# Patient Record
Sex: Female | Born: 1955 | Race: White | Hispanic: No | Smoking: Never smoker
Health system: Southern US, Community
[De-identification: ages and names within clinical notes are randomized; demographics above are authoritative.]

## PROBLEM LIST (undated history)

## (undated) DIAGNOSIS — I2510 Atherosclerotic heart disease of native coronary artery without angina pectoris: Secondary | ICD-10-CM

## (undated) DIAGNOSIS — K219 Gastro-esophageal reflux disease without esophagitis: Secondary | ICD-10-CM

## (undated) DIAGNOSIS — G8929 Other chronic pain: Secondary | ICD-10-CM

## (undated) DIAGNOSIS — E785 Hyperlipidemia, unspecified: Secondary | ICD-10-CM

## (undated) DIAGNOSIS — Z9889 Other specified postprocedural states: Secondary | ICD-10-CM

## (undated) DIAGNOSIS — R112 Nausea with vomiting, unspecified: Secondary | ICD-10-CM

## (undated) DIAGNOSIS — Z5189 Encounter for other specified aftercare: Secondary | ICD-10-CM

## (undated) DIAGNOSIS — I10 Essential (primary) hypertension: Secondary | ICD-10-CM

## (undated) DIAGNOSIS — M1990 Unspecified osteoarthritis, unspecified site: Secondary | ICD-10-CM

## (undated) DIAGNOSIS — M549 Dorsalgia, unspecified: Secondary | ICD-10-CM

## (undated) DIAGNOSIS — R51 Headache: Secondary | ICD-10-CM

## (undated) DIAGNOSIS — R0683 Snoring: Secondary | ICD-10-CM

## (undated) DIAGNOSIS — M4800 Spinal stenosis, site unspecified: Secondary | ICD-10-CM

## (undated) DIAGNOSIS — D649 Anemia, unspecified: Secondary | ICD-10-CM

## (undated) DIAGNOSIS — M353 Polymyalgia rheumatica: Secondary | ICD-10-CM

## (undated) DIAGNOSIS — M8580 Other specified disorders of bone density and structure, unspecified site: Secondary | ICD-10-CM

## (undated) HISTORY — DX: Unspecified osteoarthritis, unspecified site: M19.90

## (undated) HISTORY — DX: Spinal stenosis, site unspecified: M48.00

## (undated) HISTORY — PX: TUBAL LIGATION: SHX77

## (undated) HISTORY — DX: Hyperlipidemia, unspecified: E78.5

## (undated) HISTORY — DX: Essential (primary) hypertension: I10

## (undated) HISTORY — DX: Gastro-esophageal reflux disease without esophagitis: K21.9

## (undated) HISTORY — DX: Anemia, unspecified: D64.9

## (undated) HISTORY — DX: Encounter for other specified aftercare: Z51.89

## (undated) HISTORY — PX: BACK SURGERY: SHX140

## (undated) HISTORY — DX: Polymyalgia rheumatica: M35.3

## (undated) HISTORY — PX: APPENDECTOMY: SHX54

---

## 1999-10-18 ENCOUNTER — Encounter: Admission: RE | Admit: 1999-10-18 | Discharge: 1999-10-18 | Payer: Self-pay | Admitting: Family Medicine

## 1999-10-18 ENCOUNTER — Encounter: Payer: Self-pay | Admitting: Family Medicine

## 2000-01-17 ENCOUNTER — Encounter: Payer: Self-pay | Admitting: Family Medicine

## 2000-01-17 ENCOUNTER — Encounter: Admission: RE | Admit: 2000-01-17 | Discharge: 2000-01-17 | Payer: Self-pay | Admitting: Family Medicine

## 2000-12-27 ENCOUNTER — Encounter: Payer: Self-pay | Admitting: Family Medicine

## 2000-12-27 ENCOUNTER — Encounter: Admission: RE | Admit: 2000-12-27 | Discharge: 2000-12-27 | Payer: Self-pay | Admitting: Family Medicine

## 2003-06-17 ENCOUNTER — Other Ambulatory Visit: Admission: RE | Admit: 2003-06-17 | Discharge: 2003-06-17 | Payer: Self-pay | Admitting: Family Medicine

## 2004-09-06 ENCOUNTER — Other Ambulatory Visit: Admission: RE | Admit: 2004-09-06 | Discharge: 2004-09-06 | Payer: Self-pay | Admitting: Family Medicine

## 2005-01-30 ENCOUNTER — Encounter: Admission: RE | Admit: 2005-01-30 | Discharge: 2005-01-30 | Payer: Self-pay | Admitting: Gastroenterology

## 2006-06-21 ENCOUNTER — Ambulatory Visit (HOSPITAL_COMMUNITY): Admission: RE | Admit: 2006-06-21 | Discharge: 2006-06-21 | Payer: Self-pay | Admitting: Anesthesiology

## 2006-08-31 ENCOUNTER — Encounter: Admission: RE | Admit: 2006-08-31 | Discharge: 2006-08-31 | Payer: Self-pay | Admitting: Family Medicine

## 2006-09-20 ENCOUNTER — Other Ambulatory Visit: Admission: RE | Admit: 2006-09-20 | Discharge: 2006-09-20 | Payer: Self-pay | Admitting: Family Medicine

## 2006-09-23 ENCOUNTER — Encounter: Admission: RE | Admit: 2006-09-23 | Discharge: 2006-09-23 | Payer: Self-pay | Admitting: Family Medicine

## 2006-10-01 ENCOUNTER — Encounter: Admission: RE | Admit: 2006-10-01 | Discharge: 2006-10-01 | Payer: Self-pay | Admitting: Family Medicine

## 2006-10-20 ENCOUNTER — Emergency Department (HOSPITAL_COMMUNITY): Admission: EM | Admit: 2006-10-20 | Discharge: 2006-10-20 | Payer: Self-pay | Admitting: Emergency Medicine

## 2006-10-23 ENCOUNTER — Encounter: Admission: RE | Admit: 2006-10-23 | Discharge: 2006-10-23 | Payer: Self-pay | Admitting: Family Medicine

## 2006-12-23 ENCOUNTER — Encounter: Admission: RE | Admit: 2006-12-23 | Discharge: 2006-12-23 | Payer: Self-pay | Admitting: Family Medicine

## 2008-03-23 IMAGING — US US PELVIS COMPLETE MODIFY
1 series · 14 of 25 positions shown · non-contrast
Comparison: none

CLINICAL DATA: Right adnexal cyst.
 TRANSABDOMINAL AND TRANSVAGINAL PELVIC ULTRASOUND ? 10/01/06 AT 0386 HOURS:

[Series 1: us pelvis complete modify · 0.26mm/px · 14 of 66 slices shown]
[im 1/66]
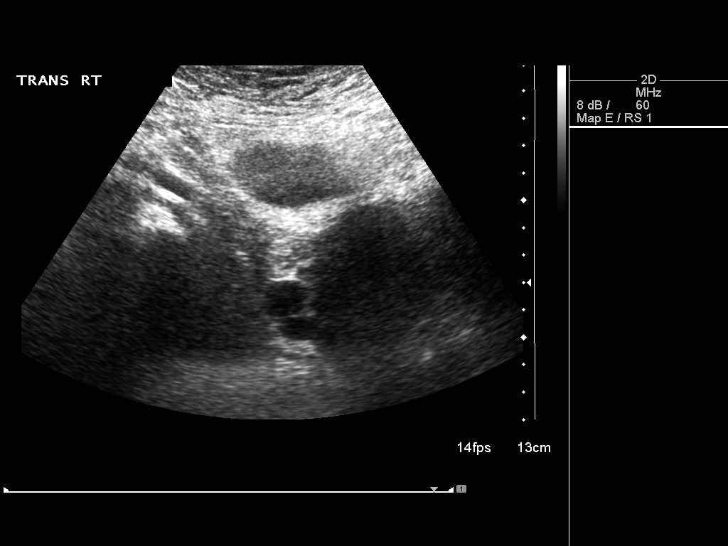
[im 6/66]
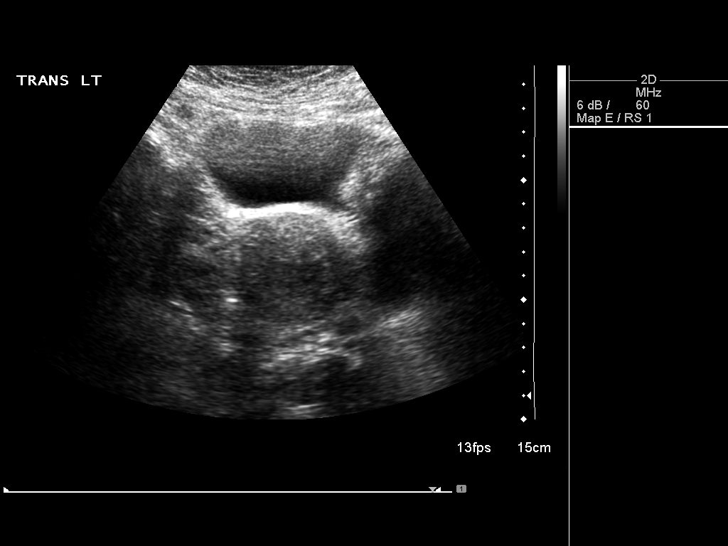
[im 11/66]
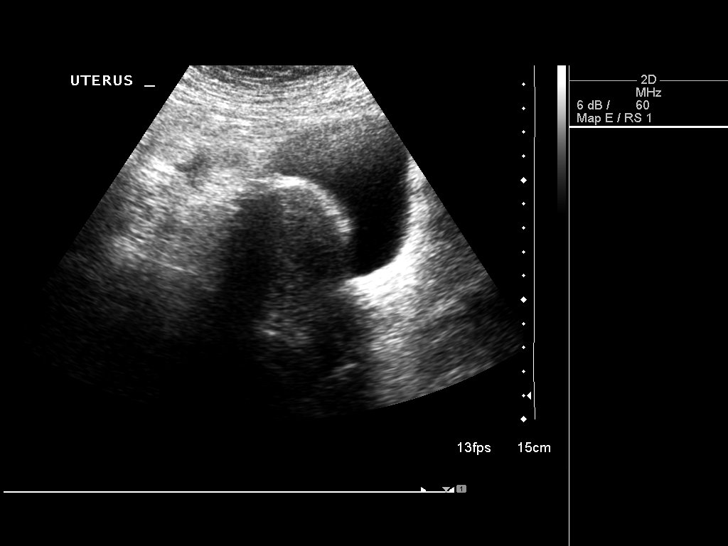
[im 17/66]
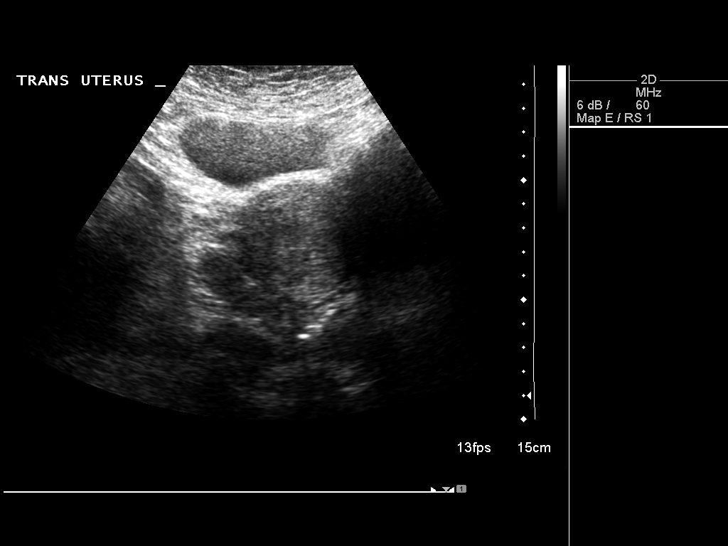
[im 22/66]
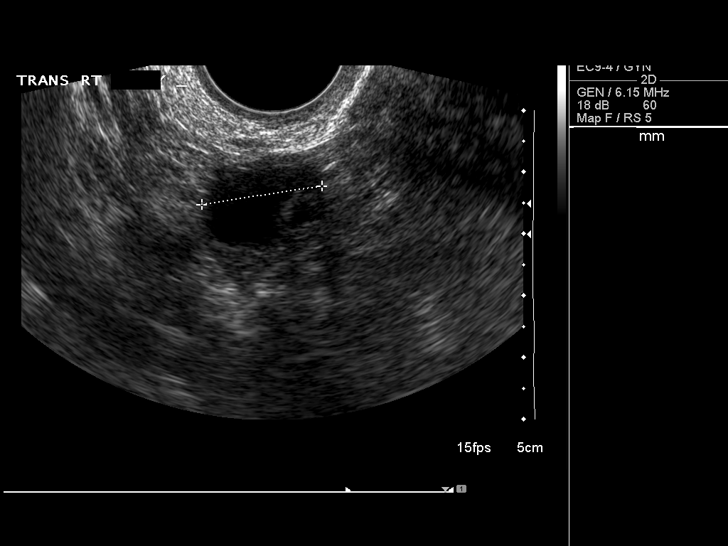
[im 25/66]
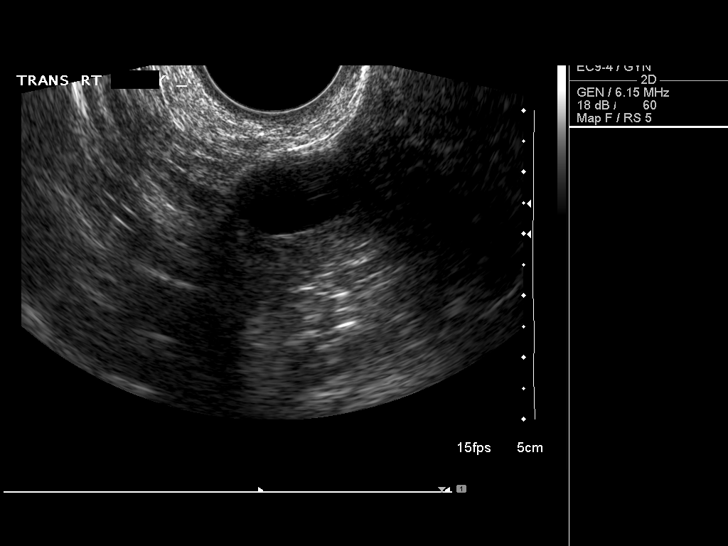
[im 30/66]
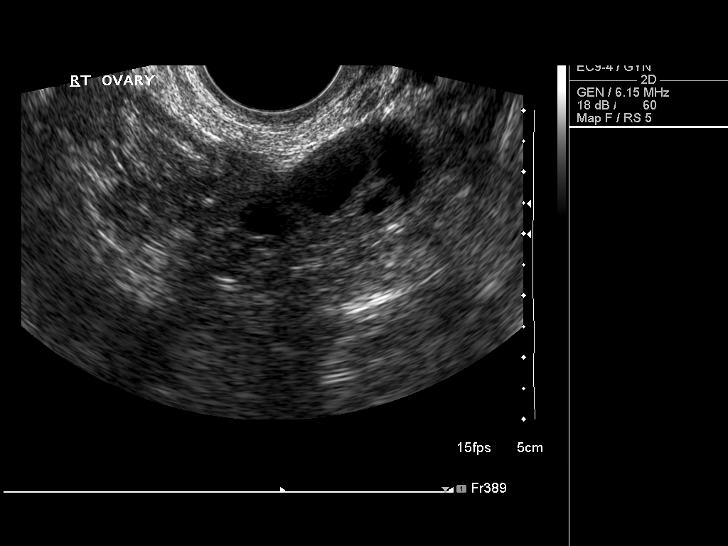
[im 36/66]
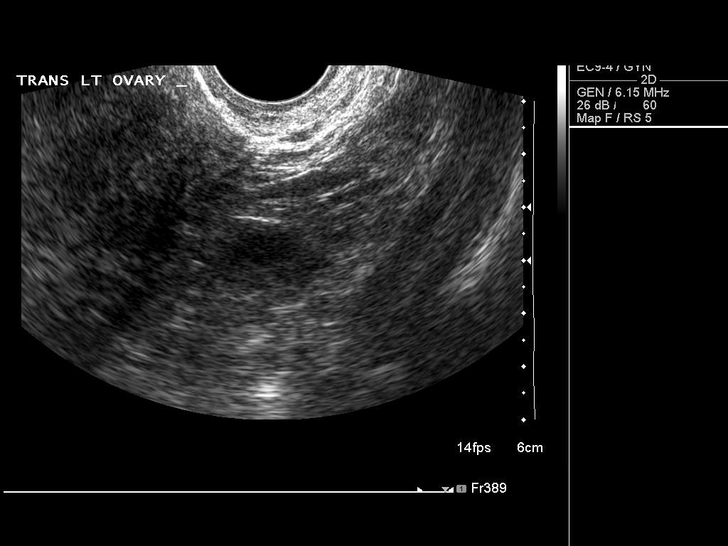
[im 41/66]
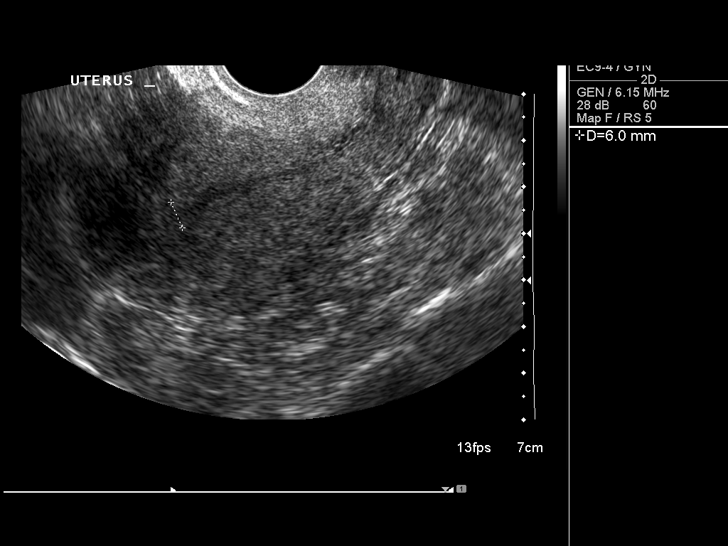
[im 44/66]
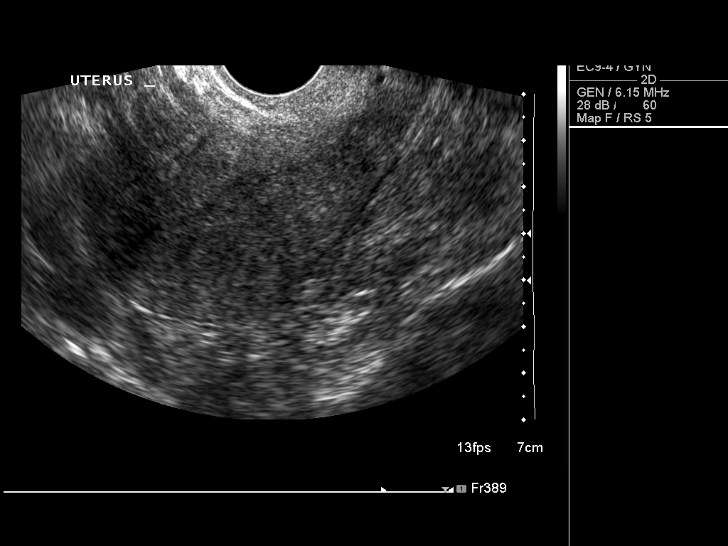
[im 49/66]
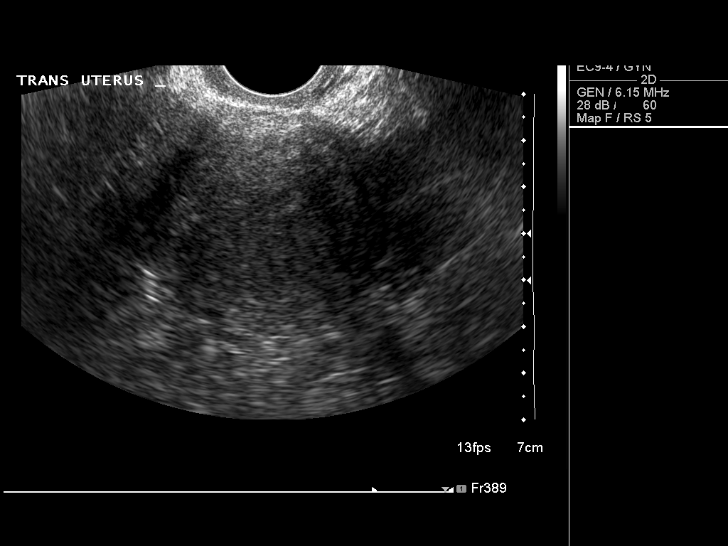
[im 55/66]
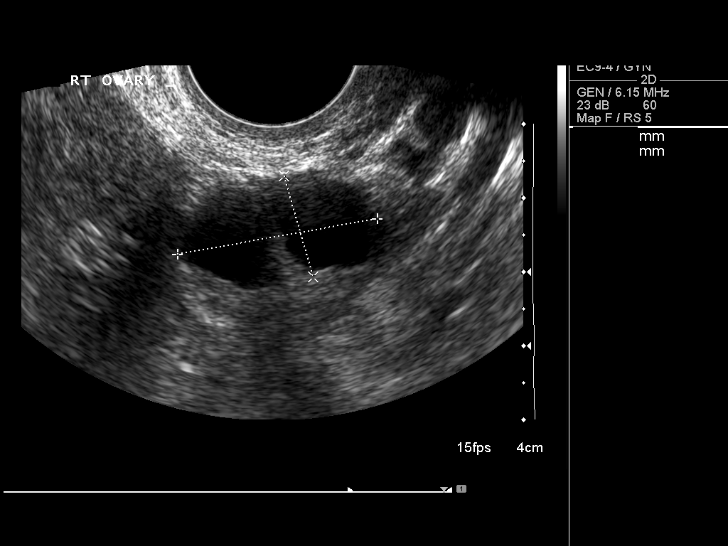
[im 60/66]
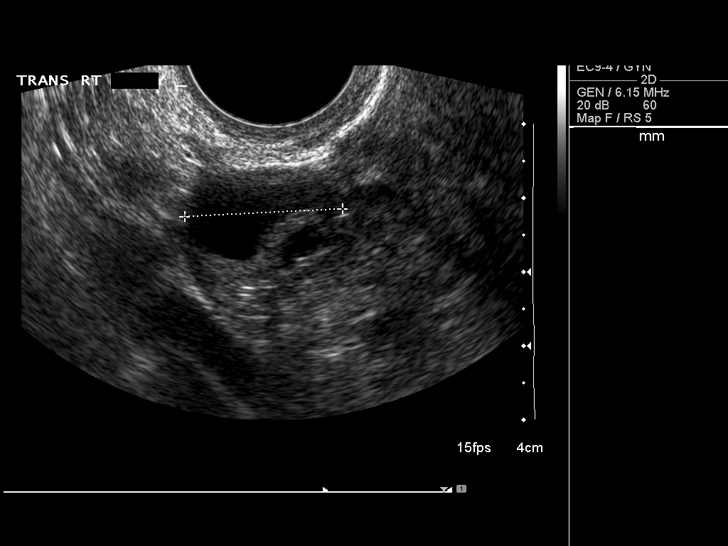
[im 66/66]
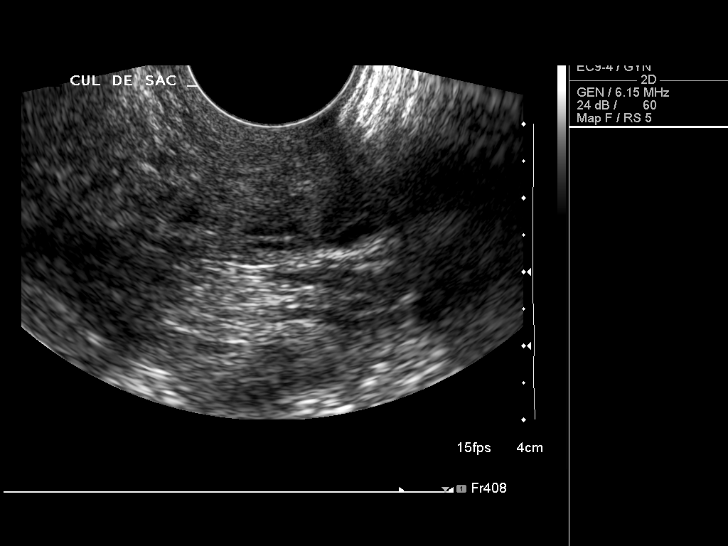

[14 of 25 positions shown; findings below may reference images not displayed]

FINDINGS: The uterus is 9.3 x 4.5 x 5.0 cm.  The endometrium is 6 mm in thickness and uniform.  The left ovary is unremarkable.  There is a septated 2.7 x 1.8 x 2.1 cm right ovarian cyst.  Trace free fluid is present.
IMPRESSION: Complex cyst in the right ovary.  Follow-up study is warranted until resolution.

## 2009-06-10 ENCOUNTER — Encounter: Admission: RE | Admit: 2009-06-10 | Discharge: 2009-06-10 | Payer: Self-pay | Admitting: Family Medicine

## 2010-01-03 ENCOUNTER — Encounter: Admission: RE | Admit: 2010-01-03 | Discharge: 2010-01-03 | Payer: Self-pay | Admitting: Rheumatology

## 2010-01-07 ENCOUNTER — Ambulatory Visit (HOSPITAL_BASED_OUTPATIENT_CLINIC_OR_DEPARTMENT_OTHER): Admission: RE | Admit: 2010-01-07 | Discharge: 2010-01-07 | Payer: Self-pay | Admitting: Family Medicine

## 2010-01-07 ENCOUNTER — Ambulatory Visit: Payer: Self-pay | Admitting: Diagnostic Radiology

## 2010-04-16 ENCOUNTER — Encounter: Payer: Self-pay | Admitting: Family Medicine

## 2010-10-25 DIAGNOSIS — IMO0001 Reserved for inherently not codable concepts without codable children: Secondary | ICD-10-CM

## 2010-10-25 DIAGNOSIS — Z5189 Encounter for other specified aftercare: Secondary | ICD-10-CM

## 2010-10-25 HISTORY — DX: Reserved for inherently not codable concepts without codable children: IMO0001

## 2010-10-25 HISTORY — DX: Encounter for other specified aftercare: Z51.89

## 2010-10-27 ENCOUNTER — Encounter (HOSPITAL_COMMUNITY): Payer: Federal, State, Local not specified - PPO | Attending: Family Medicine

## 2010-10-27 DIAGNOSIS — Z79899 Other long term (current) drug therapy: Secondary | ICD-10-CM | POA: Insufficient documentation

## 2010-10-27 DIAGNOSIS — M353 Polymyalgia rheumatica: Secondary | ICD-10-CM | POA: Insufficient documentation

## 2010-10-27 DIAGNOSIS — D509 Iron deficiency anemia, unspecified: Secondary | ICD-10-CM | POA: Insufficient documentation

## 2010-10-27 DIAGNOSIS — I10 Essential (primary) hypertension: Secondary | ICD-10-CM | POA: Insufficient documentation

## 2010-10-27 DIAGNOSIS — IMO0002 Reserved for concepts with insufficient information to code with codable children: Secondary | ICD-10-CM | POA: Insufficient documentation

## 2010-10-27 DIAGNOSIS — K219 Gastro-esophageal reflux disease without esophagitis: Secondary | ICD-10-CM | POA: Insufficient documentation

## 2010-11-08 ENCOUNTER — Other Ambulatory Visit: Payer: Self-pay | Admitting: Orthopedic Surgery

## 2010-11-08 ENCOUNTER — Other Ambulatory Visit (HOSPITAL_COMMUNITY): Payer: Self-pay | Admitting: Orthopedic Surgery

## 2010-11-08 ENCOUNTER — Ambulatory Visit (HOSPITAL_COMMUNITY)
Admission: RE | Admit: 2010-11-08 | Discharge: 2010-11-08 | Disposition: A | Discharge status: Home / Self Care | Payer: Federal, State, Local not specified - PPO | Source: Ambulatory Visit | Attending: Orthopedic Surgery | Admitting: Orthopedic Surgery

## 2010-11-08 ENCOUNTER — Encounter (HOSPITAL_COMMUNITY): Payer: Federal, State, Local not specified - PPO

## 2010-11-08 DIAGNOSIS — Z01811 Encounter for preprocedural respiratory examination: Secondary | ICD-10-CM

## 2010-11-08 DIAGNOSIS — Z79899 Other long term (current) drug therapy: Secondary | ICD-10-CM | POA: Insufficient documentation

## 2010-11-08 DIAGNOSIS — M169 Osteoarthritis of hip, unspecified: Secondary | ICD-10-CM | POA: Insufficient documentation

## 2010-11-08 DIAGNOSIS — Z01810 Encounter for preprocedural cardiovascular examination: Secondary | ICD-10-CM | POA: Insufficient documentation

## 2010-11-08 DIAGNOSIS — K219 Gastro-esophageal reflux disease without esophagitis: Secondary | ICD-10-CM | POA: Insufficient documentation

## 2010-11-08 DIAGNOSIS — I10 Essential (primary) hypertension: Secondary | ICD-10-CM | POA: Insufficient documentation

## 2010-11-08 DIAGNOSIS — M1610 Unilateral primary osteoarthritis, unspecified hip: Secondary | ICD-10-CM | POA: Insufficient documentation

## 2010-11-08 DIAGNOSIS — Z01812 Encounter for preprocedural laboratory examination: Secondary | ICD-10-CM | POA: Insufficient documentation

## 2010-11-08 DIAGNOSIS — Z01818 Encounter for other preprocedural examination: Secondary | ICD-10-CM | POA: Insufficient documentation

## 2010-11-08 DIAGNOSIS — M353 Polymyalgia rheumatica: Secondary | ICD-10-CM | POA: Insufficient documentation

## 2010-11-08 LAB — BASIC METABOLIC PANEL
BUN: 24 mg/dL — ABNORMAL HIGH (ref 6–23)
CO2: 27 mEq/L (ref 19–32)
Calcium: 11.4 mg/dL — ABNORMAL HIGH (ref 8.4–10.5)
Chloride: 102 mEq/L (ref 96–112)
Creatinine, Ser: 1.14 mg/dL — ABNORMAL HIGH (ref 0.50–1.10)
GFR calc Af Amer: 60 mL/min — ABNORMAL LOW (ref >60)
GFR calc non Af Amer: 49 mL/min — ABNORMAL LOW (ref >60)
Glucose, Bld: 109 mg/dL — ABNORMAL HIGH (ref 70–99)
Potassium: 5.4 mEq/L — ABNORMAL HIGH (ref 3.5–5.1)
Sodium: 139 mEq/L (ref 135–145)

## 2010-11-08 LAB — URINALYSIS, ROUTINE W REFLEX MICROSCOPIC
Bilirubin Urine: NEGATIVE
Glucose, UA: NEGATIVE mg/dL
Hgb urine dipstick: NEGATIVE
Ketones, ur: NEGATIVE mg/dL
Leukocytes, UA: NEGATIVE
Nitrite: NEGATIVE
Protein, ur: NEGATIVE mg/dL
Specific Gravity, Urine: 1.021 (ref 1.005–1.030)
Urobilinogen, UA: 0.2 mg/dL (ref 0.0–1.0)
pH: 6.5 (ref 5.0–8.0)

## 2010-11-08 LAB — DIFFERENTIAL
Basophils Absolute: 0 10*3/uL (ref 0.0–0.1)
Basophils Relative: 0 % (ref 0–1)
Eosinophils Absolute: 0 10*3/uL (ref 0.0–0.7)
Neutro Abs: 7 10*3/uL (ref 1.7–7.7)
Neutrophils Relative %: 71 % (ref 43–77)

## 2010-11-08 LAB — PROTIME-INR: INR: 0.98 (ref 0.00–1.49)

## 2010-11-08 LAB — CBC
Hemoglobin: 10.4 g/dL — ABNORMAL LOW (ref 12.0–15.0)
MCH: 31 pg (ref 26.0–34.0)
MCHC: 32.9 g/dL (ref 30.0–36.0)
Platelets: 375 10*3/uL (ref 150–400)
RDW: 13.1 % (ref 11.5–15.5)

## 2010-11-08 LAB — ABO/RH: ABO/RH(D): B POS

## 2010-11-08 LAB — APTT: aPTT: 29 seconds (ref 24–37)

## 2010-11-14 ENCOUNTER — Inpatient Hospital Stay (HOSPITAL_COMMUNITY): Payer: Federal, State, Local not specified - PPO

## 2010-11-14 ENCOUNTER — Inpatient Hospital Stay (HOSPITAL_COMMUNITY)
Admission: RE | Admit: 2010-11-14 | Discharge: 2010-11-16 | DRG: 818 | Disposition: A | Discharge status: Home Health | Payer: Federal, State, Local not specified - PPO | Source: Ambulatory Visit | Attending: Orthopedic Surgery | Admitting: Orthopedic Surgery

## 2010-11-14 DIAGNOSIS — IMO0002 Reserved for concepts with insufficient information to code with codable children: Secondary | ICD-10-CM

## 2010-11-14 DIAGNOSIS — Z9851 Tubal ligation status: Secondary | ICD-10-CM

## 2010-11-14 DIAGNOSIS — M169 Osteoarthritis of hip, unspecified: Principal | ICD-10-CM | POA: Diagnosis present

## 2010-11-14 DIAGNOSIS — I10 Essential (primary) hypertension: Secondary | ICD-10-CM | POA: Diagnosis present

## 2010-11-14 DIAGNOSIS — Z79899 Other long term (current) drug therapy: Secondary | ICD-10-CM

## 2010-11-14 DIAGNOSIS — M353 Polymyalgia rheumatica: Secondary | ICD-10-CM | POA: Diagnosis present

## 2010-11-14 DIAGNOSIS — K219 Gastro-esophageal reflux disease without esophagitis: Secondary | ICD-10-CM | POA: Diagnosis present

## 2010-11-14 DIAGNOSIS — M1610 Unilateral primary osteoarthritis, unspecified hip: Principal | ICD-10-CM | POA: Diagnosis present

## 2010-11-14 DIAGNOSIS — Z01812 Encounter for preprocedural laboratory examination: Secondary | ICD-10-CM

## 2010-11-14 DIAGNOSIS — Z01810 Encounter for preprocedural cardiovascular examination: Secondary | ICD-10-CM

## 2010-11-14 DIAGNOSIS — D649 Anemia, unspecified: Secondary | ICD-10-CM | POA: Diagnosis present

## 2010-11-14 HISTORY — PX: JOINT REPLACEMENT: SHX530

## 2010-11-15 LAB — CBC
HCT: 21.4 % — ABNORMAL LOW (ref 36.0–46.0)
Hemoglobin: 7.2 g/dL — ABNORMAL LOW (ref 12.0–15.0)
MCV: 93.4 fL (ref 78.0–100.0)
WBC: 11.3 10*3/uL — ABNORMAL HIGH (ref 4.0–10.5)

## 2010-11-15 LAB — BASIC METABOLIC PANEL
BUN: 13 mg/dL (ref 6–23)
Chloride: 106 mEq/L (ref 96–112)
Creatinine, Ser: 0.69 mg/dL (ref 0.50–1.10)
Glucose, Bld: 150 mg/dL — ABNORMAL HIGH (ref 70–99)
Potassium: 4.2 mEq/L (ref 3.5–5.1)

## 2010-11-16 LAB — TYPE AND SCREEN
ABO/RH(D): B POS
Unit division: 0

## 2010-11-16 LAB — BASIC METABOLIC PANEL
BUN: 12 mg/dL (ref 6–23)
Calcium: 9.6 mg/dL (ref 8.4–10.5)
Creatinine, Ser: 0.69 mg/dL (ref 0.50–1.10)
GFR calc Af Amer: >60 mL/min (ref >60)

## 2010-11-16 LAB — CBC
HCT: 27.8 % — ABNORMAL LOW (ref 36.0–46.0)
MCH: 29.9 pg (ref 26.0–34.0)
MCV: 89.4 fL (ref 78.0–100.0)
Platelets: 217 10*3/uL (ref 150–400)
RDW: 17.4 % — ABNORMAL HIGH (ref 11.5–15.5)

## 2010-11-17 NOTE — Op Note (Signed)
NAMEWRENLEY, [name]                   ACCOUNT NO.:  [id number]  MEDICAL RECORD NO.:  [id number]  LOCATION:  1601                         FACILITY:  [facility]  PHYSICIAN:  Madlyn Frankel. Charlann Boxer, M.D.  DATE OF BIRTH:  09/11/1955  DATE OF PROCEDURE:  11/14/2010 DATE OF DISCHARGE:                              OPERATIVE REPORT   PREOPERATIVE DIAGNOSIS:  Right hip osteoarthritis.  POSTOPERATIVE DIAGNOSIS:  Right hip osteoarthritis.  PROCEDURE:  Right total hip replacement through an anterior approach.  COMPONENTS USED:  DePuy hip system size 50 Pinnacle cup, single cancellous screw, 32 +4 neutral AltrX liner, and size 1 high Tri-Lock stem with a 32 +5 Delta ceramic ball.  SURGEON:  Madlyn Frankel. Charlann Boxer, MD  ASSISTANT:  Lanney Gins, PA-C  ANESTHESIA:  General.  BLOOD LOSS:  About 650 cc.  DRAINS:  One Hemovac.  COMPLICATIONS:  None apparent.  INDICATIONS FOR PROCEDURE:  Ms. Pollman is a 55 year old female who presented to the office for evaluation of right hip pain.  She had been having problems for a very long time.  Radiographs revealed end-stage bone on bone arthritis with significant collapse of femoral head, consistent with more advanced degenerative changes with both avascular process.  After reviewing with her surgical options of the hip arthroplasty, risks of infection, DVT, component failure, need for revision surgery were discussed as well as the current topics of posterior versus anterior approach.  Following this discussion, she wished at this point to proceed with a right total hip through an anterior approach.  Consent was obtained for benefit of pain relief.  PROCEDURE IN DETAIL:  The patient was brought to operative theater. Once adequate anesthesia, preoperative antibiotics, Ancef administered, she was positioned supine on the OSI Hana table with the right arm abducted across her chest and padded.  Fluoroscopy was then brought in to confirm anatomy and position.   The right hip was then pre-draped out with plastic drape.  The right hip was then prepped and draped in sterile fashion using shower-curtain technique, identifying the anterior-superior iliac spine location of incision.  Time-out was performed identifying the patient, planned procedure, and extremity.  Lateral based incision was made 2 cm lateral distal to the anterior-superior iliac spine.  Sharp dissection was carried to the fascia with tensor fascia lata.  Soft tissue retractor was placed.  The fascia was then incised and the muscle belly swept laterally and superior neck retractor placed.  The inferior neck was identified.  At this point, identified that she had a significantly affected degenerative joint as she has significant adherent joint capsule.  I basically excised the joint at this point of the capsule as much anteriorly as possible to evaluate the joint.  Flaps were not was saved.  With this, the retractors were placed around the neck intracapsularly. I then pulled traction on the hip and locked it down.  Fluoroscopy was used to confirm location of a neck cut.  The neck osteotomy was made and femoral head removed.  At this point, attention was directed to further debridement the anterior capsule and labrum and soft tissues to allow for exposure purposes.  The retractors were placed posterior and one  anterior.  I began reaming with a 43 reamer, reamed up to 49 reamer with good bony bed preparation.  The last 2 reamers done under fluoroscopic imaging to confirm depth of reaming and location and position.  A final 50 Pinnacle cup was chosen and with a curved impactor was impacted under fluoroscopic imaging to maintain the correct level of abduction.  I could palpate the anterior rim anteriorly, confirming the level of anteversion.  At this point, the single cancellous screw was placed into the ilium to support the initial scratch fit.  Hole eliminator was placed and a  final 32 +4 neutral AltrX liner impacted.  Attention was now directed to the femur.  The lateral hook was applied. The femur was rotated to 100 degrees as I removed some of the inferior capsular tissue further.  I then went ahead and placed a retractor posteriorly along the trochanter and the leg was then extended and abducted.  Further capsular tissues removed off the posterior trochanter.  I then used a box osteotome and opened up the proximal femur.  I began broaching with a starting broach and passed this by hand and then broached up to a 0 and then evaluated the position of the component in the femur in AP and lateral planes.  Once I was satisfied with orientation, I broached up to 1 and into a size 2 broach.  Then 2 broach initially sat at the level of my neck cut when I did a trial reduction of high offset neck.  There appeared to be good fit and filled the prosthesis, however, this operative leg at this point was longer than left leg, the nonoperative side radiographically.  At this point, we dislocated the hip, replaced the retractors.  I removed the 2 broach and impacted the 1 broach until it was well seated. We used a calcar planer to remove some of the medial remaining neck.  A trial reduction was now carried out with 1 standard at 32 +1 ball.  With this, the leg lengths now appeared to be very equal.  For this reason, the trial components were removed.  The final 1 high Tri-Lock stem was chosen based on the level of my trial reduction where the stem sat, I chose to use a +5 ball to restore length and help of the offset.  The final 32 +5 Delta ceramic ball was chosen and impacted onto clean and dry trunnion and the hip was reduced.  The hip had been irrigated throughout the case and again at this point. A medium Hemovac drain was placed deep.  The fascia of the tensor fascia lata muscle was reapproximated using #1 Vicryl.  The remainder of the wound was closed with  2-0 Vicryl and running 4-0 Monocryl.  The hip was cleaned, dried, and dressed sterilely using Dermabond and Aquacel dressing. Drain site dressed separately.  The patient was brought to recovery room in stable condition tolerating the procedure well.     Madlyn Frankel Charlann Boxer, M.D.     MDO/MEDQ  D:  11/14/2010  T:  11/15/2010  Job:  [id number]  Electronically Signed by Durene Romans M.D. on 11/17/2010 07:57:55 AM

## 2010-11-20 NOTE — H&P (Signed)
NAMELYLY, CANIZALES                   ACCOUNT NO.:  [id number]  MEDICAL RECORD NO.:  [id number]  LOCATION:                                 FACILITY:  PHYSICIAN:  Madlyn Frankel. Charlann Boxer, M.D.  DATE OF BIRTH:  05/21/1955  DATE OF ADMISSION: DATE OF DISCHARGE:                             HISTORY & PHYSICAL   DATE OF SURGERY:  November 21, 2010  ADMISSION DIAGNOSIS:  Right hip osteoarthritis.  HISTORY OF PRESENT ILLNESS:  This is a 55 year old lady with a history of osteoarthritis of her right hip with failure of conserve treatment to alleviate her symptoms.  After discussion of treatment, benefits, risks and options, the patient is now scheduled for anterior right total hip arthroplasty.  Note that the patient is a candidate for tranexamic acid and will receive that per protocol.  Also note that she takes 10 mg of prednisone daily for her polymyalgia rheumatica and will need a booster dose of cortisone preoperatively.  She is given her home medications of aspirin, Robaxin, iron, MiraLax and Colace for postop and she is to stop her Flexeril postoperatively and take the Robaxin instead so she will not get as much sedation when she is taking pain medications.  Also note that she does have a strong family history of sleep apnea.  She herself does not have sleep apnea, but we should put her on a continuous pulse ox postoperatively as a precaution.  PAST MEDICAL HISTORY:  Drug allergies to [facility] with chest pain.  CURRENT MEDICATIONS: 1. Lisinopril. 2. Hydrochlorothiazide 10/12.5 one q.a.m. 3. Prevacid 30 mg q.a.m. 4. Norco q.6 p.r.n. 5. Flexeril 10 mg q.8 h. p.r.n. 6. Vitamin D3 2000 units daily. 7. Magnesium 400 mg daily. 8. Flaxseed oil 1200 mg daily. 9. Prednisone 10 mg daily.  SERIOUS MEDICAL ILLNESS:  Include, 1. Hypertension, . 2. Reflux. 3. Polymyalgia rheumatica.  PREVIOUS SURGERIES:  Include, 1. Appendectomy. 2. Tubal ligation and back surgery x2.  SERIOUS FAMILY  HISTORY:  Positive for cancer, coronary artery disease.  SOCIAL HISTORY:  The patient is widowed.  She is retired.  She does not smoke and does not drink.  She will be going home after surgery and staying with her daughter.  REVIEW OF SYSTEMS:  CENTRAL NERVOUS SYSTEM: Positive for occasional dizziness and tinnitus.  PULMONARY:  Negative for shortness breath, PND, and orthopnea.  CARDIOVASCULAR:  Negative for chest pain or palpitation. GI: Negative for ulcers or hepatitis.  Positive for heartburn and reflux.  GU: Negative for urinary tract difficulties other than occasional incontinence.  MUSCULOSKELETAL:  Positives in HPI.  PHYSICAL EXAM:  VITAL SIGNS:  BP 100/70, respirations 16, pulse 80 and regular. GENERAL APPEARANCE:  This is a well-developed, well-nourished lady in no acute distress. HEENT:  Head normocephalic.  Nose patent.  Ears patent.  Pupils equal, round, and reactive light.  Throat without injection. NECK:  Supple without adenopathy.  Carotids 2+ without bruit. CHEST:  Clear to auscultation.  No rales or rhonchi.  Respirations 16. HEART:  Regular rate and rhythm at 80 beats without murmur. ABDOMEN:  Soft, active bowel sounds.  No mass, organomegaly. NEUROLOGIC:  The patient alert and oriented to  time, place and person. Cranial nerves II-XII grossly intact. EXTREMITY:  Shows right hip with decreased range of motion with pain to any internal external rotation.  Neurovascular status intact.  IMPRESSION:  Right hip osteoarthritis.  PLAN:  Right hip total hip arthroplasty by anterior approach.     Jaquelyn Bitter. Chabon, P.A.   ______________________________ Madlyn Frankel Charlann Boxer, M.D.    SJC/MEDQ  D:  11/08/2010  T:  11/09/2010  Job:  [id number]  Electronically Signed by Jodene Nam P.A. on 11/19/2010 08:46:48 AM Electronically Signed by Durene Romans M.D. on 11/20/2010 07:42:39 AM

## 2010-11-20 NOTE — Discharge Summary (Signed)
[name], [name]                   ACCOUNT NO.:  [id number]  MEDICAL RECORD NO.:  [id number]  LOCATION:  1601                         FACILITY:  [facility]  PHYSICIAN:  Amanda Frankel. Charlann Mckenzie, M.D.  DATE OF BIRTH:  [birth year]  DATE OF ADMISSION:  11/14/2010 DATE OF DISCHARGE:  11/16/2010                              DISCHARGE SUMMARY   PROCEDURE:  Right total hip replacement.  ADMITTING DIAGNOSIS:  Right hip osteoarthritis.  DISCHARGE DIAGNOSES: 1. Status post right total hip arthroplasty. 2. Hypertension. 3. Reflux. 4. Polymyalgia rheumatica.  HISTORY OF PRESENT ILLNESS:  The patient is a 55 year old lady with a history of osteoarthritis of her right hip with failure of conservative treatment to alleviate her symptoms.  X-rays in the clinic did show arthritic changes within the right hip.  Various options were discussed with the patient.  The patient wishes to proceed with surgery.  Risks, benefits, and expectations of procedure were discussed with the patient. The patient understands risks, benefits, and expectations, and wishes to proceed with a right total hip arthroplasty per Dr. Charlann Mckenzie.  HOSPITAL COURSE:  The patient underwent the above-stated procedure on November 14, 2010.  The patient tolerated the procedure well and was brought to the recovery room in good condition and subsequently to the floor.  On  postop day #1, November 15, 2010, the patient doing well, no incidences, pain controlled well, afebrile, vital signs stable. Hemoglobin and hematocrit was 7.2/21.4.  Dressings good.  Clean, dry, and intact.  Hemovac was removed.  The patient was transfused with 2 units of packed red blood cells.  Postop day #2, November 16, 2010, the patient doing well, on no incidences.  The patient's pain was still well controlled, afebrile, vital signs stable.  Hemoglobin and hematocrit is 9.3/27.8.  The patient distally neurovascularly intact.  Dressing was good.  The patient was seen by  Physical Therapy, OT.  The patient was felt to be doing well enough to be discharged home today with home health PT.  DISCHARGE CONDITION:  Good.  DISCHARGE INSTRUCTIONS:  The patient will be discharged home with home health PT.  The patient will be weightbearing as tolerated.  The patient will maintain surgical dressing for about 7 days after which time she will be replaced with gauze and tape.  The patient should keep the area dry and clean until followup.  The patient will follow up with Dr. Charlann Mckenzie, [facility] in 2 weeks.  The patient is to call with any questions or concerns.  DISCHARGE MEDICATIONS: 1. Enteric-coated aspirin 325 mg 1 p.o. b.i.d. for 4 weeks. 2. Colace 100 mg 1 p.o. b.i.d. 3. Iron sulfate 325 mg 1 p.o. t.i.d. 4. Norco 7.5/325 mg 1 to 2 p.o. q.4-6 h. p.r.n. pain. 5. MiraLax 17 g 1 p.o. daily p.r.n. constipation. 6. Robaxin 500 mg 1 p.o. q.6 h. p.r.n. muscle spasms. 7. Benadryl 25 mg 2 capsules by mouth p.o. q.h.s. p.r.n. insomnia. 8. Flaxseed oil 1200 g daily. 9. Lansoprazole 30 mg 1 p.o. q.a.m.10.Lisinopril/HCTZ 10/12.5 mg 1 p.o. q.a.m. 11.Magnesium oxide 400 mg 1 p.o. daily 12.Prednisone 10 mg 1 p.o. q.a.m. 13.Zolpidem 10 mg 1 p.o. q.h.s. p.r.n. insomnia. 14.Vitamin D3  2000 units 1 p.o. daily.    ______________________________ Amanda Gins, PA   ______________________________ Amanda Frankel. Charlann Mckenzie, M.D.    MB/MEDQ  D:  11/16/2010  T:  11/16/2010  Job:  [id number]  Electronically Signed by Amanda Gins PA on 11/17/2010 09:27:50 AM Electronically Signed by Durene Romans M.D. on 11/20/2010 07:42:45 AM

## 2011-02-22 ENCOUNTER — Other Ambulatory Visit: Payer: Self-pay | Admitting: Family Medicine

## 2011-02-22 DIAGNOSIS — Z1231 Encounter for screening mammogram for malignant neoplasm of breast: Secondary | ICD-10-CM

## 2011-03-21 ENCOUNTER — Ambulatory Visit
Admission: RE | Admit: 2011-03-21 | Discharge: 2011-03-21 | Disposition: A | Discharge status: Home / Self Care | Payer: Federal, State, Local not specified - PPO | Source: Ambulatory Visit | Attending: Family Medicine | Admitting: Family Medicine

## 2011-03-21 DIAGNOSIS — Z1231 Encounter for screening mammogram for malignant neoplasm of breast: Secondary | ICD-10-CM

## 2011-07-04 ENCOUNTER — Encounter (INDEPENDENT_AMBULATORY_CARE_PROVIDER_SITE_OTHER): Payer: Self-pay | Admitting: General Surgery

## 2011-07-05 ENCOUNTER — Encounter (INDEPENDENT_AMBULATORY_CARE_PROVIDER_SITE_OTHER): Payer: Self-pay | Admitting: General Surgery

## 2011-07-05 ENCOUNTER — Ambulatory Visit (INDEPENDENT_AMBULATORY_CARE_PROVIDER_SITE_OTHER): Payer: Federal, State, Local not specified - PPO | Admitting: General Surgery

## 2011-07-05 VITALS — BP 112/78 | HR 80 | Temp 97.6°F | Resp 16 | Ht 60.0 in | Wt 198.4 lb

## 2011-07-05 DIAGNOSIS — K8020 Calculus of gallbladder without cholecystitis without obstruction: Secondary | ICD-10-CM | POA: Insufficient documentation

## 2011-07-05 NOTE — Progress Notes (Signed)
Patient ID: [name], female   DOB: September 20, 1955, 56 y.o.   MRN: [id number]  Chief Complaint  Patient presents with  . Abdominal Pain    new pt- eval gallstones    HPI [name] is a 56 y.o. female.  She is referred by Dr. Marinda Elk for evaluation of symptomatic gallstones.  This patient has polymyalgia rheumatica on low-dose prednisone, osteoarthritis, spinal stenosis, hypertension, DJD and obesity.  For at least one year she's been having intermittent attacks of right upper quadrant pain. This is clearly brought on by meals. Nausea is minimal. She is constipated as she takes Norco chronically. No diarrhea. No fever. She has no history of liver disease jaundice or hepatitis. She had a total hip replacement in August 2012 and recovered very nicely from that.  Gallbladder ultrasound was performed at [facility] ultrasound. This shows multiple mobile gallstones. The largest is 8 mm. There is no inflammatory change. There is a little tenderness with the ultrasound probe. The liver looked normal.  She is basically asymptomatic today. She is hopeful that she will be able to come off of prednisone at some point. HPI  Past Medical History  Diagnosis Date  . Arthritis   . Hypertension   . Polymyalgia rheumatica   . Polymyalgia rheumatica   . Spinal stenosis   . Anemia   . Blood transfusion   . GERD (gastroesophageal reflux disease)   . Hyperlipidemia   . Gallstones     Past Surgical History  Procedure Date  . Appendectomy   . Tubal ligation   . Joint replacement 10/2010    R hip replacement  . Back surgery 1990, 1992    Family History  Problem Relation Age of Onset  . Kidney disease Mother   . Heart disease Father   . Cancer Sister     breast, bladder, cervical, brain  . Cancer Brother     lung    Social History History  Substance Use Topics  . Smoking status: Never Smoker   . Smokeless tobacco: Not on file  . Alcohol Use: No    Allergies  Allergen Reactions  .  Imitrex (Sumatriptan Base)     Current Outpatient Prescriptions  Medication Sig Dispense Refill  . alendronate (FOSAMAX) 70 MG tablet Take 70 mg by mouth every 7 (seven) days. Take with a full glass of water on an empty stomach.      Marland Kitchen atorvastatin (LIPITOR) 20 MG tablet Take 20 mg by mouth daily.      . cholecalciferol (VITAMIN D) 1000 UNITS tablet Take 1,000 Units by mouth 2 (two) times daily.      . cyclobenzaprine (FLEXERIL) 10 MG tablet Take 10 mg by mouth 3 (three) times daily as needed.      Marland Kitchen HYDROcodone-acetaminophen (NORCO) 7.5-325 MG per tablet Take 1 tablet by mouth every 6 (six) hours as needed.      . lansoprazole (PREVACID) 30 MG capsule Take 30 mg by mouth daily.      Marland Kitchen lisinopril-hydrochlorothiazide (PRINZIDE,ZESTORETIC) 10-12.5 MG per tablet Take 1 tablet by mouth daily.      . magnesium oxide (MAG-OX) 400 MG tablet Take 400 mg by mouth daily.      . predniSONE (DELTASONE) 10 MG tablet Take 10 mg by mouth every Tuesday, Thursday, Saturday, and Sunday.        Review of Systems Review of Systems  Constitutional: Negative for fever, chills and unexpected weight change.  HENT: Negative for hearing loss, congestion, sore  throat, trouble swallowing and voice change.   Eyes: Negative for visual disturbance.  Respiratory: Negative for cough and wheezing.   Cardiovascular: Negative for chest pain, palpitations and leg swelling.  Gastrointestinal: Positive for nausea, abdominal pain and constipation. Negative for vomiting, diarrhea, blood in stool, abdominal distention and anal bleeding.  Genitourinary: Negative for hematuria, vaginal bleeding and difficulty urinating.  Musculoskeletal: Positive for myalgias and arthralgias.  Skin: Negative for rash and wound.  Neurological: Negative for seizures, syncope and headaches.  Hematological: Negative for adenopathy. Does not bruise/bleed easily.  Psychiatric/Behavioral: Negative for confusion.    Blood pressure 112/78, pulse 80,  temperature 97.6 F (36.4 C), temperature source Temporal, resp. rate 16, height 5' (1.524 m), weight 198 lb 6.4 oz (89.994 kg).  Physical Exam Physical Exam  Constitutional: She is oriented to person, place, and time. She appears well-developed and well-nourished. No distress.  HENT:  Head: Normocephalic and atraumatic.  Nose: Nose normal.  Mouth/Throat: No oropharyngeal exudate.  Eyes: Conjunctivae and EOM are normal. Pupils are equal, round, and reactive to light. Left eye exhibits no discharge. No scleral icterus.  Neck: Neck supple. No JVD present. No tracheal deviation present. No thyromegaly present.  Cardiovascular: Normal rate, regular rhythm, normal heart sounds and intact distal pulses.   No murmur heard. Pulmonary/Chest: Effort normal and breath sounds normal. No respiratory distress. She has no wheezes. She has no rales. She exhibits no tenderness.  Abdominal: Soft. Bowel sounds are normal. She exhibits no distension and no mass. There is no tenderness. There is no rebound and no guarding.    Musculoskeletal: She exhibits no edema and no tenderness.  Lymphadenopathy:    She has no cervical adenopathy.  Neurological: She is alert and oriented to person, place, and time. She exhibits normal muscle tone. Coordination normal.  Skin: Skin is warm. No rash noted. She is not diaphoretic. No erythema. No pallor.  Psychiatric: She has a normal mood and affect. Her behavior is normal. Judgment and thought content normal.    Data Reviewed I reviewed her ultrasound report, Dr. Pablo Lawrence office notes, and lab work.  Assessment    Chronic cholecystitis with cholelithiasis. She has fairly typical recurrent biliary colic. She would benefit from cholecystectomy to control her symptoms and prevent complications in the future.  Polymyalgia rheumatica on low-dose prednisone  Obesity  Degenerative joint disease  Hypertension  Hyperlipidemia  Spinal stenosis.  GERD. Controlled with  Prevacid.    Plan    Scheduled for laparoscopic cholecystectomy with cholangiogram, possible open.  We will give one dose of steroids in the OR as a stress dose.  I have discussed the indications and details of cholecystectomy with her. Techniques have been outlined. Risks have been discussed, including but not limited to bleeding, infection, conversion to open laparotomy, bile leak, injury to adjacent organs such as bile duct or intestine, cardiac, pulmonary, thromboembolic problems, wound healing problems, and other unforseen  issues. She understands these issues. Her questions are answered. She agrees with this plan.       Angelia Mould. Derrell Lolling, M.D., [facility] General and Minimally invasive Surgery Breast and Colorectal Surgery Office:   [phone] Pager:   [phone]  07/05/2011, 10:06 AM

## 2011-07-05 NOTE — Patient Instructions (Signed)
You will be scheduled for a laparoscopic cholecystectomy with cholangiogram, possible open.  I ask you to call Dr. Foy Guadalajara to see if he wants to modify any of your medications preop. Otherwise I did not need medical clearance for your surgery.    Laparoscopic Cholecystectomy Laparoscopic cholecystectomy is surgery to remove the gallbladder. The gallbladder is located slightly to the right of center in the abdomen, behind the liver. It is a concentrating and storage sac for the bile produced in the liver. Bile aids in the digestion and absorption of fats. Gallbladder disease (cholecystitis) is an inflammation of your gallbladder. This condition is usually caused by a buildup of gallstones (cholelithiasis) in your gallbladder. Gallstones can block the flow of bile, resulting in inflammation and pain. In severe cases, emergency surgery may be required. When emergency surgery is not required, you will have time to prepare for the procedure. Laparoscopic surgery is an alternative to open surgery. Laparoscopic surgery usually has a shorter recovery time. Your common bile duct may also need to be examined and explored. Your caregiver will discuss this with you if he or she feels this should be done. If stones are found in the common bile duct, they may be removed. LET YOUR CAREGIVER KNOW ABOUT:  Allergies to food or medicine.   Medicines taken, including vitamins, herbs, eyedrops, over-the-counter medicines, and creams.   Use of steroids (by mouth or creams).   Previous problems with anesthetics or numbing medicines.   History of bleeding problems or blood clots.   Previous surgery.   Other health problems, including diabetes and kidney problems.   Possibility of pregnancy, if this applies.  RISKS AND COMPLICATIONS All surgery is associated with risks. Some problems that may occur following this procedure include:  Infection.   Damage to the common bile duct, nerves, arteries, veins, or other  internal organs such as the stomach or intestines.   Bleeding.   A stone may remain in the common bile duct.  BEFORE THE PROCEDURE  Do not take aspirin for 3 days prior to surgery or blood thinners for 1 week prior to surgery.   Do not eat or drink anything after midnight the night before surgery.   Let your caregiver know if you develop a cold or other infectious problem prior to surgery.   You should be present 60 minutes before the procedure or as directed.  PROCEDURE  You will be given medicine that makes you sleep (general anesthetic). When you are asleep, your surgeon will make several small cuts (incisions) in your abdomen. One of these incisions is used to insert a small, lighted scope (laparoscope) into the abdomen. The laparoscope helps the surgeon see into your abdomen. Carbon dioxide gas will be pumped into your abdomen. The gas allows more room for the surgeon to perform your surgery. Other operating instruments are inserted through the other incisions. Laparoscopic procedures may not be appropriate when:  There is major scarring from previous surgery.   The gallbladder is extremely inflamed.   There are bleeding disorders or unexpected cirrhosis of the liver.   A pregnancy is near term.   Other conditions make the laparoscopic procedure impossible.  If your surgeon feels it is not safe to continue with a laparoscopic procedure, he or she will perform an open abdominal procedure. In this case, the surgeon will make an incision to open the abdomen. This gives the surgeon a larger view and field to work within. This may allow the surgeon to perform procedures that  sometimes cannot be performed with a laparoscope alone. Open surgery has a longer recovery time. AFTER THE PROCEDURE  You will be taken to the recovery area where a nurse will watch and check your progress.   You may be allowed to go home the same day.   Do not resume physical activities until directed by your  caregiver.   You may resume a normal diet and activities as directed.  Document Released: 03/12/2005 Document Revised: 03/01/2011 Document Reviewed: 08/25/2010 [facility] Patient Information 2012 [place], [place].

## 2011-07-16 ENCOUNTER — Encounter (INDEPENDENT_AMBULATORY_CARE_PROVIDER_SITE_OTHER): Payer: Self-pay

## 2011-08-14 ENCOUNTER — Encounter (HOSPITAL_BASED_OUTPATIENT_CLINIC_OR_DEPARTMENT_OTHER): Payer: Self-pay | Admitting: *Deleted

## 2011-08-14 NOTE — Pre-Procedure Instructions (Signed)
Dr. Gelene Mink aware of request for stress dose of Prednisone DOS - anesthesia will take care of.

## 2011-08-14 NOTE — Pre-Procedure Instructions (Signed)
To come for CMET, CBC, UA

## 2011-08-15 ENCOUNTER — Encounter (INDEPENDENT_AMBULATORY_CARE_PROVIDER_SITE_OTHER): Payer: Self-pay

## 2011-08-16 ENCOUNTER — Encounter (HOSPITAL_BASED_OUTPATIENT_CLINIC_OR_DEPARTMENT_OTHER)
Admission: RE | Admit: 2011-08-16 | Discharge: 2011-08-16 | Disposition: A | Discharge status: Home / Self Care | Payer: Federal, State, Local not specified - PPO | Source: Ambulatory Visit | Attending: General Surgery | Admitting: General Surgery

## 2011-08-16 LAB — COMPREHENSIVE METABOLIC PANEL
AST: 20 U/L (ref 0–37)
Albumin: 4.2 g/dL (ref 3.5–5.2)
Alkaline Phosphatase: 108 U/L (ref 39–117)
BUN: 20 mg/dL (ref 6–23)
CO2: 25 mEq/L (ref 19–32)
Chloride: 106 mEq/L (ref 96–112)
GFR calc non Af Amer: >90 mL/min (ref >90)
Potassium: 4.6 mEq/L (ref 3.5–5.1)
Total Bilirubin: 0.3 mg/dL (ref 0.3–1.2)

## 2011-08-16 LAB — CBC
HCT: 32.4 % — ABNORMAL LOW (ref 36.0–46.0)
RBC: 3.47 MIL/uL — ABNORMAL LOW (ref 3.87–5.11)
RDW: 13.2 % (ref 11.5–15.5)
WBC: 6.7 10*3/uL (ref 4.0–10.5)

## 2011-08-16 LAB — URINALYSIS, ROUTINE W REFLEX MICROSCOPIC
Glucose, UA: NEGATIVE mg/dL
Hgb urine dipstick: NEGATIVE
Ketones, ur: NEGATIVE mg/dL
Protein, ur: NEGATIVE mg/dL

## 2011-08-16 NOTE — H&P (Signed)
[name]    MRN: [id number]   Description: 56 year old female  Provider: Ernestene Mention, MD  Department: [facility]      Diagnoses     Gallstones   - Primary    574.20       Vitals    BP Pulse Temp(Src) Resp Ht Wt    112/78  80  97.6 F (36.4 C) (Temporal)  16  5' (1.524 m)  198 lb 6.4 oz (89.994 kg)       BMI - 38.75 kg/m2                 History and Physical   Ernestene Mention, MD   Patient ID: [name], female   DOB: 05-21-1955, 56 y.o.   MRN: [id number]       HPI [name] is a 56 y.o. female.  She is referred by Dr. Marinda Elk for evaluation of symptomatic gallstones.  This patient has polymyalgia rheumatica on low-dose prednisone, osteoarthritis, spinal stenosis, hypertension, DJD and obesity.  For at least one year she's been having intermittent attacks of right upper quadrant pain. This is clearly brought on by meals. Nausea is minimal. She is constipated as she takes Norco chronically. No diarrhea. No fever. She has no history of liver disease jaundice or hepatitis. She had a total hip replacement in August 2012 and recovered very nicely from that.  Gallbladder ultrasound was performed at [facility] ultrasound. This shows multiple mobile gallstones. The largest is 8 mm. There is no inflammatory change. There is a little tenderness with the ultrasound probe. The liver looked normal.  She is basically asymptomatic today. She is hopeful that she will be able to come off of prednisone at some point.    Past Medical History   Diagnosis  Date   .  Arthritis     .  Hypertension     .  Polymyalgia rheumatica     .  Polymyalgia rheumatica     .  Spinal stenosis     .  Anemia     .  Blood transfusion     .  GERD (gastroesophageal reflux disease)     .  Hyperlipidemia     .  Gallstones         Past Surgical History   Procedure  Date   .  Appendectomy     .  Tubal ligation     .  Joint replacement  10/2010       R hip replacement   .  Back  surgery  1990, 1992       Family History   Problem  Relation  Age of Onset   .  Kidney disease  Mother     .  Heart disease  Father     .  Cancer  Sister         breast, bladder, cervical, brain   .  Cancer  Brother         lung      Social History History   Substance Use Topics   .  Smoking status:  Never Smoker    .  Smokeless tobacco:  Not on file   .  Alcohol Use:  No       Allergies   Allergen  Reactions   .  Imitrex (Sumatriptan Base)         Current Outpatient Prescriptions   Medication  Sig  Dispense  Refill   .  alendronate (FOSAMAX) 70 MG tablet  Take 70 mg by mouth every 7 (seven) days. Take with a full glass of water on an empty stomach.         Marland Kitchen  atorvastatin (LIPITOR) 20 MG tablet  Take 20 mg by mouth daily.         .  cholecalciferol (VITAMIN D) 1000 UNITS tablet  Take 1,000 Units by mouth 2 (two) times daily.         .  cyclobenzaprine (FLEXERIL) 10 MG tablet  Take 10 mg by mouth 3 (three) times daily as needed.         Marland Kitchen  HYDROcodone-acetaminophen (NORCO) 7.5-325 MG per tablet  Take 1 tablet by mouth every 6 (six) hours as needed.         .  lansoprazole (PREVACID) 30 MG capsule  Take 30 mg by mouth daily.         Marland Kitchen  lisinopril-hydrochlorothiazide (PRINZIDE,ZESTORETIC) 10-12.5 MG per tablet  Take 1 tablet by mouth daily.         .  magnesium oxide (MAG-OX) 400 MG tablet  Take 400 mg by mouth daily.         .  predniSONE (DELTASONE) 10 MG tablet  Take 10 mg by mouth every Tuesday, Thursday, Saturday, and Sunday.            Review of Systems   Constitutional: Negative for fever, chills and unexpected weight change.  HENT: Negative for hearing loss, congestion, sore throat, trouble swallowing and voice change.   Eyes: Negative for visual disturbance.  Respiratory: Negative for cough and wheezing.   Cardiovascular: Negative for chest pain, palpitations and leg swelling.  Gastrointestinal: Positive for nausea, abdominal pain and constipation. Negative for  vomiting, diarrhea, blood in stool, abdominal distention and anal bleeding.  Genitourinary: Negative for hematuria, vaginal bleeding and difficulty urinating.  Musculoskeletal: Positive for myalgias and arthralgias.  Skin: Negative for rash and wound.  Neurological: Negative for seizures, syncope and headaches.  Hematological: Negative for adenopathy. Does not bruise/bleed easily.  Psychiatric/Behavioral: Negative for confusion.    Blood pressure 112/78, pulse 80, temperature 97.6 F (36.4 C), temperature source Temporal, resp. rate 16, height 5' (1.524 m), weight 198 lb 6.4 oz (89.994 kg).   Physical Exam  Constitutional: She is oriented to person, place, and time. She appears well-developed and well-nourished. No distress.  HENT:   Head: Normocephalic and atraumatic.   Nose: Nose normal.   Mouth/Throat: No oropharyngeal exudate.  Eyes: Conjunctivae and EOM are normal. Pupils are equal, round, and reactive to light. Left eye exhibits no discharge. No scleral icterus.  Neck: Neck supple. No JVD present. No tracheal deviation present. No thyromegaly present.  Cardiovascular: Normal rate, regular rhythm, normal heart sounds and intact distal pulses.    No murmur heard. Pulmonary/Chest: Effort normal and breath sounds normal. No respiratory distress. She has no wheezes. She has no rales. She exhibits no tenderness.  Abdominal: Soft. Bowel sounds are normal. She exhibits no distension and no mass. There is no tenderness. There is no rebound and no guarding. RLQ scar. Infraumbilical scar. Musculoskeletal: She exhibits no edema and no tenderness.  Lymphadenopathy:    She has no cervical adenopathy.  Neurological: She is alert and oriented to person, place, and time. She exhibits normal muscle tone. Coordination normal.  Skin: Skin is warm. No rash noted. She is not diaphoretic. No erythema. No pallor.  Psychiatric: She has a normal mood and affect. Her behavior is normal. Judgment  and  thought content normal.      Assessment  Chronic cholecystitis with cholelithiasis. She has fairly typical recurrent biliary colic. She would benefit from cholecystectomy to control her symptoms and prevent complications in the future.  Polymyalgia rheumatica on low-dose prednisone  Obesity  Degenerative joint disease  Hypertension  Hyperlipidemia  Spinal stenosis.  GERD. Controlled with Prevacid.   Plan   Scheduled for laparoscopic cholecystectomy with cholangiogram, possible open.  We will give one dose of steroids in the OR as a stress dose.  I have discussed the indications and details of cholecystectomy with her. Techniques have been outlined. Risks have been discussed, including but not limited to bleeding, infection, conversion to open laparotomy, bile leak, injury to adjacent organs such as bile duct or intestine, cardiac, pulmonary, thromboembolic problems, wound healing problems, and other unforseen  issues. She understands these issues. Her questions are answered. She agrees with this plan.       Angelia Mould. Derrell Lolling, M.D., [facility] General and Minimally invasive Surgery Breast and Colorectal Surgery Office:   [phone] Pager:   [phone]

## 2011-08-17 ENCOUNTER — Encounter (HOSPITAL_BASED_OUTPATIENT_CLINIC_OR_DEPARTMENT_OTHER): Payer: Self-pay | Admitting: Anesthesiology

## 2011-08-17 ENCOUNTER — Ambulatory Visit (HOSPITAL_COMMUNITY): Payer: Federal, State, Local not specified - PPO

## 2011-08-17 ENCOUNTER — Ambulatory Visit (HOSPITAL_BASED_OUTPATIENT_CLINIC_OR_DEPARTMENT_OTHER)
Admission: RE | Admit: 2011-08-17 | Discharge: 2011-08-17 | Disposition: A | Discharge status: Home / Self Care | Payer: Federal, State, Local not specified - PPO | Source: Ambulatory Visit | Attending: General Surgery | Admitting: General Surgery

## 2011-08-17 ENCOUNTER — Ambulatory Visit (HOSPITAL_BASED_OUTPATIENT_CLINIC_OR_DEPARTMENT_OTHER): Payer: Federal, State, Local not specified - PPO | Admitting: Anesthesiology

## 2011-08-17 ENCOUNTER — Encounter (HOSPITAL_BASED_OUTPATIENT_CLINIC_OR_DEPARTMENT_OTHER)
Admission: RE | Disposition: A | Discharge status: Home / Self Care | Payer: Self-pay | Source: Ambulatory Visit | Attending: General Surgery

## 2011-08-17 DIAGNOSIS — K219 Gastro-esophageal reflux disease without esophagitis: Secondary | ICD-10-CM | POA: Insufficient documentation

## 2011-08-17 DIAGNOSIS — I10 Essential (primary) hypertension: Secondary | ICD-10-CM | POA: Insufficient documentation

## 2011-08-17 DIAGNOSIS — IMO0002 Reserved for concepts with insufficient information to code with codable children: Secondary | ICD-10-CM | POA: Insufficient documentation

## 2011-08-17 DIAGNOSIS — K8010 Calculus of gallbladder with chronic cholecystitis without obstruction: Secondary | ICD-10-CM

## 2011-08-17 DIAGNOSIS — M353 Polymyalgia rheumatica: Secondary | ICD-10-CM | POA: Insufficient documentation

## 2011-08-17 DIAGNOSIS — R51 Headache: Secondary | ICD-10-CM | POA: Insufficient documentation

## 2011-08-17 DIAGNOSIS — M4800 Spinal stenosis, site unspecified: Secondary | ICD-10-CM | POA: Insufficient documentation

## 2011-08-17 DIAGNOSIS — Z01812 Encounter for preprocedural laboratory examination: Secondary | ICD-10-CM | POA: Insufficient documentation

## 2011-08-17 DIAGNOSIS — M1990 Unspecified osteoarthritis, unspecified site: Secondary | ICD-10-CM | POA: Insufficient documentation

## 2011-08-17 HISTORY — DX: Nausea with vomiting, unspecified: R11.2

## 2011-08-17 HISTORY — DX: Dorsalgia, unspecified: M54.9

## 2011-08-17 HISTORY — PX: CHOLECYSTECTOMY: SHX55

## 2011-08-17 HISTORY — DX: Other specified postprocedural states: Z98.890

## 2011-08-17 HISTORY — DX: Other chronic pain: G89.29

## 2011-08-17 HISTORY — DX: Snoring: R06.83

## 2011-08-17 HISTORY — DX: Headache: R51

## 2011-08-17 SURGERY — LAPAROSCOPIC CHOLECYSTECTOMY WITH INTRAOPERATIVE CHOLANGIOGRAM
Anesthesia: General | Site: Abdomen | Wound class: Clean Contaminated

## 2011-08-17 MED ORDER — OXYCODONE HCL 5 MG PO TABS: 5.0000 mg | ORAL_TABLET | ORAL | Status: DC | PRN

## 2011-08-17 MED ORDER — SODIUM CHLORIDE 0.9 % IR SOLN
Status: DC | PRN
  Administered 2011-08-17: 1

## 2011-08-17 MED ORDER — CEFAZOLIN SODIUM-DEXTROSE 2-3 GM-% IV SOLR
2.0000 g | INTRAVENOUS | Status: AC
  Administered 2011-08-17: 2 g via INTRAVENOUS

## 2011-08-17 MED ORDER — HYDROCODONE-ACETAMINOPHEN 5-325 MG PO TABS: 1.0000 | ORAL_TABLET | ORAL | Status: AC | PRN

## 2011-08-17 MED ORDER — ACETAMINOPHEN 650 MG RE SUPP: 650.0000 mg | RECTAL | Status: DC | PRN

## 2011-08-17 MED ORDER — KETOROLAC TROMETHAMINE 15 MG/ML IJ SOLN
INTRAMUSCULAR | Status: DC | PRN
  Administered 2011-08-17: 15 mg via INTRAVENOUS

## 2011-08-17 MED ORDER — PROPOFOL 10 MG/ML IV EMUL
INTRAVENOUS | Status: DC | PRN
  Administered 2011-08-17: 200 mg via INTRAVENOUS

## 2011-08-17 MED ORDER — ONDANSETRON HCL 4 MG/2ML IJ SOLN
INTRAMUSCULAR | Status: DC | PRN
  Administered 2011-08-17: 4 mg via INTRAVENOUS

## 2011-08-17 MED ORDER — BUPIVACAINE-EPINEPHRINE 0.25% -1:200000 IJ SOLN
INTRAMUSCULAR | Status: DC | PRN
  Administered 2011-08-17: 16 mL

## 2011-08-17 MED ORDER — SODIUM CHLORIDE 0.9 % IV SOLN: 250.0000 mL | INTRAVENOUS | Status: DC | PRN

## 2011-08-17 MED ORDER — ACETAMINOPHEN 325 MG PO TABS: 650.0000 mg | ORAL_TABLET | ORAL | Status: DC | PRN

## 2011-08-17 MED ORDER — POTASSIUM CHLORIDE IN NACL 20-0.9 MEQ/L-% IV SOLN: INTRAVENOUS | Status: DC

## 2011-08-17 MED ORDER — CHLORHEXIDINE GLUCONATE 4 % EX LIQD: 1.0000 "application " | Freq: Once | CUTANEOUS | Status: DC

## 2011-08-17 MED ORDER — SODIUM CHLORIDE 0.9 % IJ SOLN: 3.0000 mL | INTRAMUSCULAR | Status: DC | PRN

## 2011-08-17 MED ORDER — LACTATED RINGERS IV SOLN
INTRAVENOUS | Status: DC
  Administered 2011-08-17 (×3): via INTRAVENOUS

## 2011-08-17 MED ORDER — HYDROMORPHONE HCL PF 1 MG/ML IJ SOLN
0.2500 mg | INTRAMUSCULAR | Status: DC | PRN
  Administered 2011-08-17 (×3): 0.25 mg via INTRAVENOUS

## 2011-08-17 MED ORDER — ACETAMINOPHEN 10 MG/ML IV SOLN
1000.0000 mg | Freq: Once | INTRAVENOUS | Status: AC
  Administered 2011-08-17: 1000 mg via INTRAVENOUS

## 2011-08-17 MED ORDER — METOCLOPRAMIDE HCL 5 MG/ML IJ SOLN
INTRAMUSCULAR | Status: DC | PRN
  Administered 2011-08-17: 10 mg via INTRAVENOUS

## 2011-08-17 MED ORDER — MIDAZOLAM HCL 5 MG/5ML IJ SOLN
INTRAMUSCULAR | Status: DC | PRN
  Administered 2011-08-17: 2 mg via INTRAVENOUS

## 2011-08-17 MED ORDER — SODIUM CHLORIDE 0.9 % IV SOLN
INTRAVENOUS | Status: DC | PRN
  Administered 2011-08-17: 08:00:00

## 2011-08-17 MED ORDER — GLYCOPYRROLATE 0.2 MG/ML IJ SOLN
INTRAMUSCULAR | Status: DC | PRN
  Administered 2011-08-17: 0.6 mg via INTRAVENOUS

## 2011-08-17 MED ORDER — MORPHINE SULFATE 2 MG/ML IJ SOLN: 2.0000 mg | INTRAMUSCULAR | Status: DC | PRN

## 2011-08-17 MED ORDER — SODIUM CHLORIDE 0.9 % IJ SOLN: 3.0000 mL | Freq: Two times a day (BID) | INTRAMUSCULAR | Status: DC

## 2011-08-17 MED ORDER — CISATRACURIUM BESYLATE (PF) 10 MG/5ML IV SOLN
INTRAVENOUS | Status: DC | PRN
  Administered 2011-08-17: 12 mg via INTRAVENOUS

## 2011-08-17 MED ORDER — HEPARIN SODIUM (PORCINE) 5000 UNIT/ML IJ SOLN
5000.0000 [IU] | Freq: Once | INTRAMUSCULAR | Status: AC
  Administered 2011-08-17: 5000 [IU] via SUBCUTANEOUS

## 2011-08-17 MED ORDER — FENTANYL CITRATE 0.05 MG/ML IJ SOLN
INTRAMUSCULAR | Status: DC | PRN
  Administered 2011-08-17 (×2): 50 ug via INTRAVENOUS
  Administered 2011-08-17: 100 ug via INTRAVENOUS

## 2011-08-17 MED ORDER — METOCLOPRAMIDE HCL 5 MG/ML IJ SOLN: 10.0000 mg | Freq: Once | INTRAMUSCULAR | Status: DC | PRN

## 2011-08-17 MED ORDER — ONDANSETRON HCL 4 MG/2ML IJ SOLN: 4.0000 mg | Freq: Four times a day (QID) | INTRAMUSCULAR | Status: DC | PRN

## 2011-08-17 MED ORDER — DROPERIDOL 2.5 MG/ML IJ SOLN
INTRAMUSCULAR | Status: DC | PRN
  Administered 2011-08-17: 0.625 mg via INTRAVENOUS

## 2011-08-17 MED ORDER — NEOSTIGMINE METHYLSULFATE 1 MG/ML IJ SOLN
INTRAMUSCULAR | Status: DC | PRN
  Administered 2011-08-17: 3 mg via INTRAVENOUS

## 2011-08-17 MED ORDER — MIDAZOLAM HCL 2 MG/2ML IJ SOLN: 0.5000 mg | INTRAMUSCULAR | Status: DC | PRN

## 2011-08-17 SURGICAL SUPPLY — 39 items
ADH SKN CLS APL DERMABOND .7 (GAUZE/BANDAGES/DRESSINGS) ×1
APPLIER CLIP ROT 10 11.4 M/L (STAPLE) ×2
APR CLP MED LRG 11.4X10 (STAPLE) ×1
BAG SPEC RTRVL LRG 6X4 10 (ENDOMECHANICALS) ×1
BLADE SURG ROTATE 9660 (MISCELLANEOUS) IMPLANT
CANISTER SUCTION 2500CC (MISCELLANEOUS) ×2 IMPLANT
CHLORAPREP W/TINT 26ML (MISCELLANEOUS) ×2 IMPLANT
CLIP APPLIE ROT 10 11.4 M/L (STAPLE) ×1 IMPLANT
CLOTH BEACON ORANGE TIMEOUT ST (SAFETY) ×2 IMPLANT
COVER MAYO STAND STRL (DRAPES) ×2 IMPLANT
DECANTER SPIKE VIAL GLASS SM (MISCELLANEOUS) ×3 IMPLANT
DERMABOND ADVANCED (GAUZE/BANDAGES/DRESSINGS) ×1
DERMABOND ADVANCED .7 DNX12 (GAUZE/BANDAGES/DRESSINGS) ×1 IMPLANT
DRAPE C-ARM 42X72 X-RAY (DRAPES) ×2 IMPLANT
DRAPE UTILITY 15X26 W/TAPE STR (DRAPE) ×4 IMPLANT
ELECT REM PT RETURN 9FT ADLT (ELECTROSURGICAL) ×2
ELECTRODE REM PT RTRN 9FT ADLT (ELECTROSURGICAL) ×1 IMPLANT
GLOVE BIO SURGEON STRL SZ7 (GLOVE) ×2 IMPLANT
GLOVE BIOGEL PI IND STRL 7.0 (GLOVE) ×2 IMPLANT
GLOVE BIOGEL PI INDICATOR 7.0 (GLOVE) ×2
GLOVE EUDERMIC 7 POWDERFREE (GLOVE) ×2 IMPLANT
GLOVE SURG SIGNA 7.5 PF LTX (GLOVE) ×2 IMPLANT
GOWN PREVENTION PLUS XLARGE (GOWN DISPOSABLE) ×1 IMPLANT
GOWN STRL NON-REIN LRG LVL3 (GOWN DISPOSABLE) ×3 IMPLANT
NS IRRIG 1000ML POUR BTL (IV SOLUTION) ×2 IMPLANT
PACK BASIN DAY SURGERY FS (CUSTOM PROCEDURE TRAY) ×2 IMPLANT
POUCH SPECIMEN RETRIEVAL 10MM (ENDOMECHANICALS) ×2 IMPLANT
SCISSORS LAP 5X35 DISP (ENDOMECHANICALS) IMPLANT
SET CHOLANGIOGRAPH 5 50 .035 (SET/KITS/TRAYS/PACK) ×2 IMPLANT
SET IRRIG TUBING LAPAROSCOPIC (IRRIGATION / IRRIGATOR) ×2 IMPLANT
SLEEVE ENDOPATH XCEL 5M (ENDOMECHANICALS) ×2 IMPLANT
SLEEVE SCD COMPRESS KNEE MED (MISCELLANEOUS) ×1 IMPLANT
SUT MNCRL AB 4-0 PS2 18 (SUTURE) ×2 IMPLANT
SUT VICRYL 0 UR6 27IN ABS (SUTURE) ×2 IMPLANT
TOWEL OR 17X24 6PK STRL BLUE (TOWEL DISPOSABLE) ×2 IMPLANT
TRAY LAPAROSCOPIC (CUSTOM PROCEDURE TRAY) ×2 IMPLANT
TROCAR XCEL BLUNT TIP 100MML (ENDOMECHANICALS) ×2 IMPLANT
TROCAR XCEL NON-BLD 11X100MML (ENDOMECHANICALS) ×2 IMPLANT
TROCAR XCEL NON-BLD 5MMX100MML (ENDOMECHANICALS) ×2 IMPLANT

## 2011-08-17 NOTE — Discharge Instructions (Signed)
Drink lots of fluids, and follow a low-fat diet.  Walk a lot, including out of portion.  No sports or heavy lifting for 3 weeks.  You may shower.  Call Dr. Derrell Lolling office to make an appointment in 3 weeks.   [facility] LAPAROSCOPIC SURGERY: POST OP INSTRUCTIONS Always review your discharge instruction sheet given to you by the facility where your surgery was performed. IF YOU HAVE DISABILITY OR FAMILY LEAVE FORMS, YOU MUST BRING THEM TO THE OFFICE FOR PROCESSING.   DO NOT GIVE THEM TO YOUR DOCTOR.  1. A prescription for pain medication may be given to you upon discharge.  Take your pain medication as prescribed, if needed.  If narcotic pain medicine is not needed, then you may take acetaminophen (Tylenol) or ibuprofen (Advil) as needed. 2. Take your usually prescribed medications unless otherwise directed. 3. If you need a refill on your pain medication, please contact your pharmacy.  They will contact our office to request authorization. Prescriptions will not be filled after 5pm or on week-ends. 4. You should follow a light diet the first few days after arrival home, such as soup and crackers, etc.  Be sure to include lots of fluids daily. 5. Most patients will experience some swelling and bruising in the area of the incisions.  Ice packs will help.  Swelling and bruising can take several days to resolve.  6. It is common to experience some constipation if taking pain medication after surgery.  Increasing fluid intake and taking a stool softener (such as Colace) will usually help or prevent this problem from occurring.  A mild laxative (Milk of Magnesia or Miralax) should be taken according to package instructions if there are no bowel movements after 48 hours. 7. Unless discharge instructions indicate otherwise, you may remove your bandages 24-48 hours after surgery, and you may shower at that time.  You may have steri-strips (small skin tapes) in place directly  over the incision.  These strips should be left on the skin for 7-10 days.  If your surgeon used skin glue on the incision, you may shower in 24 hours.  The glue will flake off over the next 2-3 weeks.  Any sutures or staples will be removed at the office during your follow-up visit. 8. ACTIVITIES:  You may resume regular (light) daily activities beginning the next day--such as daily self-care, walking, climbing stairs--gradually increasing activities as tolerated.  You may have sexual intercourse when it is comfortable.  Refrain from any heavy lifting or straining until approved by your doctor. a. You may drive when you are no longer taking prescription pain medication, you can comfortably wear a seatbelt, and you can safely maneuver your car and apply brakes. b. RETURN TO WORK:  __________________________________________________________ 9. You should see your doctor in the office for a follow-up appointment approximately 2-3 weeks after your surgery.  Make sure that you call for this appointment within a day or two after you arrive home to insure a convenient appointment time. 10. OTHER INSTRUCTIONS: __________________________________________________________________________________________________________________________ __________________________________________________________________________________________________________________________ WHEN TO CALL YOUR DOCTOR: 1. Fever over 101.0 2. Inability to urinate 3. Continued bleeding from incision. 4. Increased pain, redness, or drainage from the incision. 5. Increasing abdominal pain  The clinic staff is available to answer your questions during regular business hours.  Please don't hesitate to call and ask to speak to one of the nurses for clinical concerns.  If you have a medical emergency, go to the nearest emergency room or call  911.  A surgeon from [facility] is always on call at the hospital. [address],  [place], [place]  [address] ? [address] [address], [place], [place]   [address] [phone] ? [phone] ? FAX [phone] Web site: [email or web address]

## 2011-08-17 NOTE — Anesthesia Postprocedure Evaluation (Signed)
Anesthesia Post Note  Patient: [name]  Procedure(s) Performed: Procedure(s) (LRB): LAPAROSCOPIC CHOLECYSTECTOMY WITH INTRAOPERATIVE CHOLANGIOGRAM (N/A)  Anesthesia type: General  Patient location: PACU  Post pain: Pain level controlled  Post assessment: Patient's Cardiovascular Status Stable  Last Vitals:  Filed Vitals:   08/17/11 1115  BP: 118/102  Pulse: 72  Temp:   Resp: 14    Post vital signs: Reviewed and stable  Level of consciousness: alert  Complications: No apparent anesthesia complications

## 2011-08-17 NOTE — Transfer of Care (Signed)
Immediate Anesthesia Transfer of Care Note  Patient: [name]  Procedure(s) Performed: Procedure(s) (LRB): LAPAROSCOPIC CHOLECYSTECTOMY WITH INTRAOPERATIVE CHOLANGIOGRAM (N/A)  Patient Location: PACU  Anesthesia Type: General  Level of Consciousness: sedated  Airway & Oxygen Therapy: Patient Spontanous Breathing and Patient connected to face mask oxygen  Post-op Assessment: Report given to PACU RN and Post -op Vital signs reviewed and stable  Post vital signs: Reviewed and stable  Complications: No apparent anesthesia complications

## 2011-08-17 NOTE — Op Note (Signed)
Patient Name:           [name]   Date of Surgery:        08/17/2011  Pre op Diagnosis:      Chronic cholecystitis with cholelithiasis  Post op Diagnosis:    same  Procedure:                 Laparoscopic cholecystectomy with cholangiogram  Surgeon:                     Angelia Mould. Derrell Lolling, M.D., FACS  Assistant:                      Ovidio Kin, M.D., FACS  Operative Indications:   [name] is a 56 y.o. female. She was referred by Dr. Marinda Elk for evaluation of symptomatic gallstones.       This patient has polymyalgia rheumatica on low-dose prednisone, osteoarthritis, spinal stenosis, hypertension, DJD and obesity.    For at least one year she's been having intermittent attacks of right upper quadrant pain. This is clearly brought on by meals. Nausea is minimal. She is constipated as she takes Norco chronically. No diarrhea. No fever. She has no history of liver disease jaundice or hepatitis. She had a total hip replacement in August 2012 and recovered very nicely from that.  Gallbladder ultrasound was performed at [facility] ultrasound. This shows multiple mobile gallstones. The largest is 8 mm. There is no inflammatory change. There is a little tenderness with the ultrasound probe. The liver looked normal. She was counseled as an outpatient. She is brought to the operating room electively    Operative Findings:       The gallbladder was chronically inflamed with numerous adhesions to it. The gallbladder wall was thin and the adhesions were soft and chronic. The anatomy of the cystic duct and common bile duct were conventional. The cholangiogram was normal showing normal intrahepatic and extrahepatic biliary anatomy, no filling defects, and no obstruction with good flow of contrast into the duodenum. The liver stomach duodenum small intestine large intestine otherwise looked normal. The tissues were quite soft with poor tissue integrity, consistent with her steroid use.  Procedure in  Detail:          Following the induction of general endotracheal anesthesia the patient's abdomen was prepped and draped in a sterile fashion. Intravenous antibiotics were given. Surgical time out was performed. 0.5% Marcaine with epinephrine was used as a local infiltration anesthetic. A vertical incision was made in the lower rim of the umbilicus. The fascia was incised in the midline and the abdominal cavity entered under direct vision. An 11 mm Hassan trocar was inserted and secured with the purse string suture of 0 Vicryl. Pneumoperitoneum was created and a video camera inserted. An 11 mm trocar was placed in the subxiphoid region and two 5 mm trocars placed in the right upper quadrant. There were some adhesions covering the dome of the gallbladder these were taken down with cautery uneventfully. The fundus of the gallbladder was then elevated and the rest of the adhesions were taken down identifying and dissecting out the infundibulum. We dissected out the cystic duct and the cystic artery. A large anterior branch of the cystic artery was isolated as it went up the wall the gallbladder, secured with metal clips and divided. A large window was created behind the cystic duct. The cholangiogram was inserted into the cystic duct and a cholangiogram  was obtained with the C-arm with the results of the IOC were normal as described above. I removed the cholangiogram catheter and secured the cystic duct with multiple clips and divided it. A small posterior branch of the cystic artery was isolated and secured with endoclips and divided. The gallbladder was dissected from its bed with electrocautery, placed in a specimen bag removed. The operative field was inspected and irrigated. There was no bleeding or bile leak. Irrigation fluid was clear and was removed. The pneumoperitoneum was released and the trocars were removed. The fascia at the umbilicus was closed with 0 Vicryl sutures and the skin incisions closed with  subcuticular sutures of 4-0 Monocryl and Dermabond. The patient tolerated the procedure well was taken to recovery in stable condition. EBL 10 cc complications none. Counts correct.     Angelia Mould. Derrell Lolling, M.D., FACS General and Minimally Invasive Surgery Breast and Colorectal Surgery  08/17/2011 8:54 AM

## 2011-08-17 NOTE — Anesthesia Preprocedure Evaluation (Signed)
Anesthesia Evaluation  Patient identified by MRN, date of birth, ID band Patient awake    Reviewed: Allergy & Precautions, H&P , NPO status , Patient's Chart, lab work & pertinent test results, reviewed documented beta blocker date and time   History of Anesthesia Complications (+) PONV  Airway Mallampati: II TM Distance: >3 FB Neck ROM: full    Dental   Pulmonary neg pulmonary ROS,          Cardiovascular hypertension,     Neuro/Psych  Headaches, PSYCHIATRIC DISORDERS negative psych ROS   GI/Hepatic Neg liver ROS, GERD-  Medicated and Controlled,  Endo/Other  Morbid obesity  Renal/GU negative Renal ROS  negative genitourinary   Musculoskeletal   Abdominal   Peds  Hematology negative hematology ROS (+)   Anesthesia Other Findings See surgeon's H&P   Reproductive/Obstetrics negative OB ROS                           Anesthesia Physical Anesthesia Plan  ASA: III  Anesthesia Plan: General   Post-op Pain Management:    Induction: Intravenous  Airway Management Planned: Oral ETT  Additional Equipment:   Intra-op Plan:   Post-operative Plan: Extubation in OR  Informed Consent: I have reviewed the patients History and Physical, chart, labs and discussed the procedure including the risks, benefits and alternatives for the proposed anesthesia with the patient or authorized representative who has indicated his/her understanding and acceptance.   Dental Advisory Given  Plan Discussed with: CRNA and Surgeon  Anesthesia Plan Comments:         Anesthesia Quick Evaluation

## 2011-08-17 NOTE — Interval H&P Note (Signed)
History and Physical Interval Note:  08/17/2011 7:14 AM  [name]  has presented today for surgery, with the diagnosis of gallstones  The goals of treatment and the various methods of treatment have been discussed with the patient and family. After consideration of risks, benefits and other options for treatment, the patient has consented to  Procedure(s) (LRB): LAPAROSCOPIC CHOLECYSTECTOMY WITH INTRAOPERATIVE CHOLANGIOGRAM (N/A) as a surgical intervention .  The patients' history has been reviewed and thepatient examined today, no change in status, stable for surgery.  I have reviewed the patients' chart and labs.  Questions were answered to the patient's satisfaction.     Ernestene Mention

## 2011-08-21 ENCOUNTER — Encounter (HOSPITAL_BASED_OUTPATIENT_CLINIC_OR_DEPARTMENT_OTHER): Payer: Self-pay | Admitting: General Surgery

## 2011-09-07 ENCOUNTER — Encounter (INDEPENDENT_AMBULATORY_CARE_PROVIDER_SITE_OTHER): Payer: Self-pay | Admitting: General Surgery

## 2011-09-07 ENCOUNTER — Ambulatory Visit (INDEPENDENT_AMBULATORY_CARE_PROVIDER_SITE_OTHER): Payer: Federal, State, Local not specified - PPO | Admitting: General Surgery

## 2011-09-07 VITALS — BP 140/82 | HR 80 | Temp 98.0°F | Resp 18 | Ht 60.0 in | Wt 201.6 lb

## 2011-09-07 DIAGNOSIS — K8020 Calculus of gallbladder without cholecystitis without obstruction: Secondary | ICD-10-CM

## 2011-09-07 NOTE — Patient Instructions (Signed)
You have recovered from your gallbladder surgery without any obvious complications.  Follow a low-fat diet.  You may resume all normal activities and exercise. There are no restrictions.  Return to see Dr. Derrell Lolling if any further problems arise.

## 2011-09-07 NOTE — Progress Notes (Signed)
Subjective:     Patient ID: ICYSS SKOG, female   DOB: October 12, 1955, 56 y.o.   MRN: [id number]  HPI This patient underwent laparoscopic cholecystectomy with cholangiogram on Aug 17, 2011. She has recovered uneventfully. She had one episode of diarrhea after eating cheesecake. Otherwise appetite normal. Bowel function normal. No pain. No wound problems. Pathology shows chronic cholecystitis with cholelithiasis. She had multiple stones. I gave her a copy of the pathology report.  Review of Systems     Objective:   Physical Exam Patient looks well. Good spirits. No distress.  An obese. Soft. Nontender. Wounds all well healed.    Assessment:     Chronic cholecystitis with cholelithiasis, uneventful recovery following laparoscopic cholecystectomy with cholangiogram.    Plan:     Low-fat diet advised.  Resume normal physical activities, no restriction  Return to see me p.r.n.    Angelia Mould. Derrell Lolling, M.D., [facility] General and Minimally invasive Surgery Breast and Colorectal Surgery Office:   [phone] Pager:   [phone]

## 2012-02-15 ENCOUNTER — Other Ambulatory Visit: Payer: Self-pay | Admitting: Family Medicine

## 2012-02-15 DIAGNOSIS — Z1231 Encounter for screening mammogram for malignant neoplasm of breast: Secondary | ICD-10-CM

## 2012-03-21 ENCOUNTER — Ambulatory Visit: Payer: Federal, State, Local not specified - PPO

## 2012-04-21 ENCOUNTER — Ambulatory Visit
Admission: RE | Admit: 2012-04-21 | Discharge: 2012-04-21 | Disposition: A | Discharge status: Home / Self Care | Payer: Federal, State, Local not specified - PPO | Source: Ambulatory Visit | Attending: Family Medicine | Admitting: Family Medicine

## 2012-04-21 DIAGNOSIS — Z1231 Encounter for screening mammogram for malignant neoplasm of breast: Secondary | ICD-10-CM

## 2012-05-02 ENCOUNTER — Encounter (HOSPITAL_COMMUNITY): Payer: Self-pay | Admitting: Pharmacy Technician

## 2012-05-06 ENCOUNTER — Encounter (HOSPITAL_COMMUNITY): Payer: Self-pay

## 2012-05-06 ENCOUNTER — Other Ambulatory Visit: Payer: Self-pay

## 2012-05-06 ENCOUNTER — Ambulatory Visit (HOSPITAL_COMMUNITY)
Admission: RE | Admit: 2012-05-06 | Discharge: 2012-05-06 | Disposition: A | Discharge status: Home / Self Care | Payer: Federal, State, Local not specified - PPO | Source: Ambulatory Visit | Attending: Orthopedic Surgery | Admitting: Orthopedic Surgery

## 2012-05-06 ENCOUNTER — Encounter (HOSPITAL_COMMUNITY)
Admission: RE | Admit: 2012-05-06 | Discharge: 2012-05-06 | Disposition: A | Discharge status: Home / Self Care | Payer: Federal, State, Local not specified - PPO | Source: Ambulatory Visit | Attending: Orthopedic Surgery | Admitting: Orthopedic Surgery

## 2012-05-06 DIAGNOSIS — M169 Osteoarthritis of hip, unspecified: Secondary | ICD-10-CM | POA: Insufficient documentation

## 2012-05-06 DIAGNOSIS — I10 Essential (primary) hypertension: Secondary | ICD-10-CM | POA: Insufficient documentation

## 2012-05-06 DIAGNOSIS — Z01812 Encounter for preprocedural laboratory examination: Secondary | ICD-10-CM | POA: Insufficient documentation

## 2012-05-06 DIAGNOSIS — M1610 Unilateral primary osteoarthritis, unspecified hip: Secondary | ICD-10-CM | POA: Insufficient documentation

## 2012-05-06 DIAGNOSIS — K219 Gastro-esophageal reflux disease without esophagitis: Secondary | ICD-10-CM | POA: Insufficient documentation

## 2012-05-06 DIAGNOSIS — Z01810 Encounter for preprocedural cardiovascular examination: Secondary | ICD-10-CM | POA: Insufficient documentation

## 2012-05-06 DIAGNOSIS — M4180 Other forms of scoliosis, site unspecified: Secondary | ICD-10-CM | POA: Insufficient documentation

## 2012-05-06 HISTORY — DX: Other specified disorders of bone density and structure, unspecified site: M85.80

## 2012-05-06 LAB — URINALYSIS, ROUTINE W REFLEX MICROSCOPIC
Bilirubin Urine: NEGATIVE
Hgb urine dipstick: NEGATIVE
Nitrite: NEGATIVE
Specific Gravity, Urine: 1.018 (ref 1.005–1.030)
pH: 5 (ref 5.0–8.0)

## 2012-05-06 LAB — SURGICAL PCR SCREEN
MRSA, PCR: NEGATIVE
Staphylococcus aureus: NEGATIVE

## 2012-05-06 LAB — BASIC METABOLIC PANEL
BUN: 25 mg/dL — ABNORMAL HIGH (ref 6–23)
Chloride: 100 mEq/L (ref 96–112)
Glucose, Bld: 92 mg/dL (ref 70–99)
Potassium: 4 mEq/L (ref 3.5–5.1)

## 2012-05-06 LAB — CBC
HCT: 31.3 % — ABNORMAL LOW (ref 36.0–46.0)
Hemoglobin: 10.4 g/dL — ABNORMAL LOW (ref 12.0–15.0)
MCH: 30.6 pg (ref 26.0–34.0)
MCHC: 33.2 g/dL (ref 30.0–36.0)

## 2012-05-06 LAB — PROTIME-INR: INR: 0.99 (ref 0.00–1.49)

## 2012-05-06 NOTE — Patient Instructions (Addendum)
VERSA CRATON  05/06/2012                           YOUR PROCEDURE IS SCHEDULED ON: 05/13/12               PLEASE REPORT TO SHORT STAY CENTER AT : 8:45 AM               CALL THIS NUMBER IF ANY PROBLEMS THE DAY OF SURGERY :               [phone]                      REMEMBER:   Do not eat food or drink liquids AFTER MIDNIGHT  May have clear liquids UNTIL 6 HOURS BEFORE SURGERY( 5:45 AM)  Clear liquids include soda, tea, black coffee, apple or grape juice, broth.  Take these medicines the morning of surgery with A SIP OF WATER: PREDNISONE / HYDROCODONE IF NEEDED / PEPCID   Do not wear jewelry, make-up   Do not wear lotions, powders, or perfumes.   Do not shave legs or underarms 12 hrs. before surgery (men may shave face)  Do not bring valuables to the hospital.  Contacts, dentures or bridgework may not be worn into surgery.  Leave suitcase in the car. After surgery it may be brought to your room.  For patients admitted to the hospital more than one night, checkout time is 11:00                          The day of discharge.   Patients discharged the day of surgery will not be allowed to drive home                             If going home same day of surgery, must have someone stay with you first                           24 hrs at home and arrange for some one to drive you home from hospital.    Special Instructions:   Please read over the following fact sheets that you were given:               1. MRSA  INFORMATION                      2. [place] PREPARING FOR SURGERY SHEET               3. INCENTIVE SPIROMETER                                                X_____________________________________________________________________        Failure to follow these instructions may result in cancellation of your surgery PT NOTIFIED SURGERY TIME MOVED TO 12:55PM TOMORROW  05/13/12 AND NEW ARRIVAL TIME IS 9:55 AM - NOTHING TO EAT AFTER MIDNIGHT TONIGHT BUT MAY  HAVE CLEAR LIQUIDS UNTIL 7 AM AND NOTHING TO DRINK AFTER 7 AM--ALL OTHER INSTRUCTIONS THE SAME.

## 2012-05-06 NOTE — Progress Notes (Signed)
05/06/12 1437  OBSTRUCTIVE SLEEP APNEA  Have you ever been diagnosed with sleep apnea through a sleep study? No  Do you snore loudly (loud enough to be heard through closed doors)?  1  Do you often feel tired, fatigued, or sleepy during the daytime? 0  Has anyone observed you stop breathing during your sleep? 0  Do you have, or are you being treated for high blood pressure? 1  BMI more than 35 kg/m2? 1  Age over 58 years old? 1  Neck circumference greater than 40 cm/18 inches? 0  Gender: 0  Obstructive Sleep Apnea Score 4  Score 4 or greater  Results sent to PCP

## 2012-05-12 MED ORDER — CEFAZOLIN SODIUM-DEXTROSE 2-3 GM-% IV SOLR: 2.0000 g | INTRAVENOUS | Status: DC

## 2012-05-12 NOTE — H&P (Signed)
TOTAL HIP ADMISSION H&P  Patient is admitted for left total hip arthroplasty, anterior approach.  Subjective:  Chief Complaint: left hip OA / pain  HPI: [name], 57 y.o. female, has a history of pain and functional disability in the left hip(s) due to arthritis and patient has failed non-surgical conservative treatments for greater than 12 weeks to include NSAID's and/or analgesics, corticosteriod injections and activity modification.  Onset of symptoms was gradual starting 3 years ago with gradually worsening course since that time.The patient noted no past surgery on the left hip(s).  Patient currently rates pain in the left hip at 10 out of 10 with activity. Patient has worsening of pain with activity and weight bearing, trendelenberg gait, pain that interfers with activities of daily living and pain with passive range of motion. Patient has evidence of periarticular osteophytes and joint space narrowing by imaging studies. This condition presents safety issues increasing the risk of falls. There is no current active infection. Risks, benefits and expectations were discussed with the patient. Patient understand the risks, benefits and expectations and wishes to proceed with surgery.   D/C Plans:   Home with HHPT  Post-op Meds:   Rx given for ASA, Zanaflex, Celebrex, Iron, Colace and MiraLax  Tranexamic Acid:   To be given  Decadron:    To be given  FYI:    Will need Oxycodone after surgery, been on Norco for a long time.  Would like H&H after surgery   Patient Active Problem List   Diagnosis Date Noted  . Gallstones 07/05/2011   Past Medical History  Diagnosis Date  . Arthritis   . Polymyalgia rheumatica   . Spinal stenosis   . GERD (gastroesophageal reflux disease)   . Hyperlipidemia   . Blood transfusion 10/2010  . PONV (postoperative nausea and vomiting)   . Headache     migraines  . Chronic back pain   . Hypertension     under control, has been on med. since 2001  .  Snores     denies apnea  . Osteopenia   . Anemia     HX IRON INFUSION    Past Surgical History  Procedure Laterality Date  . Appendectomy    . Tubal ligation    . Back surgery  1990, 1992    L4-5  . Joint replacement  11/14/2010    right hip  . Cholecystectomy  08/17/2011    Procedure: LAPAROSCOPIC CHOLECYSTECTOMY WITH INTRAOPERATIVE CHOLANGIOGRAM;  Surgeon: Ernestene Mention, MD;  Location: [facility];  Service: General;  Laterality: N/A;  Laparoscopic cholecystectomy with cholangiogram    No prescriptions prior to admission   Allergies  Allergen Reactions  . Imitrex (Sumatriptan Base) Other (See Comments)    CHEST PAIN    History  Substance Use Topics  . Smoking status: Never Smoker   . Smokeless tobacco: Never Used  . Alcohol Use: No    Family History  Problem Relation Age of Onset  . Kidney disease Mother   . Heart disease Father   . Cancer Sister     breast, bladder, cervical, brain  . Cancer Brother     lung  . Anesthesia problems Brother     had OP shoulder surg., died at home the next AM; had hx. of sleep apnea     Review of Systems  Constitutional: Negative.   HENT: Positive for neck pain and tinnitus.   Eyes: Negative.   Respiratory: Negative.   Cardiovascular: Negative.  Gastrointestinal: Negative.   Genitourinary: Negative.   Musculoskeletal: Positive for myalgias, back pain and joint pain.  Skin: Negative.   Neurological: Positive for headaches.  Endo/Heme/Allergies: Negative.   Psychiatric/Behavioral: The patient has insomnia.     Objective:  Physical Exam  Constitutional: She is oriented to person, place, and time. She appears well-developed and well-nourished.  HENT:  Head: Normocephalic and atraumatic.  Mouth/Throat: Oropharynx is clear and moist.  Eyes: Pupils are equal, round, and reactive to light.  Neck: Neck supple. No JVD present. No tracheal deviation present. No thyromegaly present.  Cardiovascular: Normal rate,  regular rhythm, normal heart sounds and intact distal pulses.   Respiratory: Effort normal and breath sounds normal. No stridor. No respiratory distress. She has no wheezes.  GI: Soft. There is no tenderness. There is no guarding.  Lymphadenopathy:    She has no cervical adenopathy.  Neurological: She is alert and oriented to person, place, and time.  Skin: Skin is warm and dry.  Psychiatric: She has a normal mood and affect.    Labs:  Estimated body mass index is 39.37 kg/(m^2) as calculated from the following:   Height as of 09/07/11: 5' (1.524 m).   Weight as of 09/07/11: 91.445 kg (201 lb 9.6 oz).   Imaging Review Plain radiographs demonstrate moderate degenerative joint disease of the left hip(s). The bone quality appears to be good for age and reported activity level.  Assessment/Plan:  End stage arthritis, left hip(s)  The patient history, physical examination, clinical judgement of the provider and imaging studies are consistent with end stage degenerative joint disease of the left hip(s) and total hip arthroplasty is deemed medically necessary. The treatment options including medical management, injection therapy, arthroscopy and arthroplasty were discussed at length. The risks and benefits of total hip arthroplasty were presented and reviewed. The risks due to aseptic loosening, infection, stiffness, dislocation/subluxation,  thromboembolic complications and other imponderables were discussed.  The patient acknowledged the explanation, agreed to proceed with the plan and consent was signed. Patient is being admitted for inpatient treatment for surgery, pain control, PT, OT, prophylactic antibiotics, VTE prophylaxis, progressive ambulation and ADL's and discharge planning.The patient is planning to be discharged home with home health services.    Anastasio Auerbach Constancia Geeting   PAC  05/12/2012, 4:09 PM

## 2012-05-13 ENCOUNTER — Encounter (HOSPITAL_COMMUNITY): Payer: Self-pay | Admitting: *Deleted

## 2012-05-13 ENCOUNTER — Inpatient Hospital Stay (HOSPITAL_COMMUNITY): Payer: Federal, State, Local not specified - PPO

## 2012-05-13 ENCOUNTER — Encounter (HOSPITAL_COMMUNITY)
Admission: RE | Disposition: A | Discharge status: Home Health | Payer: Self-pay | Source: Ambulatory Visit | Attending: Orthopedic Surgery

## 2012-05-13 ENCOUNTER — Inpatient Hospital Stay (HOSPITAL_COMMUNITY): Payer: Federal, State, Local not specified - PPO | Admitting: Anesthesiology

## 2012-05-13 ENCOUNTER — Inpatient Hospital Stay (HOSPITAL_COMMUNITY)
Admission: RE | Admit: 2012-05-13 | Discharge: 2012-05-15 | DRG: 818 | Disposition: A | Discharge status: Home Health | Payer: Federal, State, Local not specified - PPO | Source: Ambulatory Visit | Attending: Orthopedic Surgery | Admitting: Orthopedic Surgery

## 2012-05-13 ENCOUNTER — Encounter (HOSPITAL_COMMUNITY): Payer: Self-pay | Admitting: Anesthesiology

## 2012-05-13 DIAGNOSIS — Z6835 Body mass index (BMI) 35.0-35.9, adult: Secondary | ICD-10-CM

## 2012-05-13 DIAGNOSIS — M1610 Unilateral primary osteoarthritis, unspecified hip: Principal | ICD-10-CM | POA: Diagnosis present

## 2012-05-13 DIAGNOSIS — M899 Disorder of bone, unspecified: Secondary | ICD-10-CM | POA: Diagnosis present

## 2012-05-13 DIAGNOSIS — I10 Essential (primary) hypertension: Secondary | ICD-10-CM | POA: Diagnosis present

## 2012-05-13 DIAGNOSIS — M169 Osteoarthritis of hip, unspecified: Principal | ICD-10-CM | POA: Diagnosis present

## 2012-05-13 DIAGNOSIS — Z96649 Presence of unspecified artificial hip joint: Secondary | ICD-10-CM

## 2012-05-13 DIAGNOSIS — D62 Acute posthemorrhagic anemia: Secondary | ICD-10-CM | POA: Diagnosis not present

## 2012-05-13 DIAGNOSIS — M353 Polymyalgia rheumatica: Secondary | ICD-10-CM | POA: Diagnosis present

## 2012-05-13 DIAGNOSIS — E669 Obesity, unspecified: Secondary | ICD-10-CM | POA: Diagnosis present

## 2012-05-13 DIAGNOSIS — K219 Gastro-esophageal reflux disease without esophagitis: Secondary | ICD-10-CM | POA: Diagnosis present

## 2012-05-13 DIAGNOSIS — E785 Hyperlipidemia, unspecified: Secondary | ICD-10-CM | POA: Diagnosis present

## 2012-05-13 DIAGNOSIS — M949 Disorder of cartilage, unspecified: Secondary | ICD-10-CM | POA: Diagnosis present

## 2012-05-13 HISTORY — PX: TOTAL HIP ARTHROPLASTY: SHX124

## 2012-05-13 SURGERY — ARTHROPLASTY, HIP, TOTAL, ANTERIOR APPROACH
Anesthesia: General | Site: Hip | Laterality: Left | Wound class: Clean

## 2012-05-13 MED ORDER — TRANEXAMIC ACID 100 MG/ML IV SOLN
1000.0000 mg | Freq: Once | INTRAVENOUS | Status: AC
  Administered 2012-05-13: 1000 mg via INTRAVENOUS
  Filled 2012-05-13: qty 10

## 2012-05-13 MED ORDER — CELECOXIB 200 MG PO CAPS
200.0000 mg | ORAL_CAPSULE | Freq: Two times a day (BID) | ORAL | Status: DC
  Administered 2012-05-13 – 2012-05-15 (×4): 200 mg via ORAL
  Filled 2012-05-13 (×5): qty 1

## 2012-05-13 MED ORDER — ONDANSETRON HCL 4 MG/2ML IJ SOLN: 4.0000 mg | Freq: Four times a day (QID) | INTRAMUSCULAR | Status: DC | PRN

## 2012-05-13 MED ORDER — ONDANSETRON HCL 4 MG PO TABS: 4.0000 mg | ORAL_TABLET | Freq: Four times a day (QID) | ORAL | Status: DC | PRN

## 2012-05-13 MED ORDER — PREDNISONE 5 MG PO TABS: 5.0000 mg | ORAL_TABLET | ORAL | Status: DC

## 2012-05-13 MED ORDER — CEFAZOLIN SODIUM-DEXTROSE 2-3 GM-% IV SOLR
2.0000 g | Freq: Four times a day (QID) | INTRAVENOUS | Status: AC
  Administered 2012-05-13 – 2012-05-14 (×2): 2 g via INTRAVENOUS
  Filled 2012-05-13 (×2): qty 50

## 2012-05-13 MED ORDER — PROPOFOL 10 MG/ML IV BOLUS
INTRAVENOUS | Status: DC | PRN
  Administered 2012-05-13: 200 mg via INTRAVENOUS

## 2012-05-13 MED ORDER — RIVAROXABAN 10 MG PO TABS
10.0000 mg | ORAL_TABLET | ORAL | Status: DC
  Administered 2012-05-15: 10 mg via ORAL
  Filled 2012-05-13 (×2): qty 1

## 2012-05-13 MED ORDER — HYDROCORTISONE SOD SUCCINATE 100 MG IJ SOLR
INTRAMUSCULAR | Status: DC | PRN
  Administered 2012-05-13: 100 mg via INTRAVENOUS

## 2012-05-13 MED ORDER — ACETAMINOPHEN 10 MG/ML IV SOLN
INTRAVENOUS | Status: DC | PRN
  Administered 2012-05-13: 1000 mg via INTRAVENOUS

## 2012-05-13 MED ORDER — CHLORHEXIDINE GLUCONATE 4 % EX LIQD
60.0000 mL | Freq: Once | CUTANEOUS | Status: DC
  Filled 2012-05-13: qty 60

## 2012-05-13 MED ORDER — ALUM & MAG HYDROXIDE-SIMETH 200-200-20 MG/5ML PO SUSP: 30.0000 mL | ORAL | Status: DC | PRN

## 2012-05-13 MED ORDER — LIDOCAINE HCL (CARDIAC) 20 MG/ML IV SOLN
INTRAVENOUS | Status: DC | PRN
  Administered 2012-05-13: 50 mg via INTRAVENOUS

## 2012-05-13 MED ORDER — 0.9 % SODIUM CHLORIDE (POUR BTL) OPTIME
TOPICAL | Status: DC | PRN
  Administered 2012-05-13: 1000 mL

## 2012-05-13 MED ORDER — ZOLPIDEM TARTRATE 5 MG PO TABS
5.0000 mg | ORAL_TABLET | Freq: Every evening | ORAL | Status: DC | PRN
  Administered 2012-05-13 – 2012-05-14 (×2): 5 mg via ORAL
  Filled 2012-05-13 (×2): qty 1

## 2012-05-13 MED ORDER — MIDAZOLAM HCL 5 MG/5ML IJ SOLN
INTRAMUSCULAR | Status: DC | PRN
  Administered 2012-05-13: 2 mg via INTRAVENOUS

## 2012-05-13 MED ORDER — GLYCOPYRROLATE 0.2 MG/ML IJ SOLN
INTRAMUSCULAR | Status: DC | PRN
  Administered 2012-05-13: .5 mg via INTRAVENOUS

## 2012-05-13 MED ORDER — MAGNESIUM OXIDE 400 MG PO TABS: 400.0000 mg | ORAL_TABLET | Freq: Every day | ORAL | Status: DC

## 2012-05-13 MED ORDER — HYDROMORPHONE HCL PF 1 MG/ML IJ SOLN
INTRAMUSCULAR | Status: DC | PRN
  Administered 2012-05-13 (×4): 1 mg via INTRAVENOUS

## 2012-05-13 MED ORDER — NEOSTIGMINE METHYLSULFATE 1 MG/ML IJ SOLN
INTRAMUSCULAR | Status: DC | PRN
  Administered 2012-05-13: 3 mg via INTRAVENOUS

## 2012-05-13 MED ORDER — FERROUS SULFATE 325 (65 FE) MG PO TABS
325.0000 mg | ORAL_TABLET | Freq: Three times a day (TID) | ORAL | Status: DC
  Administered 2012-05-13 – 2012-05-15 (×3): 325 mg via ORAL
  Filled 2012-05-13 (×8): qty 1

## 2012-05-13 MED ORDER — PROMETHAZINE HCL 25 MG/ML IJ SOLN: 12.5000 mg | Freq: Four times a day (QID) | INTRAMUSCULAR | Status: DC | PRN

## 2012-05-13 MED ORDER — PHENOL 1.4 % MT LIQD: 1.0000 | OROMUCOSAL | Status: DC | PRN

## 2012-05-13 MED ORDER — METHOCARBAMOL 500 MG PO TABS
500.0000 mg | ORAL_TABLET | Freq: Four times a day (QID) | ORAL | Status: DC | PRN
  Administered 2012-05-13 – 2012-05-14 (×2): 500 mg via ORAL
  Filled 2012-05-13 (×3): qty 1

## 2012-05-13 MED ORDER — CEFAZOLIN SODIUM-DEXTROSE 2-3 GM-% IV SOLR
2.0000 g | INTRAVENOUS | Status: AC
  Administered 2012-05-13: 2 g via INTRAVENOUS

## 2012-05-13 MED ORDER — FAMOTIDINE 20 MG PO TABS
20.0000 mg | ORAL_TABLET | Freq: Two times a day (BID) | ORAL | Status: DC
  Administered 2012-05-13 – 2012-05-15 (×4): 20 mg via ORAL
  Filled 2012-05-13 (×5): qty 1

## 2012-05-13 MED ORDER — ATORVASTATIN CALCIUM 20 MG PO TABS
20.0000 mg | ORAL_TABLET | Freq: Every day | ORAL | Status: DC
  Filled 2012-05-13 (×3): qty 1

## 2012-05-13 MED ORDER — CYCLOBENZAPRINE HCL 10 MG PO TABS
10.0000 mg | ORAL_TABLET | Freq: Three times a day (TID) | ORAL | Status: DC | PRN
  Administered 2012-05-14 – 2012-05-15 (×2): 10 mg via ORAL
  Filled 2012-05-13 (×3): qty 1

## 2012-05-13 MED ORDER — MAGNESIUM OXIDE 400 (241.3 MG) MG PO TABS
400.0000 mg | ORAL_TABLET | Freq: Every day | ORAL | Status: DC
  Administered 2012-05-13 – 2012-05-14 (×2): 400 mg via ORAL
  Filled 2012-05-13 (×3): qty 1

## 2012-05-13 MED ORDER — PROMETHAZINE HCL 25 MG/ML IJ SOLN: 6.2500 mg | INTRAMUSCULAR | Status: DC | PRN

## 2012-05-13 MED ORDER — ROCURONIUM BROMIDE 100 MG/10ML IV SOLN
INTRAVENOUS | Status: DC | PRN
  Administered 2012-05-13: 50 mg via INTRAVENOUS
  Administered 2012-05-13 (×2): 20 mg via INTRAVENOUS

## 2012-05-13 MED ORDER — OXYCODONE HCL 5 MG PO TABS: 5.0000 mg | ORAL_TABLET | Freq: Once | ORAL | Status: DC | PRN

## 2012-05-13 MED ORDER — DIPHENHYDRAMINE HCL 25 MG PO CAPS: 25.0000 mg | ORAL_CAPSULE | Freq: Four times a day (QID) | ORAL | Status: DC | PRN

## 2012-05-13 MED ORDER — DOCUSATE SODIUM 100 MG PO CAPS
100.0000 mg | ORAL_CAPSULE | Freq: Two times a day (BID) | ORAL | Status: DC
  Administered 2012-05-13 – 2012-05-15 (×4): 100 mg via ORAL

## 2012-05-13 MED ORDER — METOCLOPRAMIDE HCL 10 MG PO TABS: 5.0000 mg | ORAL_TABLET | Freq: Three times a day (TID) | ORAL | Status: DC | PRN

## 2012-05-13 MED ORDER — POLYETHYLENE GLYCOL 3350 17 G PO PACK: 17.0000 g | PACK | Freq: Two times a day (BID) | ORAL | Status: DC

## 2012-05-13 MED ORDER — MEPERIDINE HCL 50 MG/ML IJ SOLN: 6.2500 mg | INTRAMUSCULAR | Status: DC | PRN

## 2012-05-13 MED ORDER — HYDROMORPHONE HCL PF 1 MG/ML IJ SOLN: 0.2500 mg | INTRAMUSCULAR | Status: DC | PRN

## 2012-05-13 MED ORDER — PROMETHAZINE HCL 12.5 MG PO TABS: 12.5000 mg | ORAL_TABLET | Freq: Four times a day (QID) | ORAL | Status: DC | PRN

## 2012-05-13 MED ORDER — ONDANSETRON HCL 4 MG/2ML IJ SOLN
INTRAMUSCULAR | Status: DC | PRN
  Administered 2012-05-13: 4 mg via INTRAVENOUS

## 2012-05-13 MED ORDER — FLEET ENEMA 7-19 GM/118ML RE ENEM: 1.0000 | ENEMA | Freq: Once | RECTAL | Status: AC | PRN

## 2012-05-13 MED ORDER — DEXTROSE 5 % IV SOLN
500.0000 mg | Freq: Four times a day (QID) | INTRAVENOUS | Status: DC | PRN
  Filled 2012-05-13: qty 5

## 2012-05-13 MED ORDER — HETASTARCH-ELECTROLYTES 6 % IV SOLN
INTRAVENOUS | Status: DC | PRN
  Administered 2012-05-13: 14:00:00 via INTRAVENOUS

## 2012-05-13 MED ORDER — FENTANYL CITRATE 0.05 MG/ML IJ SOLN
INTRAMUSCULAR | Status: DC | PRN
  Administered 2012-05-13 (×3): 50 ug via INTRAVENOUS
  Administered 2012-05-13 (×2): 100 ug via INTRAVENOUS
  Administered 2012-05-13: 50 ug via INTRAVENOUS
  Administered 2012-05-13: 100 ug via INTRAVENOUS
  Administered 2012-05-13: 50 ug via INTRAVENOUS
  Administered 2012-05-13 (×2): 100 ug via INTRAVENOUS

## 2012-05-13 MED ORDER — ACETAMINOPHEN 10 MG/ML IV SOLN: 1000.0000 mg | Freq: Once | INTRAVENOUS | Status: DC | PRN

## 2012-05-13 MED ORDER — PREDNISONE 1 MG PO TABS
7.0000 mg | ORAL_TABLET | ORAL | Status: DC
  Administered 2012-05-15: 7 mg via ORAL
  Filled 2012-05-13: qty 7

## 2012-05-13 MED ORDER — HYDROMORPHONE HCL PF 1 MG/ML IJ SOLN
0.5000 mg | INTRAMUSCULAR | Status: DC | PRN
  Administered 2012-05-13 – 2012-05-14 (×7): 1 mg via INTRAVENOUS
  Filled 2012-05-13 (×6): qty 1

## 2012-05-13 MED ORDER — SODIUM CHLORIDE 0.9 % IV SOLN
100.0000 mL/h | INTRAVENOUS | Status: DC
  Administered 2012-05-13: 100 mL/h via INTRAVENOUS
  Filled 2012-05-13 (×9): qty 1000

## 2012-05-13 MED ORDER — OXYCODONE HCL 5 MG/5ML PO SOLN
5.0000 mg | Freq: Once | ORAL | Status: DC | PRN
  Filled 2012-05-13: qty 5

## 2012-05-13 MED ORDER — BISACODYL 10 MG RE SUPP: 10.0000 mg | Freq: Every day | RECTAL | Status: DC | PRN

## 2012-05-13 MED ORDER — METOCLOPRAMIDE HCL 5 MG/ML IJ SOLN
INTRAMUSCULAR | Status: DC | PRN
  Administered 2012-05-13: 10 mg via INTRAVENOUS

## 2012-05-13 MED ORDER — SCOPOLAMINE 1 MG/3DAYS TD PT72
MEDICATED_PATCH | TRANSDERMAL | Status: DC | PRN
  Administered 2012-05-13: 1 via TRANSDERMAL

## 2012-05-13 MED ORDER — OXYCODONE HCL 5 MG PO TABS
5.0000 mg | ORAL_TABLET | ORAL | Status: DC
  Administered 2012-05-13 (×2): 10 mg via ORAL
  Administered 2012-05-13: 5 mg via ORAL
  Administered 2012-05-14 (×2): 15 mg via ORAL
  Administered 2012-05-14: 5 mg via ORAL
  Administered 2012-05-14 (×2): 10 mg via ORAL
  Administered 2012-05-14 – 2012-05-15 (×5): 15 mg via ORAL
  Filled 2012-05-13 (×2): qty 3
  Filled 2012-05-13: qty 1
  Filled 2012-05-13: qty 2
  Filled 2012-05-13: qty 3
  Filled 2012-05-13: qty 2
  Filled 2012-05-13: qty 1
  Filled 2012-05-13 (×3): qty 3
  Filled 2012-05-13: qty 2
  Filled 2012-05-13: qty 3
  Filled 2012-05-13: qty 1

## 2012-05-13 MED ORDER — METHYLPREDNISOLONE SODIUM SUCC 125 MG IJ SOLR
50.0000 mg | Freq: Once | INTRAMUSCULAR | Status: AC
  Administered 2012-05-14: 50 mg via INTRAVENOUS
  Filled 2012-05-13: qty 0.8

## 2012-05-13 MED ORDER — METOCLOPRAMIDE HCL 5 MG/ML IJ SOLN: 5.0000 mg | Freq: Three times a day (TID) | INTRAMUSCULAR | Status: DC | PRN

## 2012-05-13 MED ORDER — LACTATED RINGERS IV SOLN
INTRAVENOUS | Status: DC | PRN
  Administered 2012-05-13 (×2): via INTRAVENOUS

## 2012-05-13 MED ORDER — MENTHOL 3 MG MT LOZG: 1.0000 | LOZENGE | OROMUCOSAL | Status: DC | PRN

## 2012-05-13 SURGICAL SUPPLY — 41 items
ADH SKN CLS APL DERMABOND .7 (GAUZE/BANDAGES/DRESSINGS) ×1
BAG SPEC THK2 15X12 ZIP CLS (MISCELLANEOUS) ×2
BAG ZIPLOCK 12X15 (MISCELLANEOUS) ×4 IMPLANT
BLADE SAW SGTL 18X1.27X75 (BLADE) ×2 IMPLANT
CLOTH BEACON ORANGE TIMEOUT ST (SAFETY) ×2 IMPLANT
DERMABOND ADVANCED (GAUZE/BANDAGES/DRESSINGS) ×1
DERMABOND ADVANCED .7 DNX12 (GAUZE/BANDAGES/DRESSINGS) ×1 IMPLANT
DRAPE C-ARM 42X72 X-RAY (DRAPES) ×2 IMPLANT
DRAPE STERI IOBAN 125X83 (DRAPES) ×2 IMPLANT
DRAPE U-SHAPE 47X51 STRL (DRAPES) ×6 IMPLANT
DRSG AQUACEL AG ADV 3.5X10 (GAUZE/BANDAGES/DRESSINGS) ×2 IMPLANT
DRSG TEGADERM 4X4.75 (GAUZE/BANDAGES/DRESSINGS) ×1 IMPLANT
DURAPREP 26ML APPLICATOR (WOUND CARE) ×2 IMPLANT
ELECT BLADE TIP CTD 4 INCH (ELECTRODE) ×2 IMPLANT
ELECT REM PT RETURN 9FT ADLT (ELECTROSURGICAL) ×2
ELECTRODE REM PT RTRN 9FT ADLT (ELECTROSURGICAL) ×1 IMPLANT
EVACUATOR 1/8 PVC DRAIN (DRAIN) ×1 IMPLANT
FACESHIELD LNG OPTICON STERILE (SAFETY) ×8 IMPLANT
GAUZE SPONGE 2X2 8PLY STRL LF (GAUZE/BANDAGES/DRESSINGS) ×1 IMPLANT
GLOVE BIOGEL PI IND STRL 7.5 (GLOVE) ×1 IMPLANT
GLOVE BIOGEL PI IND STRL 8 (GLOVE) ×1 IMPLANT
GLOVE BIOGEL PI INDICATOR 7.5 (GLOVE) ×1
GLOVE BIOGEL PI INDICATOR 8 (GLOVE) ×1
GLOVE ECLIPSE 8.0 STRL XLNG CF (GLOVE) ×2 IMPLANT
GLOVE ORTHO TXT STRL SZ7.5 (GLOVE) ×4 IMPLANT
GOWN BRE IMP PREV XXLGXLNG (GOWN DISPOSABLE) ×3 IMPLANT
GOWN STRL NON-REIN LRG LVL3 (GOWN DISPOSABLE) ×2 IMPLANT
GOWN STRL REIN XL XLG (GOWN DISPOSABLE) ×1 IMPLANT
KIT BASIN OR (CUSTOM PROCEDURE TRAY) ×2 IMPLANT
PACK TOTAL JOINT (CUSTOM PROCEDURE TRAY) ×2 IMPLANT
PADDING CAST COTTON 6X4 STRL (CAST SUPPLIES) ×2 IMPLANT
SPONGE GAUZE 2X2 STER 10/PKG (GAUZE/BANDAGES/DRESSINGS) ×1
SPONGE LAP 18X18 X RAY DECT (DISPOSABLE) ×1 IMPLANT
SUCTION FRAZIER 12FR DISP (SUCTIONS) ×2 IMPLANT
SUT MNCRL AB 4-0 PS2 18 (SUTURE) ×2 IMPLANT
SUT VIC AB 1 CT1 36 (SUTURE) ×6 IMPLANT
SUT VIC AB 2-0 CT1 27 (SUTURE) ×4
SUT VIC AB 2-0 CT1 TAPERPNT 27 (SUTURE) ×2 IMPLANT
SUT VLOC 180 0 24IN GS25 (SUTURE) ×2 IMPLANT
TOWEL OR 17X26 10 PK STRL BLUE (TOWEL DISPOSABLE) ×3 IMPLANT
TRAY FOLEY CATH 14FRSI W/METER (CATHETERS) ×2 IMPLANT

## 2012-05-13 NOTE — Plan of Care (Signed)
Problem: Consults Goal: Diagnosis- Total Joint Replacement Primary Total Hip     

## 2012-05-13 NOTE — Transfer of Care (Signed)
Immediate Anesthesia Transfer of Care Note  Patient: [name]  Procedure(s) Performed: Procedure(s): TOTAL HIP ARTHROPLASTY ANTERIOR APPROACH (Left)  Patient Location: PACU  Anesthesia Type:General  Level of Consciousness: awake, oriented and patient cooperative  Airway & Oxygen Therapy: Patient Spontanous Breathing and Patient connected to face mask oxygen  Post-op Assessment: Report given to PACU RN, Post -op Vital signs reviewed and stable and Patient moving all extremities  Post vital signs: Reviewed and stable  Complications: No apparent anesthesia complications

## 2012-05-13 NOTE — Anesthesia Postprocedure Evaluation (Signed)
Anesthesia Post Note  Patient: [name]  Procedure(s) Performed: Procedure(s) (LRB): TOTAL HIP ARTHROPLASTY ANTERIOR APPROACH (Left)  Anesthesia type: General  Patient location: PACU  Post pain: Pain level controlled  Post assessment: Post-op Vital signs reviewed  Last Vitals: BP 162/97  Pulse 82  Temp(Src) 36.9 C (Oral)  Resp 16  SpO2 98%  Post vital signs: Reviewed  Level of consciousness: sedated  Complications: No apparent anesthesia complications

## 2012-05-13 NOTE — Brief Op Note (Signed)
05/13/2012  3:31 PM  PATIENT:  [name]  57 y.o. female  PRE-OPERATIVE DIAGNOSIS:  LEFT HIP OSTEOARTHRITIS  POST-OPERATIVE DIAGNOSIS:  LEFT HIP OSTEOARTHRITIS  PROCEDURE:  Procedure(s): TOTAL HIP ARTHROPLASTY ANTERIOR APPROACH (Left)  SURGEON:  Surgeon(s) and Role:    * Shelda Pal, MD - Primary  PHYSICIAN ASSISTANT: Leilani Able, PA-C  ANESTHESIA:   general  EBL:  Total I/O In: 2200 [I.V.:1000; Blood:700; IV Piggyback:500] Out: 1950 [Urine:600; Blood:1350]  BLOOD ADMINISTERED:2 units PRBC   DRAINS: (1 medium) Hemovact drain(s) in the left hip with  Suction Open   LOCAL MEDICATIONS USED:  NONE  SPECIMEN:  No Specimen  DISPOSITION OF SPECIMEN:  N/A  COUNTS:  YES  TOURNIQUET:  * No tourniquets in log *  DICTATION: .Other Dictation: Dictation Number [id number]  PLAN OF CARE: Admit to inpatient   PATIENT DISPOSITION:  PACU - hemodynamically stable.   Delay start of Pharmacological VTE agent (>24hrs) due to surgical blood loss or risk of bleeding: yes

## 2012-05-13 NOTE — Anesthesia Preprocedure Evaluation (Addendum)
Anesthesia Evaluation  Patient identified by MRN, date of birth, ID band Patient awake    Reviewed: Allergy & Precautions, H&P , NPO status , Patient's Chart, lab work & pertinent test results, reviewed documented beta blocker date and time   History of Anesthesia Complications (+) PONV  Airway Mallampati: II TM Distance: >3 FB Neck ROM: full    Dental  (+) Dental Advisory Given   Pulmonary neg pulmonary ROS,  breath sounds clear to auscultation        Cardiovascular hypertension, Pt. on medications Rhythm:Regular Rate:Normal     Neuro/Psych  Headaches, PSYCHIATRIC DISORDERS    GI/Hepatic Neg liver ROS, GERD-  Medicated and Controlled,  Endo/Other  Morbid obesity  Renal/GU negative Renal ROS  negative genitourinary   Musculoskeletal   Abdominal   Peds  Hematology negative hematology ROS (+)   Anesthesia Other Findings See surgeon's H&P   Reproductive/Obstetrics negative OB ROS                          Anesthesia Physical  Anesthesia Plan  ASA: III  Anesthesia Plan: General   Post-op Pain Management:    Induction: Intravenous  Airway Management Planned: Oral ETT  Additional Equipment:   Intra-op Plan:   Post-operative Plan: Extubation in OR  Informed Consent: I have reviewed the patients History and Physical, chart, labs and discussed the procedure including the risks, benefits and alternatives for the proposed anesthesia with the patient or authorized representative who has indicated his/her understanding and acceptance.   Dental advisory given  Plan Discussed with: CRNA  Anesthesia Plan Comments:        Anesthesia Quick Evaluation

## 2012-05-14 ENCOUNTER — Encounter (HOSPITAL_COMMUNITY): Payer: Self-pay | Admitting: Orthopedic Surgery

## 2012-05-14 DIAGNOSIS — E669 Obesity, unspecified: Secondary | ICD-10-CM | POA: Diagnosis present

## 2012-05-14 DIAGNOSIS — D62 Acute posthemorrhagic anemia: Secondary | ICD-10-CM

## 2012-05-14 LAB — TYPE AND SCREEN
ABO/RH(D): B POS
Unit division: 0

## 2012-05-14 LAB — CBC
Hemoglobin: 8.3 g/dL — ABNORMAL LOW (ref 12.0–15.0)
MCHC: 34.6 g/dL (ref 30.0–36.0)
RBC: 2.69 MIL/uL — ABNORMAL LOW (ref 3.87–5.11)
WBC: 8.8 10*3/uL (ref 4.0–10.5)

## 2012-05-14 LAB — BASIC METABOLIC PANEL
CO2: 27 mEq/L (ref 19–32)
GFR calc non Af Amer: >90 mL/min (ref >90)
Glucose, Bld: 127 mg/dL — ABNORMAL HIGH (ref 70–99)
Potassium: 4 mEq/L (ref 3.5–5.1)
Sodium: 135 mEq/L (ref 135–145)

## 2012-05-14 LAB — POCT I-STAT 4, (NA,K, GLUC, HGB,HCT)
Glucose, Bld: 187 mg/dL — ABNORMAL HIGH (ref 70–99)
Potassium: 4.2 mEq/L (ref 3.5–5.1)

## 2012-05-14 NOTE — Progress Notes (Signed)
Utilization review completed.  

## 2012-05-14 NOTE — Op Note (Signed)
NAMETENAYA, HILYER                   ACCOUNT NO.:  [id number]  MEDICAL RECORD NO.:  [id number]  LOCATION:  1601                         FACILITY:  [facility]  PHYSICIAN:  Madlyn Frankel. Charlann Boxer, M.D.  DATE OF BIRTH:  06-20-1955  DATE OF PROCEDURE:  05/13/2012 DATE OF DISCHARGE:                              OPERATIVE REPORT   PREOPERATIVE DIAGNOSIS:  Left hip osteoarthritis.  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  PROCEDURE:  Left total hip replacement.  COMPONENTS USED:  DePuy hip system, size 50 pinnacle cup, 50 mm pinnacle shell, a 32+ 4 neutral AltrX liner, a size 2 high Tri-Lock stem with a 32+ 1 delta ceramic ball.  SURGEON:  Madlyn Frankel. Charlann Boxer, MD  ASSISTANT:  Jaquelyn Bitter. Ernestene Kiel and Lanney Gins, PA-C.  ANESTHESIA:  General.  SPECIMENS:  None.  COMPLICATIONS:  None.  BLOOD LOSS:  Noted to be about 1200 mL.  She did receive 2 units of packed red blood cells in the OR based on her preoperative hemoglobin of 10 and a history of chronic anemia.  DRAINS:  One medium Hemovac.  INDICATION FOR PROCEDURE:  Ms. Dorning is a pleasant 57 year old female with a history of a right total hip replacement.  She has advanced bilateral hips.  At this point, she is ready to proceed with a left total hip replacement.  She has reviewed the risks and benefits of hip replacement in the setting that she had, as well as her medication use. Questions were encouraged, answered, reviewed extensively in the office preoperatively prior to scheduling this.  PROCEDURE IN DETAIL:  The patient was brought to operative theater. Once adequate anesthesia, preoperative antibiotics, Ancef administered, she was positioned supine on the OR table.  When I had signed up for left hip, I evaluated the incision based on the right side based on her persistent discomfort and distribution  of the superficial femoral nerve with a plan to change orientation and perhaps lateralize the incision a little bit to prevent this.  Her left hip was then positioned, pre-draped, radiating fluoroscopy was brought into the field to check orientation of pelvis on the bed prior to proceeding.  At this point, the left hip was prepped and draped in a sterile fashion from the iliac crest to the lower thigh using the shower curtain technique.  A time-out was performed identifying the patient, planned procedure, and extremity.  Her anterior-superior iliac spine was readily available and an incision was then made 2 cm lateral to this with the orientation of the incision, again heading a little bit more inferolateral as opposed to directly inferiorly.  Soft tissue planes were created down to the fascia of the tensor fascia.  At this point, there was no evidence of superficial nerve around this area.  The fascia was then incised.  The muscle swept away from it.  At this point, we encountered some of the difficulties that led to the prolonged surgical procedures as well as the blood loss and this was the amount of arthritis present that was not necessarily depicted on her radiographs.  She  had very shortened femoral neck with osteophytes.  Once the anterior aspect of the hip was exposed and I made an L capsulotomy found that the capsule was very adherent all the way around, for this reason, I made incision to excise this anterior capsule from the inferior portion all the way to the superior portion.  Yet despite this, we still had challenges removing the femoral head due to the adherence of the capsule on the posterior aspect of the femoral head and neck.  With the anterior aspect of the hip exposed, traction applied.  I made a neck osteotomy from the trochanteric fossa to the lesser trochanter, a contralateral hip cut was similar in nature, down towards the lesser trochanter.  Again once  I made the neck osteotomy and trying to remove the femoral head, was met with a significant challenge due to the adherence of the capsule on the posterior aspect of the hip.  We encounter synovial bleeding and capsular bleed in this area, which accounted for lot of the blood loss at this point.  Once the femoral head was removed.  We were able to attend to the acetabulum.  Acetabular retractors were placed.  I reamed again confirming the depth of penetration using fluoroscopy with the 48 mm reamer, then a 49 reamer.  We selected a 50 mm cup which was impacted under fluoroscopic confirmation in terms of abduction and anteversion.  Single cancellous screw was placed in the ilium followed by placement of the 32+ 4 neutral AltrX liner.  At this point, we attended to the femoral preparation.  The femur was exposed with the hip being ruled out 100 degrees and the leg holding hook placed.  I then used an osteotome and starting broach and then broached initially to a size 1 as this we had on the contralateral hip.  With this broach sitting, a little bit proud of the neck cut, I did a trial reduction. We felt at this point, that we were shortened on this left side compared to the right hip, perhaps related to acetabular orientation is supposed to be on either side, I then placed a 2 broach and retrial with a 2 broach in the high offset neck.  With this, I felt that we had to match her leg lengths appropriately, offset was appropriate as well.  We then removed the trial, to broach, placed the final 2 high Tri-Lock stem, and retrialed.  Based on the trial here, we selected a final 32+ 1 delta ceramic ball and was impacted on the clean and dry trunnion and the hip was reduced.  At this point, there was no significant hemostasis required.  I did place a medium Hemovac drain.  Given the near complete capsulotomy at this point, based on the adherence of the capsule to the femoral head and neck,  there was no capsular repair to perform.  I then reapproximated the fascia of the tensor fascia lata with a combination of #1 Vicryl and 0 V-Loc.  The remainder of the wound was closed with 2- 0 Vicryl and a running 4-0 Monocryl.  The hip was then cleaned, dried, and dressed sterilely using Dermabond Aquacel dressing.  Drain site dressed separately.  She was then brought to recovery room, extubated in stable condition, tolerated the procedure well.  Based on a preoperative hemoglobin of 10, and intraoperative assessment of hemoglobin of 7.8, she received 2 units of blood in the operating room, to hopefully prevent further blood loss.  I will hold her  anticoagulation for 24 hours and resume it.  Discharge will be based on her medical tolerance of her anemia given the fact she lives in a chronic anemic state, she will probably tolerate this well.     Madlyn Frankel Charlann Boxer, M.D.     MDO/MEDQ  D:  05/13/2012  T:  05/14/2012  Job:  [id number]

## 2012-05-14 NOTE — Progress Notes (Signed)
OT Cancellation Note  Patient Details Name: [name] MRN: [id number] DOB: 1956-03-16   Cancelled Treatment:    Reason Eval/Treat Not Completed: Other (comment) (Screen-pt presents with no OT needs. )  Reuven Braver A OTR/L [phone] 05/14/2012, 11:47 AM

## 2012-05-14 NOTE — Evaluation (Signed)
Physical Therapy Evaluation Patient Details Name: [name] MRN: [id number] DOB: 04-Apr-1955 Today's Date: 05/14/2012 Time: 9528-4132 PT Time Calculation (min): 44 min  PT Assessment / Plan / Recommendation Clinical Impression  Pt. is 57 yo female s/p L direct ant THA on 05/13/12. Pt. had difficulty initially advancing LLE. Pt gradually improved. Pt. will benefit from PT whilein acute care. Plans for Dc 2/20.    PT Assessment  Patient needs continued PT services    Follow Up Recommendations  Home health PT    Does the patient have the potential to tolerate intense rehabilitation      Barriers to Discharge        Equipment Recommendations  None recommended by PT    Recommendations for Other Services     Frequency 7X/week    Precautions / Restrictions Precautions Precautions: None Restrictions Weight Bearing Restrictions: No   Pertinent Vitals/Pain Soreness l hip      Mobility  Bed Mobility Bed Mobility: Supine to Sit Supine to Sit: 3: Mod assist;HOB elevated;With rails Details for Bed Mobility Assistance: cues on technique to facilitate getting legs to edge Transfers Transfers: Sit to Stand;Stand to Sit Sit to Stand: 4: Min assist;4: Min guard;With upper extremity assist;From bed;From toilet Stand to Sit: To toilet;With upper extremity assist;To chair/3-in-1;4: Min guard Details for Transfer Assistance: cues for LLE placement prior to sitting, although pt's stature is small and pt has no difficulty flexing hip. Ambulation/Gait Ambulation/Gait Assistance: 3: Mod assist Ambulation Distance (Feet): 20 Feet (x2) Assistive device: Rolling walker Ambulation/Gait Assistance Details: Pt. required assistance to advance LLE initially, pt reports sharp pain in L groin. Pt. improved as she amb. farther. Cues to stand more erect to facilitate advancing LLE. Gait Pattern: Step-to pattern    Exercises     PT Diagnosis: Difficulty walking;Acute pain  PT Problem List: Decreased  strength;Decreased range of motion;Decreased activity tolerance;Decreased mobility;Pain PT Treatment Interventions: DME instruction;Gait training;Stair training;Functional mobility training;Therapeutic activities;Therapeutic exercise;Patient/family education   PT Goals Acute Rehab PT Goals PT Goal Formulation: With patient Time For Goal Achievement: 05/21/12 Potential to Achieve Goals: Good Pt will go Supine/Side to Sit: with supervision PT Goal: Supine/Side to Sit - Progress: Goal set today Pt will go Sit to Supine/Side: with supervision PT Goal: Sit to Supine/Side - Progress: Goal set today Pt will go Sit to Stand: with supervision PT Goal: Sit to Stand - Progress: Goal set today Pt will go Stand to Sit: with supervision PT Goal: Stand to Sit - Progress: Goal set today Pt will Ambulate: 51 - 150 feet;with supervision;with rolling walker PT Goal: Ambulate - Progress: Goal set today Pt will Go Up / Down Stairs: 1-2 stairs;with min assist;with rolling walker PT Goal: Up/Down Stairs - Progress: Goal set today Pt will Perform Home Exercise Program: with supervision, verbal cues required/provided PT Goal: Perform Home Exercise Program - Progress: Goal set today  Visit Information  Last PT Received On: 05/14/12 Assistance Needed: +1    Subjective Data  Subjective: i just need my pain meds. Patient Stated Goal: To go home tomorrow.   Prior Functioning  Home Living Lives With: Family Available Help at Discharge: Family;Friend(s);Available 24 hours/day Type of Home: House Home Access: Stairs to enter [employer] of Steps: 2 Entrance Stairs-Rails: None Home Layout: Two level;Able to live on main level with bedroom/bathroom Bathroom Shower/Tub: Engineer, manufacturing systems: Standard Home Adaptive Equipment: Walker - rolling Prior Function Level of Independence: Independent Able to Take Stairs?: Yes Driving: Yes  Cognition  Cognition Overall Cognitive Status:  Appears within functional limits for tasks assessed/performed Arousal/Alertness: Awake/alert Orientation Level: Appears intact for tasks assessed Behavior During Session: [facility] for tasks performed    Extremity/Trunk Assessment Right Lower Extremity Assessment RLE ROM/Strength/Tone: [facility] for tasks assessed RLE Sensation: Deficits RLE Sensation Deficits: ant. thigh is numb from last surgery Left Lower Extremity Assessment LLE ROM/Strength/Tone: Deficits LLE ROM/Strength/Tone Deficits: requires assistance to move LLE to edge, had difficulty advancing LLE during gait. LLE Sensation: WFL - Light Touch Trunk Assessment Trunk Assessment: Normal   Balance    End of Session PT - End of Session Activity Tolerance: Patient limited by fatigue;Patient limited by pain Patient left: in chair;with call bell/phone within reach;with family/visitor present Nurse Communication: Mobility status  GP     Rada Hay 05/14/2012, 3:24 PM [phone]

## 2012-05-14 NOTE — Progress Notes (Signed)
Physical Therapy Treatment Patient Details Name: [name] MRN: [id number] DOB: 1955-04-21 Today's Date: 05/14/2012 Time: 4098-1191 PT Time Calculation (min): 67 min  PT Assessment / Plan / Recommendation Comments on Treatment Session  Pt. tolerating ambulation well. Improved advancement of LLE. plans DC in am.    Follow Up Recommendations  Home health PT     Does the patient have the potential to tolerate intense rehabilitation     Barriers to Discharge        Equipment Recommendations  None recommended by PT    Recommendations for Other Services    Frequency 7X/week   Plan Discharge plan remains appropriate    Precautions / Restrictions Precautions Precautions: None Restrictions Weight Bearing Restrictions: No   Pertinent Vitals/Pain No c/o except when getting into bed.    Mobility  Bed Mobility Bed Mobility: Sit to Supine Sit to Supine: 3: Mod assist Details for Bed Mobility Assistance: use of sheet to self assist getting LLE onto bed from L side of bed. Transfers Transfers: Sit to Stand;Stand to Sit Sit to Stand: 4: Min guard;From toilet;With upper extremity assist;From chair/3-in-1;With armrests Stand to Sit: To toilet;To bed;With upper extremity assist Details for Transfer Assistance: cues for LLE placement prior to sitting, although pt's stature is small and pt has no difficulty flexing hip. Ambulation/Gait Ambulation/Gait Assistance: 4: Min assist Ambulation Distance (Feet): 300 Feet Assistive device: Rolling walker Ambulation/Gait Assistance Details: pt had no difficulty advancing LLE this session. Pt. freely moving LLE during gait. Gait Pattern: Step-through pattern    Exercises     PT Diagnosis: Difficulty walking;Acute pain  PT Problem List: Decreased strength;Decreased range of motion;Decreased activity tolerance;Decreased mobility;Pain PT Treatment Interventions: DME instruction;Gait training;Stair training;Functional mobility training;Therapeutic  activities;Therapeutic exercise;Patient/family education   PT Goals Acute Rehab PT Goals PT Goal Formulation: With patient Time For Goal Achievement: 05/21/12 Potential to Achieve Goals: Good Pt will go Supine/Side to Sit: with supervision PT Goal: Supine/Side to Sit - Progress: Goal set today Pt will go Sit to Supine/Side: with supervision PT Goal: Sit to Supine/Side - Progress: Progressing toward goal Pt will go Sit to Stand: with supervision PT Goal: Sit to Stand - Progress: Progressing toward goal Pt will go Stand to Sit: with supervision PT Goal: Stand to Sit - Progress: Progressing toward goal Pt will Ambulate: 51 - 150 feet;with supervision;with rolling walker PT Goal: Ambulate - Progress: Progressing toward goal Pt will Go Up / Down Stairs: 1-2 stairs;with min assist;with rolling walker PT Goal: Up/Down Stairs - Progress: Goal set today Pt will Perform Home Exercise Program: with supervision, verbal cues required/provided PT Goal: Perform Home Exercise Program - Progress: Goal set today  Visit Information  Last PT Received On: 05/14/12 Assistance Needed: +1    Subjective Data  Subjective: I know I am going to be better. It is so much better. Patient Stated Goal: To go home tomorrow.   Cognition  Cognition Overall Cognitive Status: Appears within functional limits for tasks assessed/performed Arousal/Alertness: Awake/alert Orientation Level: Appears intact for tasks assessed Behavior During Session: [facility] for tasks performed    Balance     End of Session PT - End of Session Activity Tolerance: Patient tolerated treatment well Patient left: in bed;with call bell/phone within reach Nurse Communication: Mobility status   GP     Rada Hay 05/14/2012, 3:32 PM

## 2012-05-14 NOTE — Progress Notes (Signed)
   Subjective: 1 Day Post-Op Procedure(s) (LRB): TOTAL HIP ARTHROPLASTY ANTERIOR APPROACH (Left)   Patient reports pain as mild, pain well controlled. No events throughout the night.   Objective:   VITALS:   Filed Vitals:   05/14/12 0800  BP: 125/72  Pulse: 105  Temp: 99 F (37.2 C)   Resp: 16    Neurovascular intact Dorsiflexion/Plantar flexion intact Incision: dressing C/D/I No cellulitis present Compartment soft  LABS  Recent Labs  05/13/12 1417 05/14/12 0412  HGB 7.8* 8.3*  HCT 23.0* 24.0*  WBC  --  8.8  PLT  --  208     Recent Labs  05/13/12 1417 05/14/12 0412  NA 136 135  K 4.2 4.0  BUN  --  13  CREATININE  --  0.72  GLUCOSE 187* 127*     Assessment/Plan: 1 Day Post-Op Procedure(s) (LRB): TOTAL HIP ARTHROPLASTY ANTERIOR APPROACH (Left) HV drain d/c'ed Foley cath d/c'ed Advance diet Up with therapy D/C IV fluids Discharge home with home health eventually, when ready  Expected ABLA  Treated with iron and will observe  Obese (BMI 30-39.9) Estimated body mass index is 35.92 kg/(m^2) as calculated from the following:   Height as of this encounter: 5\' 1"  (1.549 m).   Weight as of this encounter: 86.183 kg (190 lb). Patient also counseled that weight may inhibit the healing process Patient counseled that losing weight will help with future health issues      Anastasio Auerbach. Masa Lubin   PAC  05/14/2012, 9:56 AM

## 2012-05-15 LAB — BASIC METABOLIC PANEL
Calcium: 9 mg/dL (ref 8.4–10.5)
Chloride: 108 mEq/L (ref 96–112)
Creatinine, Ser: 0.76 mg/dL (ref 0.50–1.10)
GFR calc Af Amer: >90 mL/min (ref >90)

## 2012-05-15 LAB — CBC
MCV: 90 fL (ref 78.0–100.0)
Platelets: 174 10*3/uL (ref 150–400)
RDW: 14.2 % (ref 11.5–15.5)
WBC: 11.2 10*3/uL — ABNORMAL HIGH (ref 4.0–10.5)

## 2012-05-15 MED ORDER — ASPIRIN EC 325 MG PO TBEC: 325.0000 mg | DELAYED_RELEASE_TABLET | Freq: Two times a day (BID) | ORAL | Status: DC

## 2012-05-15 MED ORDER — POLYETHYLENE GLYCOL 3350 17 G PO PACK: 17.0000 g | PACK | Freq: Two times a day (BID) | ORAL | Status: DC

## 2012-05-15 MED ORDER — DSS 100 MG PO CAPS: 100.0000 mg | ORAL_CAPSULE | Freq: Two times a day (BID) | ORAL | Status: DC

## 2012-05-15 MED ORDER — FERROUS SULFATE 325 (65 FE) MG PO TABS: 325.0000 mg | ORAL_TABLET | Freq: Three times a day (TID) | ORAL | Status: DC

## 2012-05-15 MED ORDER — OXYCODONE HCL 5 MG PO TABS: 5.0000 mg | ORAL_TABLET | ORAL | Status: DC | PRN

## 2012-05-15 NOTE — Progress Notes (Signed)
Physical Therapy Treatment Patient Details Name: [name] MRN: [id number] DOB: [birth year] Today's Date: 05/15/2012 Time: 1112-1120 PT Time Calculation (min): 8 min  PT Assessment / Plan / Recommendation Comments on Treatment Session  ready for DC/    Follow Up Recommendations  Home health PT     Does the patient have the potential to tolerate intense rehabilitation     Barriers to Discharge        Equipment Recommendations  None recommended by PT    Recommendations for Other Services    Frequency 7X/week   Plan Discharge plan remains appropriate    Precautions / Restrictions     Pertinent Vitals/Pain     Mobility  Transfers Sit to Stand: 5: Supervision;With upper extremity assist;From bed Stand to Sit: 5: Supervision Ambulation/Gait Ambulation/Gait Assistance: 5: Supervision Ambulation Distance (Feet): 20 Feet Assistive device: Rolling walker Ambulation/Gait Assistance Details: Pt. was advancing LLE with leg lifter. advised pt to not do this for safety. Instructed pt to rise up on R forefoot tio facilitate L leg advancement. Gait Pattern: Step-to pattern Stairs: Yes Stairs Assistance: 4: Min assist Stairs Assistance Details (indicate cue type and reason): daughter present Stair Management Technique: Backwards;With walker    Exercises     PT Diagnosis:    PT Problem List:   PT Treatment Interventions:     PT Goals Acute Rehab PT Goals Pt will go Sit to Stand: with supervision PT Goal: Sit to Stand - Progress: Progressing toward goal Pt will go Stand to Sit: with supervision PT Goal: Stand to Sit - Progress: Progressing toward goal Pt will Ambulate: 51 - 150 feet;with supervision;with rolling walker PT Goal: Ambulate - Progress: Progressing toward goal Pt will Go Up / Down Stairs: 1-2 stairs;with min assist;with rolling walker PT Goal: Up/Down Stairs - Progress: Met  Visit Information  Last PT Received On: 05/15/12 Assistance Needed: +1    Subjective  Data  Subjective: ready to practice steps/   Cognition  Cognition Overall Cognitive Status: Appears within functional limits for tasks assessed/performed    Balance     End of Session PT - End of Session Activity Tolerance: Patient tolerated treatment well Patient left: in chair;with family/visitor present   GP     Rada Hay 05/15/2012, 3:26 PM

## 2012-05-15 NOTE — Progress Notes (Signed)
   Subjective: 2 Days Post-Op Procedure(s) (LRB): TOTAL HIP ARTHROPLASTY ANTERIOR APPROACH (Left)   Patient reports pain as mild, pain well controlled. Feels that she has finally had a god night sleep. Appears to be more lively this morning. No events throughout the night. Ready to be discharged home.  Objective:   VITALS:   Filed Vitals:   05/15/12 0800  BP: 103/68  Pulse: 98  Temp: 98.1 F (36.7 C)   Resp: 16    Neurovascular intact Dorsiflexion/Plantar flexion intact Incision: dressing C/D/I No cellulitis present Compartment soft  LABS  Recent Labs  05/13/12 1417 05/14/12 0412 05/15/12 0426  HGB 7.8* 8.3* 7.5*  HCT 23.0* 24.0* 21.5*  WBC  --  8.8 11.2*  PLT  --  208 174     Recent Labs  05/13/12 1417 05/14/12 0412 05/15/12 0426  NA 136 135 139  K 4.2 4.0 3.8  BUN  --  13 13  CREATININE  --  0.72 0.76  GLUCOSE 187* 127* 130*     Assessment/Plan: 2 Days Post-Op Procedure(s) (LRB): TOTAL HIP ARTHROPLASTY ANTERIOR APPROACH (Left) Up with therapy Discharge home with home health Follow up in 2 weeks at [facility]. Follow up with OLIN,Itzayana Pardy D in 2 weeks.  Contact information:  [facility] [address], [address] [place] [address] [phone]    Expected ABLA  Treated with iron and will observe  Obese (BMI 30-39.9) Estimated body mass index is 35.92 kg/(m^2) as calculated from the following:   Height as of this encounter: 5\' 1"  (1.549 m).   Weight as of this encounter: 86.183 kg (190 lb). Patient also counseled that weight may inhibit the healing process Patient counseled that losing weight will help with future health issues       Anastasio Auerbach. Lynesha Bango   PAC  05/15/2012, 8:53 AM

## 2012-05-15 NOTE — Progress Notes (Signed)
Physical Therapy Treatment Patient Details Name: [name] MRN: [id number] DOB: 12-20-1955 Today's Date: 05/15/2012 Time: 7829-5621 PT Time Calculation (min): 11 min  PT Assessment / Plan / Recommendation Comments on Treatment Session  pt has more difficulty advancing LLE this am.  for DC after practices steps. pt waNTS mEDICATION FIRST.    Follow Up Recommendations  Home health PT     Does the patient have the potential to tolerate intense rehabilitation     Barriers to Discharge        Equipment Recommendations  None recommended by PT    Recommendations for Other Services    Frequency 7X/week   Plan Discharge plan remains appropriate    Precautions / Restrictions     Pertinent Vitals/Pain 7 WAITING FOR MEDS.    Mobility  Transfers Sit to Stand: 5: Supervision;With upper extremity assist;From bed Stand to Sit: 5: Supervision Ambulation/Gait Ambulation/Gait Assistance: 5: Supervision Ambulation Distance (Feet): 20 Feet Assistive device: Rolling walker Ambulation/Gait Assistance Details: Pt. was advancing LLE with leg lifter. advised pt to not do this for safety. Instructed pt to rise up on R forefoot tio facilitate L leg advancement. Gait Pattern: Step-to pattern    Exercises     PT Diagnosis:    PT Problem List:   PT Treatment Interventions:     PT Goals Acute Rehab PT Goals Pt will go Sit to Stand: with supervision PT Goal: Sit to Stand - Progress: Progressing toward goal Pt will go Stand to Sit: with supervision PT Goal: Stand to Sit - Progress: Progressing toward goal Pt will Ambulate: 51 - 150 feet;with supervision;with rolling walker PT Goal: Ambulate - Progress: Progressing toward goal  Visit Information  Last PT Received On: 05/15/12 Assistance Needed: +1    Subjective Data  Subjective: I need my medicine   Cognition  Cognition Overall Cognitive Status: Appears within functional limits for tasks assessed/performed    Balance     End of  Session PT - End of Session Activity Tolerance: Patient tolerated treatment well Patient left: in chair;with family/visitor present   GP     Rada Hay 05/15/2012, 3:24 PM

## 2012-05-15 NOTE — Care Management Note (Signed)
    Page 1 of 2   05/15/2012     11:52:55 AM   CARE MANAGEMENT NOTE 05/15/2012  Patient:  [name], [name]   Account Number:  [id number]  Date Initiated:  05/15/2012  Documentation initiated by:  Colleen Can  Subjective/Objective Assessment:   DX  LEFT HIP OSTEOArthritis; total hip replacemnt on day of admission     Action/Plan:   CM spoke with patient. Plans are for her to return to her home in [place] [place] where her daughters will be caregivers. She already has RW and other dme from previous sugery.   Anticipated DC Date:  05/15/2012   Anticipated DC Plan:  HOME/SELF CARE  In-house referral  NA      DC Planning Services  CM consult      PAC Choice  NA   Choice offered to / List presented to:  NA   DME arranged  NA      DME agency  NA     HH arranged  NA      HH agency  NA   Status of service:  Completed, signed off Medicare Important Message given?  NO (If response is "NO", the following Medicare IM given date fields will be blank) Date Medicare IM given:   Date Additional Medicare IM given:    Discharge Disposition:  HOME/SELF CARE  Per UR Regulation:    If discussed at Long Length of Stay Meetings, dates discussed:    Comments:  05/15/2012 Colleen Can BSN RN CCM [phone] Pt states she plans to do her own physical therapy with her daughter's help. States she has been through home physical therapy before and knows what she needs to do. States she has talked it over with her healthcare provider. Cm spoke with PA and he is aware and states okay with patient's plan. Pt plans to follow up with surgeon and will make changes if needed in physical therapy plan.

## 2012-05-15 NOTE — Discharge Summary (Signed)
Physician Discharge Summary  Patient ID: [name] MRN: [id number] DOB/AGE: 07/11/1955 57 y.o.  Admit date: 05/13/2012 Discharge date: 05/15/2012   Procedures:  Procedure(s) (LRB): TOTAL HIP ARTHROPLASTY ANTERIOR APPROACH (Left)  Attending Physician:  Dr. Durene Romans   Admission Diagnoses:   Left hip OA / pain  Discharge Diagnoses:  Principal Problem:   S/P left THA, AA Active Problems:   Expected blood loss anemia   Obesity (BMI 30-39.9)  Diagnosis  . Arthritis  . Polymyalgia rheumatica  . Spinal stenosis  . GERD (gastroesophageal reflux disease)  . Hyperlipidemia  . Blood transfusion  . PONV (postoperative nausea and vomiting)  . Headache - migraines  . Chronic back pain  . Hypertension  . Snores  . Osteopenia  . Anemia    HPI:    [name], 57 y.o. female, has a history of pain and functional disability in the left hip(s) due to arthritis and patient has failed non-surgical conservative treatments for greater than 12 weeks to include NSAID's and/or analgesics, corticosteriod injections and activity modification. Onset of symptoms was gradual starting 3 years ago with gradually worsening course since that time.The patient noted no past surgery on the left hip(s). Patient currently rates pain in the left hip at 10 out of 10 with activity. Patient has worsening of pain with activity and weight bearing, trendelenberg gait, pain that interfers with activities of daily living and pain with passive range of motion. Patient has evidence of periarticular osteophytes and joint space narrowing by imaging studies. This condition presents safety issues increasing the risk of falls. There is no current active infection. Risks, benefits and expectations were discussed with the patient. Patient understand the risks, benefits and expectations and wishes to proceed with surgery.   PCP: Lenora Boys, MD   Discharged Condition: good  Hospital Course:  Patient underwent the above  stated procedure on 05/13/2012. Patient tolerated the procedure well and brought to the recovery room in good condition and subsequently to the floor.  POD #1 BP: 125/72 ; Pulse: 105 ; Temp: 99 F (37.2 C) ; Resp: 16 Pt's foley was removed, as well as the hemovac drain removed. IV was changed to a saline lock. Patient reports pain as mild, pain well controlled. No events throughout the night.  Neurovascular intact, dorsiflexion/plantar flexion intact, incision: dressing C/D/I, no cellulitis present and compartment soft.   LABS  Basename  05/14/12    0412   HGB  8.3  HCT  24.0   POD #2  BP: 103/68 ; Pulse: 98 ; Temp: 98.1 F (36.7 C) ; Resp: 16 Patient reports pain as mild, pain well controlled. Feels that she has finally had a god night sleep. Appears to be more lively this morning. No events throughout the night. Ready to be discharged home. Neurovascular intact, dorsiflexion/plantar flexion intact, incision: dressing C/D/I, no cellulitis present and compartment soft.   LABS  Basename  05/15/12   0426   HGB  7.5  HCT  21.5    Discharge Exam: General appearance: alert, cooperative and no distress Extremities: Homans sign is negative, no sign of DVT, no edema, redness or tenderness in the calves or thighs and no ulcers, gangrene or trophic changes  Disposition:   Home or Self Care with follow up in 2 weeks   Follow-up Information   Follow up with Shelda Pal, MD. Schedule an appointment as soon as possible for a visit in 2 weeks.   Contact information:   [address]  [address] [place] [address] [phone]       Discharge Orders   Future Orders Complete By Expires     Call MD / Call 911  As directed     Comments:      If you experience chest pain or shortness of breath, CALL 911 and be transported to the hospital emergency room.  If you develope a fever above 101 F, pus (white drainage) or increased drainage or redness at the wound, or calf pain, call your  surgeon's office.    Change dressing  As directed     Comments:      Maintain surgical dressing for 10-14 days, then replace with 4x4 guaze and tape. Keep the area dry and clean.    Constipation Prevention  As directed     Comments:      Drink plenty of fluids.  Prune juice may be helpful.  You may use a stool softener, such as Colace (over the counter) 100 mg twice a day.  Use MiraLax (over the counter) for constipation as needed.    Diet - low sodium heart healthy  As directed     Discharge instructions  As directed     Comments:      Maintain surgical dressing for 10-14 days, then replace with gauze and tape. Keep the area dry and clean until follow up. Follow up in 2 weeks at [facility]. Call with any questions or concerns.    Increase activity slowly as tolerated  As directed     TED hose  As directed     Comments:      Use stockings (TED hose) for 2 weeks on both leg(s).  You may remove them at night for sleeping.    Weight bearing as tolerated  As directed          Medication List    STOP taking these medications       HYDROcodone-acetaminophen 7.5-325 MG per tablet  Commonly known as:  NORCO     ibuprofen 200 MG tablet  Commonly known as:  ADVIL,MOTRIN      TAKE these medications       alendronate 70 MG tablet  Commonly known as:  FOSAMAX  Take 70 mg by mouth every 7 (seven) days. Take with a full glass of water on an empty stomach. - On Monday     aspirin EC 325 MG tablet  Take 1 tablet (325 mg total) by mouth 2 (two) times daily.     atorvastatin 20 MG tablet  Commonly known as:  LIPITOR  Take 20 mg by mouth at bedtime.     celecoxib 200 MG capsule  Commonly known as:  CELEBREX  Take 200 mg by mouth 2 (two) times daily.     cyclobenzaprine 10 MG tablet  Commonly known as:  FLEXERIL  Take 10 mg by mouth 3 (three) times daily as needed. For pain     diphenhydrAMINE 25 mg capsule  Commonly known as:  BENADRYL  Take 50 mg by mouth at bedtime as  needed. For sleep     DSS 100 MG Caps  Take 100 mg by mouth 2 (two) times daily.     famotidine 20 MG tablet  Commonly known as:  PEPCID  Take 20 mg by mouth 2 (two) times daily.     ferrous sulfate 325 (65 FE) MG tablet  Take 1 tablet (325 mg total) by mouth 3 (three) times daily after meals.  lisinopril-hydrochlorothiazide 10-12.5 MG per tablet  Commonly known as:  PRINZIDE,ZESTORETIC  Take 0.5 tablets by mouth every morning.     magnesium oxide 400 MG tablet  Commonly known as:  MAG-OX  Take 400 mg by mouth at bedtime.     oxyCODONE 5 MG immediate release tablet  Commonly known as:  Oxy IR/ROXICODONE  Take 1-3 tablets (5-15 mg total) by mouth every 4 (four) hours as needed for pain.     polyethylene glycol packet  Commonly known as:  MIRALAX / GLYCOLAX  Take 17 g by mouth 2 (two) times daily.     predniSONE 5 MG tablet  Commonly known as:  DELTASONE  Take 5 mg by mouth every other day. Take with the 1 mg tablets on even days to make 7 mg.     predniSONE 1 MG tablet  Commonly known as:  DELTASONE  Take 2 mg by mouth every other day. Take with the 5 mg tablet on even days to make 7 mg.     zolpidem 5 MG tablet  Commonly known as:  AMBIEN  Take 5 mg by mouth at bedtime as needed. For sleep         Signed: Anastasio Auerbach. Mena Simonis   PAC  05/15/2012, 6:02 PM

## 2014-10-12 ENCOUNTER — Other Ambulatory Visit: Payer: Self-pay | Admitting: Gastroenterology

## 2014-10-13 ENCOUNTER — Other Ambulatory Visit: Payer: Self-pay | Admitting: Family Medicine

## 2014-10-13 ENCOUNTER — Other Ambulatory Visit: Payer: Self-pay

## 2014-10-13 DIAGNOSIS — Z1231 Encounter for screening mammogram for malignant neoplasm of breast: Secondary | ICD-10-CM

## 2014-10-13 DIAGNOSIS — Z803 Family history of malignant neoplasm of breast: Secondary | ICD-10-CM

## 2014-10-13 DIAGNOSIS — M8580 Other specified disorders of bone density and structure, unspecified site: Secondary | ICD-10-CM

## 2014-11-04 ENCOUNTER — Ambulatory Visit
Admission: RE | Admit: 2014-11-04 | Discharge: 2014-11-04 | Disposition: A | Discharge status: Home / Self Care | Payer: Federal, State, Local not specified - PPO | Source: Ambulatory Visit | Attending: Family Medicine | Admitting: Family Medicine

## 2014-11-04 ENCOUNTER — Ambulatory Visit
Admission: RE | Admit: 2014-11-04 | Discharge: 2014-11-04 | Disposition: A | Discharge status: Home / Self Care | Payer: Federal, State, Local not specified - PPO | Source: Ambulatory Visit

## 2014-11-04 DIAGNOSIS — Z803 Family history of malignant neoplasm of breast: Secondary | ICD-10-CM

## 2014-11-04 DIAGNOSIS — Z1231 Encounter for screening mammogram for malignant neoplasm of breast: Secondary | ICD-10-CM

## 2014-11-04 DIAGNOSIS — M8580 Other specified disorders of bone density and structure, unspecified site: Secondary | ICD-10-CM

## 2014-11-23 ENCOUNTER — Other Ambulatory Visit: Payer: Self-pay | Admitting: Family Medicine

## 2020-04-14 ENCOUNTER — Telehealth: Payer: Self-pay | Admitting: *Deleted

## 2020-04-14 NOTE — Telephone Encounter (Signed)
[name] brings her daughter (who is Dr. Charlott Rakes pt) to the office.  She is calling to request an appointment to discuss workup at [facility] for pulm fibrosis.  She has been dx w bronchiectasis, interstitial lung disease and has mediastinal lymph node enlargement.. Today she was scheduled for a bronchoscopy and this was cancelled intil she has a stress test because of an abn ekg, and ongoing chest pain and sob ( x several months)  They gave her an order for a stress test but she told them she wanted to wait and see the cardiologist.  She also has ongoing high blood pressure issues that her primary care doctor has been working to treat.  I have scheduled her with Dr. Tenny Craw on 04/18/20.

## 2020-04-18 ENCOUNTER — Encounter: Payer: Self-pay | Admitting: Internal Medicine

## 2020-04-18 ENCOUNTER — Ambulatory Visit (INDEPENDENT_AMBULATORY_CARE_PROVIDER_SITE_OTHER): Payer: Medicare Other | Admitting: Internal Medicine

## 2020-04-18 ENCOUNTER — Other Ambulatory Visit: Payer: Self-pay

## 2020-04-18 ENCOUNTER — Encounter: Payer: Self-pay | Admitting: *Deleted

## 2020-04-18 VITALS — BP 112/70 | HR 97 | Ht 61.0 in | Wt 155.8 lb

## 2020-04-18 DIAGNOSIS — Z01812 Encounter for preprocedural laboratory examination: Secondary | ICD-10-CM

## 2020-04-18 DIAGNOSIS — R0602 Shortness of breath: Secondary | ICD-10-CM

## 2020-04-18 DIAGNOSIS — R0789 Other chest pain: Secondary | ICD-10-CM | POA: Diagnosis not present

## 2020-04-18 DIAGNOSIS — E782 Mixed hyperlipidemia: Secondary | ICD-10-CM | POA: Diagnosis not present

## 2020-04-18 MED ORDER — ASPIRIN EC 81 MG PO TBEC: 81.0000 mg | DELAYED_RELEASE_TABLET | Freq: Every day | ORAL | 3 refills | Status: AC

## 2020-04-18 MED ORDER — TRIAMTERENE-HCTZ 37.5-25 MG PO TABS: 0.5000 | ORAL_TABLET | Freq: Every day | ORAL | 1 refills | Status: DC

## 2020-04-18 NOTE — Patient Instructions (Addendum)
Medication Instructions:  Your physician has recommended you make the following change in your medication:  1.) stop lisinopril-hctz 2.) start triamterine/hctz 37.5-25  -take HALF TABLET DAILY 3.) start aspirin 81 mg (enteric coated)  *If you need a refill on your cardiac medications before your next appointment, please call your pharmacy*   Lab Work: Today: BMET, CBC, LIPIDS  If you have labs (blood work) drawn today and your tests are completely normal, you will receive your results only by: Marland Kitchen MyChart Message (if you have MyChart) OR . A paper copy in the mail If you have any lab test that is abnormal or we need to change your treatment, we will call you to review the results.   Testing/Procedures: Your physician has requested that you have an echocardiogram. Echocardiography is a painless test that uses sound waves to create images of your heart. It provides your doctor with information about the size and shape of your heart and how well your heart's chambers and valves are working. This procedure takes approximately one hour. There are no restrictions for this procedure.  Your physician has requested that you have a cardiac catheterization. Cardiac catheterization is used to diagnose and/or treat various heart conditions. Doctors may recommend this procedure for a number of different reasons. The most common reason is to evaluate chest pain. Chest pain can be a symptom of coronary artery disease (CAD), and cardiac catheterization can show whether plaque is narrowing or blocking your heart's arteries. This procedure is also used to evaluate the valves, as well as measure the blood flow and oxygen levels in different parts of your heart. For further information please visit [email or web address] Please follow instruction sheet, as given.    Follow-Up: Follow up with your physician will depend on test results.  Other Instructions

## 2020-04-18 NOTE — Progress Notes (Signed)
Cardiology Office Note   Date:  04/18/2020   ID:  [name], [name], MRN [id number]  PCP:  Deloris Ping, MD  Cardiologist:   Dietrich Pates, MD   Pt is self referred for evaluation of chest pressure     History of Present Illness: [name] is a 65 y.o. female with a history of PMR,  HTN, and OA   I have seen her in the past as she accomp her daughter to clinic (has MVP)  The pt says in Jan 2020 she  became sick  Coughing   Went to PCP   CXR normal  That winter lost 60 lb  Thinks that is when  lung probllems started  June 2020 she developed sharp R sided CP  (RUQ to back)  Went to ER at [facility] done   CT done CT scan showed scarring and inflammation in lungs   She says her O2 sats were  88%   ? Hypersensitivity pneumonitis.   R/O for PE Seen initially by pulmonary service at [facility].   Spirometry showed sl reduced FEV1 and FVC, mild decreased TLS and DLCO   Steroids recomm but pt refused. 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 Pneumonitis  Note CT showed moderate coronary calcificaitons   Bronchoscopy planned  EKG done in preop   Abnormal     The pt was seen in IM on 04/04/20  PFTs recomm   Work up for ILD and pneuonitis.   BP was found to be severely elevated at 182/98   Placed on lisinopril/ HCTZ   She had been on lisinopril in the past   Complicated by cough   The pt says over the past couple of years the SOB has gotten progressively worse, SOB with activity   She also notes chest pressure with activity.  Squeezing sensation.   Fullness Some dizziness        Current Meds  Medication Sig  . albuterol (VENTOLIN HFA) 108 (90 Base) MCG/ACT inhaler albuterol sulfate HFA 90 mcg/actuation aerosol inhaler  INHALE 2 PUFFS INTO THE LUNGS EVERY 4 HOURS AS NEEDED FOR WHEEZING  . amLODipine (NORVASC) 5 MG tablet Take 5 mg by mouth daily.  Marland Kitchen amoxicillin (AMOXIL) 500 MG capsule Take 500 mg by mouth as directed.  Before dental aapointments  . Cholecalciferol 25 MCG (1000 UT) tablet Take by mouth.  . cyclobenzaprine (FLEXERIL) 10 MG tablet Take 10 mg by mouth 3 (three) times daily as needed. For pain  . diphenhydrAMINE (BENADRYL) 25 mg capsule Take 50 mg by mouth at bedtime as needed. For sleep  . gabapentin (NEURONTIN) 100 MG capsule Take 200 mg by mouth as needed. Restless legs  . lisinopril-hydrochlorothiazide (PRINZIDE,ZESTORETIC) 10-12.5 MG per tablet Take 0.5 tablets by mouth every morning.  . magnesium oxide (MAG-OX) 400 MG tablet Take 400 mg by mouth at bedtime.  Marland Kitchen oxyCODONE (OXY IR/ROXICODONE) 5 MG immediate release tablet Take 1-3 tablets (5-15 mg total) by mouth every 4 (four) hours as needed for pain.  . promethazine (PHENERGAN) 25 MG tablet promethazine 25 mg tablet  TAKE 1 TABLET BY MOUTH FOUR TIMES DAILY AS NEEDED FOR NAUSEA WITH MIGRAINE     Allergies:   Imitrex [sumatriptan base]   Past Medical History:  Diagnosis Date  . Anemia    HX IRON INFUSION  . Arthritis   . Blood transfusion 10/2010  .  Chronic back pain   . GERD (gastroesophageal reflux disease)   . Headache(784.0)    migraines  . Hyperlipidemia   . Hypertension    under control, has been on med. since 2001  . Osteopenia   . Polymyalgia rheumatica (HCC)   . PONV (postoperative nausea and vomiting)   . Snores    denies apnea  . Spinal stenosis     Past Surgical History:  Procedure Laterality Date  . APPENDECTOMY    . BACK SURGERY  1990, 1992   L4-5  . CHOLECYSTECTOMY  08/17/2011   Procedure: LAPAROSCOPIC CHOLECYSTECTOMY WITH INTRAOPERATIVE CHOLANGIOGRAM;  Surgeon: Ernestene Mention, MD;  Location: [facility];  Service: General;  Laterality: N/A;  Laparoscopic cholecystectomy with cholangiogram  . JOINT REPLACEMENT  11/14/2010   right hip  . TOTAL HIP ARTHROPLASTY Left 05/13/2012   Procedure: TOTAL HIP ARTHROPLASTY ANTERIOR APPROACH;  Surgeon: Shelda Pal, MD;  Location: [facility];  Service:  Orthopedics;  Laterality: Left;  . TUBAL LIGATION       Social History:  The patient  reports that she has never smoked. She has never used smokeless tobacco. She reports that she does not drink alcohol and does not use drugs.   Family History:  The patient's family history includes Anesthesia problems in her brother; Cancer in her brother and sister; Heart disease in her father; Kidney disease in her mother.    ROS:  Please see the history of present illness. All other systems are reviewed and  Negative to the above problem except as noted.    PHYSICAL EXAM: VS:  BP 112/70   Pulse 97   Ht 5\' 1"  (1.549 m)   Wt 155 lb 12.8 oz (70.7 kg)   SpO2 97%   BMI 29.44 kg/m   GEN: Overweight 65 yo in no acute distress  HEENT: normal  Neck: no JVD, carotid bruits Cardiac: RRR; no murmurs  No LE  edema  Respiratory:  clear to auscultation bilaterally,  GI: soft, nontender, nondistended, + BS  No hepatomegaly  MS: no deformity Moving all extremities   Skin: warm and dry, no rash Neuro:  Strength and sensation are intact Psych: euthymic mood, full affect   EKG:  EKG is not  ordered today.  At [facility] on 04/14/20   SR   93 bpm   Sl ST depression with T wave inversion V4 to V6, I, II.   New from 2014   Lipid Panel No results found for: CHOL, TRIG, HDL, CHOLHDL, VLDL, LDLCALC, LDLDIRECT    Wt Readings from Last 3 Encounters:  04/18/20 155 lb 12.8 oz (70.7 kg)  05/13/12 190 lb (86.2 kg)  05/06/12 190 lb 2 oz (86.2 kg)      ASSESSMENT AND PLAN:  1  Chest pressure / SOB  Pt has progressive SOB and now chest pressure with activity   Concerning    Her CT is abnormal but her PFTs show only mild limitation (though they were a couple years old )  I walked with pt in back hall today and sats remained above 90%    Symptoms overall seem out of proportion to pulmonary findings The CT report from latest CT of chest shows mod calcifcation of coronary arteries    Concerning that symptoms may be angina.    EKG also with changes   I have discussed above with pt   WOuld recomm a R and L heart cath to define pressures and coronary anatomy   Risks/  benefits explained  Pt understands and agrees to proceed.   Would recomm ecASA daily     2  Lipids   Pt should be on statin   Was on in past   Not sure why stopped   Will check lipids     3  HTN  BP is OK now   BUt with cough and lisinopril I would stop lisinopril/HCTZ and start maxzide (37.5/25   Take 1/2 tab per day    Hold day of procedure.   Continue amlodipine   Current medicines are reviewed at length with the patient today.  The patient does not have concerns regarding medicines.  Signed, Tatiyana Foucher, MD  04/18/2020 3:34 PM     [facility] [address], [place], [place]  [address] Phone: [phone]; Fax: [phone]    

## 2020-04-18 NOTE — H&P (View-Only) (Signed)
Cardiology Office Note   Date:  04/18/2020   ID:  [name], [name] July 23, 1955, MRN [id number]  PCP:  Deloris Ping, MD  Cardiologist:   Dietrich Pates, MD   Pt is self referred for evaluation of chest pressure     History of Present Illness: [name] is a 65 y.o. female with a history of PMR,  HTN, and OA   I have seen her in the past as she accomp her daughter to clinic (has MVP)  The pt says in Jan 2020 she  became sick  Coughing   Went to PCP   CXR normal  That winter lost 60 lb  Thinks that is when  lung probllems started  June 2020 she developed sharp R sided CP  (RUQ to back)  Went to ER at [facility] done   CT done CT scan showed scarring and inflammation in lungs   She says her O2 sats were  88%   ? Hypersensitivity pneumonitis.   R/O for PE Seen initially by pulmonary service at [facility].   Spirometry showed sl reduced FEV1 and FVC, mild decreased TLS and DLCO   Steroids recomm but pt refused. 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 Pneumonitis  Note CT showed moderate coronary calcificaitons   Bronchoscopy planned  EKG done in preop   Abnormal     The pt was seen in IM on 04/04/20  PFTs recomm   Work up for ILD and pneuonitis.   BP was found to be severely elevated at 182/98   Placed on lisinopril/ HCTZ   She had been on lisinopril in the past   Complicated by cough   The pt says over the past couple of years the SOB has gotten progressively worse, SOB with activity   She also notes chest pressure with activity.  Squeezing sensation.   Fullness Some dizziness        Current Meds  Medication Sig  . albuterol (VENTOLIN HFA) 108 (90 Base) MCG/ACT inhaler albuterol sulfate HFA 90 mcg/actuation aerosol inhaler  INHALE 2 PUFFS INTO THE LUNGS EVERY 4 HOURS AS NEEDED FOR WHEEZING  . amLODipine (NORVASC) 5 MG tablet Take 5 mg by mouth daily.  Marland Kitchen amoxicillin (AMOXIL) 500 MG capsule Take 500 mg by mouth as directed.  Before dental aapointments  . Cholecalciferol 25 MCG (1000 UT) tablet Take by mouth.  . cyclobenzaprine (FLEXERIL) 10 MG tablet Take 10 mg by mouth 3 (three) times daily as needed. For pain  . diphenhydrAMINE (BENADRYL) 25 mg capsule Take 50 mg by mouth at bedtime as needed. For sleep  . gabapentin (NEURONTIN) 100 MG capsule Take 200 mg by mouth as needed. Restless legs  . lisinopril-hydrochlorothiazide (PRINZIDE,ZESTORETIC) 10-12.5 MG per tablet Take 0.5 tablets by mouth every morning.  . magnesium oxide (MAG-OX) 400 MG tablet Take 400 mg by mouth at bedtime.  Marland Kitchen oxyCODONE (OXY IR/ROXICODONE) 5 MG immediate release tablet Take 1-3 tablets (5-15 mg total) by mouth every 4 (four) hours as needed for pain.  . promethazine (PHENERGAN) 25 MG tablet promethazine 25 mg tablet  TAKE 1 TABLET BY MOUTH FOUR TIMES DAILY AS NEEDED FOR NAUSEA WITH MIGRAINE     Allergies:   Imitrex [sumatriptan base]   Past Medical History:  Diagnosis Date  . Anemia    HX IRON INFUSION  . Arthritis   . Blood transfusion 10/2010  .  Chronic back pain   . GERD (gastroesophageal reflux disease)   . Headache(784.0)    migraines  . Hyperlipidemia   . Hypertension    under control, has been on med. since 2001  . Osteopenia   . Polymyalgia rheumatica (HCC)   . PONV (postoperative nausea and vomiting)   . Snores    denies apnea  . Spinal stenosis     Past Surgical History:  Procedure Laterality Date  . APPENDECTOMY    . BACK SURGERY  1990, 1992   L4-5  . CHOLECYSTECTOMY  08/17/2011   Procedure: LAPAROSCOPIC CHOLECYSTECTOMY WITH INTRAOPERATIVE CHOLANGIOGRAM;  Surgeon: Ernestene Mention, MD;  Location: [facility];  Service: General;  Laterality: N/A;  Laparoscopic cholecystectomy with cholangiogram  . JOINT REPLACEMENT  11/14/2010   right hip  . TOTAL HIP ARTHROPLASTY Left 05/13/2012   Procedure: TOTAL HIP ARTHROPLASTY ANTERIOR APPROACH;  Surgeon: Shelda Pal, MD;  Location: [facility];  Service:  Orthopedics;  Laterality: Left;  . TUBAL LIGATION       Social History:  The patient  reports that she has never smoked. She has never used smokeless tobacco. She reports that she does not drink alcohol and does not use drugs.   Family History:  The patient's family history includes Anesthesia problems in her brother; Cancer in her brother and sister; Heart disease in her father; Kidney disease in her mother.    ROS:  Please see the history of present illness. All other systems are reviewed and  Negative to the above problem except as noted.    PHYSICAL EXAM: VS:  BP 112/70   Pulse 97   Ht 5\' 1"  (1.549 m)   Wt 155 lb 12.8 oz (70.7 kg)   SpO2 97%   BMI 29.44 kg/m   GEN: Overweight 65 yo in no acute distress  HEENT: normal  Neck: no JVD, carotid bruits Cardiac: RRR; no murmurs  No LE  edema  Respiratory:  clear to auscultation bilaterally,  GI: soft, nontender, nondistended, + BS  No hepatomegaly  MS: no deformity Moving all extremities   Skin: warm and dry, no rash Neuro:  Strength and sensation are intact Psych: euthymic mood, full affect   EKG:  EKG is not  ordered today.  At [facility] on 04/14/20   SR   93 bpm   Sl ST depression with T wave inversion V4 to V6, I, II.   New from 2014   Lipid Panel No results found for: CHOL, TRIG, HDL, CHOLHDL, VLDL, LDLCALC, LDLDIRECT    Wt Readings from Last 3 Encounters:  04/18/20 155 lb 12.8 oz (70.7 kg)  05/13/12 190 lb (86.2 kg)  05/06/12 190 lb 2 oz (86.2 kg)      ASSESSMENT AND PLAN:  1  Chest pressure / SOB  Pt has progressive SOB and now chest pressure with activity   Concerning    Her CT is abnormal but her PFTs show only mild limitation (though they were a couple years old )  I walked with pt in back hall today and sats remained above 90%    Symptoms overall seem out of proportion to pulmonary findings The CT report from latest CT of chest shows mod calcifcation of coronary arteries    Concerning that symptoms may be angina.    EKG also with changes   I have discussed above with pt   WOuld recomm a R and L heart cath to define pressures and coronary anatomy   Risks/  benefits explained  Pt understands and agrees to proceed.   Would recomm ecASA daily     2  Lipids   Pt should be on statin   Was on in past   Not sure why stopped   Will check lipids     3  HTN  BP is OK now   BUt with cough and lisinopril I would stop lisinopril/HCTZ and start maxzide (37.5/25   Take 1/2 tab per day    Hold day of procedure.   Continue amlodipine   Current medicines are reviewed at length with the patient today.  The patient does not have concerns regarding medicines.  Signed, Dietrich Pates, MD  04/18/2020 3:34 PM    [facility] [address] [place], [place], [place]  [address] Phone: [phone]; Fax: [phone]

## 2020-04-19 LAB — CBC
Hematocrit: 41.3 % (ref 34.0–46.6)
Hemoglobin: 13.8 g/dL (ref 11.1–15.9)
MCH: 29.4 pg (ref 26.6–33.0)
MCHC: 33.4 g/dL (ref 31.5–35.7)
MCV: 88 fL (ref 79–97)
Platelets: 368 10*3/uL (ref 150–450)
RBC: 4.69 x10E6/uL (ref 3.77–5.28)
RDW: 12.8 % (ref 11.7–15.4)
WBC: 10.8 10*3/uL (ref 3.4–10.8)

## 2020-04-19 LAB — BASIC METABOLIC PANEL
BUN/Creatinine Ratio: 19 (ref 12–28)
BUN: 19 mg/dL (ref 8–27)
CO2: 22 mmol/L (ref 20–29)
Calcium: 11.7 mg/dL — ABNORMAL HIGH (ref 8.7–10.3)
Chloride: 101 mmol/L (ref 96–106)
Creatinine, Ser: 0.98 mg/dL (ref 0.57–1.00)
GFR calc Af Amer: 70 mL/min/{1.73_m2} (ref >59)
GFR calc non Af Amer: 61 mL/min/{1.73_m2} (ref >59)
Glucose: 101 mg/dL — ABNORMAL HIGH (ref 65–99)
Potassium: 4.7 mmol/L (ref 3.5–5.2)
Sodium: 140 mmol/L (ref 134–144)

## 2020-04-19 LAB — LIPID PANEL
Chol/HDL Ratio: 5.1 ratio — ABNORMAL HIGH (ref 0.0–4.4)
Cholesterol, Total: 231 mg/dL — ABNORMAL HIGH (ref 100–199)
HDL: 45 mg/dL (ref >39)
LDL Chol Calc (NIH): 136 mg/dL — ABNORMAL HIGH (ref 0–99)
Triglycerides: 278 mg/dL — ABNORMAL HIGH (ref 0–149)
VLDL Cholesterol Cal: 50 mg/dL — ABNORMAL HIGH (ref 5–40)

## 2020-04-21 ENCOUNTER — Telehealth: Payer: Self-pay | Admitting: *Deleted

## 2020-04-21 NOTE — Telephone Encounter (Addendum)
Pt contacted pre-catheterization scheduled at [facility] for: Monday April 25, 2020 7:30 AM Verified arrival time and place: [facility] Main Entrance A [facility]) at: 5:30 AM   No solid food after midnight prior to cath, clear liquids until 5 AM day of procedure.  Hold: Triamterene-HCT-AM of procedure  Except hold medications AM meds can be  taken pre-cath with sips of water including: ASA 81 mg   Confirmed patient has responsible adult to drive home post procedure and be with patient first 24 hours after arriving home: yes  You are allowed ONE visitor in the waiting room during the time you are at the hospital for your procedure. Both you and your visitor must wear a mask once you enter the hospital.   Reviewed procedure/mask/visitor instructions with patient.   04/14/20 EKG report in Care Everywhere.

## 2020-04-22 ENCOUNTER — Other Ambulatory Visit (HOSPITAL_COMMUNITY)
Admission: RE | Admit: 2020-04-22 | Discharge: 2020-04-22 | Disposition: A | Discharge status: Home / Self Care | Payer: Medicare Other | Source: Ambulatory Visit | Attending: Cardiovascular Disease | Admitting: Cardiovascular Disease

## 2020-04-22 DIAGNOSIS — Z20822 Contact with and (suspected) exposure to covid-19: Secondary | ICD-10-CM | POA: Insufficient documentation

## 2020-04-22 DIAGNOSIS — Z01812 Encounter for preprocedural laboratory examination: Secondary | ICD-10-CM | POA: Insufficient documentation

## 2020-04-23 LAB — SARS CORONAVIRUS 2 (TAT 6-24 HRS): SARS Coronavirus 2: NEGATIVE

## 2020-04-25 ENCOUNTER — Inpatient Hospital Stay (HOSPITAL_COMMUNITY): Payer: Medicare Other

## 2020-04-25 ENCOUNTER — Encounter (HOSPITAL_COMMUNITY): Payer: Self-pay | Admitting: Cardiovascular Disease

## 2020-04-25 ENCOUNTER — Encounter (HOSPITAL_COMMUNITY): Payer: Self-pay | Admitting: Certified Registered Nurse Anesthetist

## 2020-04-25 ENCOUNTER — Other Ambulatory Visit: Payer: Self-pay

## 2020-04-25 ENCOUNTER — Inpatient Hospital Stay (HOSPITAL_COMMUNITY)
Admission: RE | Admit: 2020-04-25 | Discharge: 2020-05-04 | DRG: 236 | Disposition: A | Discharge status: Home / Self Care | Payer: Medicare Other | Source: Ambulatory Visit | Attending: Cardiothoracic Surgery | Admitting: Cardiothoracic Surgery

## 2020-04-25 ENCOUNTER — Encounter (HOSPITAL_COMMUNITY)
Admission: RE | Disposition: A | Discharge status: Home / Self Care | Payer: Self-pay | Source: Ambulatory Visit | Attending: Cardiothoracic Surgery

## 2020-04-25 DIAGNOSIS — M353 Polymyalgia rheumatica: Secondary | ICD-10-CM | POA: Diagnosis present

## 2020-04-25 DIAGNOSIS — Z20822 Contact with and (suspected) exposure to covid-19: Secondary | ICD-10-CM | POA: Diagnosis not present

## 2020-04-25 DIAGNOSIS — K219 Gastro-esophageal reflux disease without esophagitis: Secondary | ICD-10-CM | POA: Diagnosis not present

## 2020-04-25 DIAGNOSIS — I480 Paroxysmal atrial fibrillation: Secondary | ICD-10-CM | POA: Diagnosis not present

## 2020-04-25 DIAGNOSIS — Z01810 Encounter for preprocedural cardiovascular examination: Secondary | ICD-10-CM | POA: Diagnosis not present

## 2020-04-25 DIAGNOSIS — J849 Interstitial pulmonary disease, unspecified: Secondary | ICD-10-CM | POA: Diagnosis not present

## 2020-04-25 DIAGNOSIS — E785 Hyperlipidemia, unspecified: Secondary | ICD-10-CM | POA: Diagnosis present

## 2020-04-25 DIAGNOSIS — E8770 Fluid overload, unspecified: Secondary | ICD-10-CM | POA: Diagnosis not present

## 2020-04-25 DIAGNOSIS — R079 Chest pain, unspecified: Secondary | ICD-10-CM

## 2020-04-25 DIAGNOSIS — E039 Hypothyroidism, unspecified: Secondary | ICD-10-CM | POA: Diagnosis present

## 2020-04-25 DIAGNOSIS — Z23 Encounter for immunization: Secondary | ICD-10-CM | POA: Diagnosis not present

## 2020-04-25 DIAGNOSIS — E872 Acidosis: Secondary | ICD-10-CM | POA: Diagnosis not present

## 2020-04-25 DIAGNOSIS — I25110 Atherosclerotic heart disease of native coronary artery with unstable angina pectoris: Secondary | ICD-10-CM | POA: Diagnosis not present

## 2020-04-25 DIAGNOSIS — R0602 Shortness of breath: Secondary | ICD-10-CM

## 2020-04-25 DIAGNOSIS — I200 Unstable angina: Secondary | ICD-10-CM | POA: Diagnosis not present

## 2020-04-25 DIAGNOSIS — I10 Essential (primary) hypertension: Secondary | ICD-10-CM | POA: Diagnosis present

## 2020-04-25 DIAGNOSIS — Z79899 Other long term (current) drug therapy: Secondary | ICD-10-CM

## 2020-04-25 DIAGNOSIS — Z96643 Presence of artificial hip joint, bilateral: Secondary | ICD-10-CM | POA: Diagnosis present

## 2020-04-25 DIAGNOSIS — Z9889 Other specified postprocedural states: Secondary | ICD-10-CM

## 2020-04-25 DIAGNOSIS — Z8249 Family history of ischemic heart disease and other diseases of the circulatory system: Secondary | ICD-10-CM | POA: Diagnosis not present

## 2020-04-25 DIAGNOSIS — Z951 Presence of aortocoronary bypass graft: Secondary | ICD-10-CM

## 2020-04-25 DIAGNOSIS — I2510 Atherosclerotic heart disease of native coronary artery without angina pectoris: Secondary | ICD-10-CM | POA: Diagnosis not present

## 2020-04-25 DIAGNOSIS — I429 Cardiomyopathy, unspecified: Secondary | ICD-10-CM | POA: Diagnosis present

## 2020-04-25 HISTORY — DX: Atherosclerotic heart disease of native coronary artery without angina pectoris: I25.10

## 2020-04-25 HISTORY — PX: RIGHT/LEFT HEART CATH AND CORONARY ANGIOGRAPHY: CATH118266

## 2020-04-25 LAB — POCT I-STAT 7, (LYTES, BLD GAS, ICA,H+H)
Acid-base deficit: 3 mmol/L — ABNORMAL HIGH (ref 0.0–2.0)
Bicarbonate: 22.2 mmol/L (ref 20.0–28.0)
Calcium, Ion: 1.29 mmol/L (ref 1.15–1.40)
HCT: 32 % — ABNORMAL LOW (ref 36.0–46.0)
Hemoglobin: 10.9 g/dL — ABNORMAL LOW (ref 12.0–15.0)
O2 Saturation: 98 %
Potassium: 3.5 mmol/L (ref 3.5–5.1)
Sodium: 141 mmol/L (ref 135–145)
TCO2: 23 mmol/L (ref 22–32)
pCO2 arterial: 37.3 mmHg (ref 32.0–48.0)
pH, Arterial: 7.383 (ref 7.350–7.450)
pO2, Arterial: 101 mmHg (ref 83.0–108.0)

## 2020-04-25 LAB — POCT I-STAT EG7
Acid-base deficit: 3 mmol/L — ABNORMAL HIGH (ref 0.0–2.0)
Bicarbonate: 22.3 mmol/L (ref 20.0–28.0)
Calcium, Ion: 1.24 mmol/L (ref 1.15–1.40)
HCT: 31 % — ABNORMAL LOW (ref 36.0–46.0)
Hemoglobin: 10.5 g/dL — ABNORMAL LOW (ref 12.0–15.0)
O2 Saturation: 81 %
Potassium: 3.4 mmol/L — ABNORMAL LOW (ref 3.5–5.1)
Sodium: 142 mmol/L (ref 135–145)
TCO2: 23 mmol/L (ref 22–32)
pCO2, Ven: 38.7 mmHg — ABNORMAL LOW (ref 44.0–60.0)
pH, Ven: 7.369 (ref 7.250–7.430)
pO2, Ven: 47 mmHg — ABNORMAL HIGH (ref 32.0–45.0)

## 2020-04-25 LAB — ECHOCARDIOGRAM COMPLETE
AR max vel: 3.13 cm2
AV Area VTI: 3.07 cm2
AV Area mean vel: 3.22 cm2
AV Mean grad: 2 mmHg
AV Peak grad: 5.1 mmHg
Ao pk vel: 1.13 m/s
Area-P 1/2: 4.57 cm2
Height: 61 in
MV VTI: 2.49 cm2
S' Lateral: 3.5 cm
Weight: 2480 oz

## 2020-04-25 SURGERY — RIGHT/LEFT HEART CATH AND CORONARY ANGIOGRAPHY
Anesthesia: LOCAL

## 2020-04-25 MED ORDER — AMLODIPINE BESYLATE 5 MG PO TABS
5.0000 mg | ORAL_TABLET | Freq: Every day | ORAL | Status: DC
  Administered 2020-04-26: 5 mg via ORAL
  Filled 2020-04-25: qty 1

## 2020-04-25 MED ORDER — SODIUM CHLORIDE 0.9 % IV SOLN: 250.0000 mL | INTRAVENOUS | Status: DC | PRN

## 2020-04-25 MED ORDER — PROMETHAZINE HCL 25 MG PO TABS
25.0000 mg | ORAL_TABLET | Freq: Four times a day (QID) | ORAL | Status: DC | PRN
  Administered 2020-04-26: 25 mg via ORAL
  Filled 2020-04-25: qty 1

## 2020-04-25 MED ORDER — NITROGLYCERIN IN D5W 200-5 MCG/ML-% IV SOLN
2.0000 ug/min | INTRAVENOUS | Status: DC
  Filled 2020-04-25: qty 250

## 2020-04-25 MED ORDER — DEXMEDETOMIDINE HCL IN NACL 400 MCG/100ML IV SOLN
0.1000 ug/kg/h | INTRAVENOUS | Status: DC
  Filled 2020-04-25: qty 100

## 2020-04-25 MED ORDER — IOHEXOL 350 MG/ML SOLN
INTRAVENOUS | Status: DC | PRN
  Administered 2020-04-25: 90 mL

## 2020-04-25 MED ORDER — TRANEXAMIC ACID (OHS) PUMP PRIME SOLUTION
2.0000 mg/kg | INTRAVENOUS | Status: DC
  Filled 2020-04-25: qty 1.41

## 2020-04-25 MED ORDER — HEPARIN (PORCINE) IN NACL 1000-0.9 UT/500ML-% IV SOLN
INTRAVENOUS | Status: AC
  Filled 2020-04-25: qty 1000

## 2020-04-25 MED ORDER — ONDANSETRON HCL 4 MG/2ML IJ SOLN
4.0000 mg | Freq: Four times a day (QID) | INTRAMUSCULAR | Status: DC | PRN
  Administered 2020-04-25 – 2020-04-26 (×2): 4 mg via INTRAVENOUS
  Filled 2020-04-25 (×2): qty 2

## 2020-04-25 MED ORDER — SODIUM CHLORIDE 0.9% FLUSH: 3.0000 mL | INTRAVENOUS | Status: DC | PRN

## 2020-04-25 MED ORDER — HEPARIN (PORCINE) IN NACL 1000-0.9 UT/500ML-% IV SOLN
INTRAVENOUS | Status: DC | PRN
  Administered 2020-04-25 (×2): 500 mL

## 2020-04-25 MED ORDER — CYCLOBENZAPRINE HCL 10 MG PO TABS
10.0000 mg | ORAL_TABLET | Freq: Three times a day (TID) | ORAL | Status: DC | PRN
Start: 2020-04-25 — End: 2020-04-29
  Administered 2020-04-26 – 2020-04-29 (×6): 10 mg via ORAL
  Filled 2020-04-25 (×8): qty 1

## 2020-04-25 MED ORDER — SODIUM CHLORIDE 0.9 % IV SOLN: INTRAVENOUS | Status: AC

## 2020-04-25 MED ORDER — PLASMA-LYTE 148 IV SOLN
INTRAVENOUS | Status: DC
  Filled 2020-04-25: qty 2.5

## 2020-04-25 MED ORDER — ISOSORBIDE MONONITRATE ER 30 MG PO TB24
15.0000 mg | ORAL_TABLET | Freq: Every day | ORAL | Status: DC
  Administered 2020-04-25 – 2020-04-26 (×2): 15 mg via ORAL
  Filled 2020-04-25 (×2): qty 1

## 2020-04-25 MED ORDER — POTASSIUM CHLORIDE 2 MEQ/ML IV SOLN
80.0000 meq | INTRAVENOUS | Status: DC
  Filled 2020-04-25: qty 40

## 2020-04-25 MED ORDER — TRANEXAMIC ACID (OHS) BOLUS VIA INFUSION
15.0000 mg/kg | INTRAVENOUS | Status: DC
  Filled 2020-04-25: qty 1055

## 2020-04-25 MED ORDER — VANCOMYCIN HCL 1250 MG/250ML IV SOLN
1250.0000 mg | INTRAVENOUS | Status: DC
  Filled 2020-04-25: qty 250

## 2020-04-25 MED ORDER — ASPIRIN 81 MG PO CHEW: 81.0000 mg | CHEWABLE_TABLET | ORAL | Status: DC

## 2020-04-25 MED ORDER — OXYCODONE HCL 5 MG PO TABS
10.0000 mg | ORAL_TABLET | Freq: Four times a day (QID) | ORAL | Status: DC | PRN
  Administered 2020-04-25 – 2020-04-26 (×4): 10 mg via ORAL
  Filled 2020-04-25 (×4): qty 2

## 2020-04-25 MED ORDER — VERAPAMIL HCL 2.5 MG/ML IV SOLN
INTRAVENOUS | Status: AC
  Filled 2020-04-25: qty 2

## 2020-04-25 MED ORDER — SODIUM CHLORIDE 0.9 % IV SOLN
1.5000 g | INTRAVENOUS | Status: DC
  Filled 2020-04-25: qty 1.5

## 2020-04-25 MED ORDER — FENTANYL CITRATE (PF) 100 MCG/2ML IJ SOLN
INTRAMUSCULAR | Status: AC
  Filled 2020-04-25: qty 2

## 2020-04-25 MED ORDER — ACETAMINOPHEN 325 MG PO TABS: 650.0000 mg | ORAL_TABLET | ORAL | Status: DC | PRN

## 2020-04-25 MED ORDER — ALBUTEROL SULFATE HFA 108 (90 BASE) MCG/ACT IN AERS
2.0000 | INHALATION_SPRAY | RESPIRATORY_TRACT | Status: DC | PRN
  Filled 2020-04-25: qty 6.7

## 2020-04-25 MED ORDER — MAGNESIUM SULFATE 50 % IJ SOLN
40.0000 meq | INTRAMUSCULAR | Status: DC
  Filled 2020-04-25: qty 9.85

## 2020-04-25 MED ORDER — SODIUM CHLORIDE 0.9 % IV SOLN
750.0000 mg | INTRAVENOUS | Status: DC
  Filled 2020-04-25: qty 750

## 2020-04-25 MED ORDER — HEPARIN SODIUM (PORCINE) 1000 UNIT/ML IJ SOLN
INTRAMUSCULAR | Status: AC
  Filled 2020-04-25: qty 1

## 2020-04-25 MED ORDER — SODIUM CHLORIDE 0.9 % WEIGHT BASED INFUSION: 1.0000 mL/kg/h | INTRAVENOUS | Status: DC

## 2020-04-25 MED ORDER — LIDOCAINE HCL (PF) 1 % IJ SOLN
INTRAMUSCULAR | Status: AC
  Filled 2020-04-25: qty 30

## 2020-04-25 MED ORDER — MILRINONE LACTATE IN DEXTROSE 20-5 MG/100ML-% IV SOLN
0.3000 ug/kg/min | INTRAVENOUS | Status: DC
  Filled 2020-04-25: qty 100

## 2020-04-25 MED ORDER — HYDRALAZINE HCL 20 MG/ML IJ SOLN: 10.0000 mg | INTRAMUSCULAR | Status: AC | PRN

## 2020-04-25 MED ORDER — DIPHENHYDRAMINE HCL 25 MG PO CAPS
50.0000 mg | ORAL_CAPSULE | Freq: Every evening | ORAL | Status: DC | PRN
  Administered 2020-04-28 – 2020-05-02 (×2): 50 mg via ORAL
  Filled 2020-04-25 (×2): qty 2

## 2020-04-25 MED ORDER — TRANEXAMIC ACID 1000 MG/10ML IV SOLN
1.5000 mg/kg/h | INTRAVENOUS | Status: DC
  Filled 2020-04-25: qty 25

## 2020-04-25 MED ORDER — TRIAMTERENE-HCTZ 37.5-25 MG PO TABS
0.5000 | ORAL_TABLET | Freq: Every day | ORAL | Status: DC
  Filled 2020-04-25: qty 0.5

## 2020-04-25 MED ORDER — LIDOCAINE HCL (PF) 1 % IJ SOLN
INTRAMUSCULAR | Status: DC | PRN
  Administered 2020-04-25 (×2): 2 mL

## 2020-04-25 MED ORDER — HEPARIN SODIUM (PORCINE) 1000 UNIT/ML IJ SOLN
INTRAMUSCULAR | Status: DC | PRN
  Administered 2020-04-25: 3500 [IU] via INTRAVENOUS

## 2020-04-25 MED ORDER — SODIUM CHLORIDE 0.9 % IV SOLN
INTRAVENOUS | Status: DC
  Filled 2020-04-25: qty 30

## 2020-04-25 MED ORDER — FENTANYL CITRATE (PF) 100 MCG/2ML IJ SOLN
INTRAMUSCULAR | Status: DC | PRN
  Administered 2020-04-25: 50 ug via INTRAVENOUS

## 2020-04-25 MED ORDER — SODIUM CHLORIDE 0.9 % WEIGHT BASED INFUSION
3.0000 mL/kg/h | INTRAVENOUS | Status: DC
  Administered 2020-04-25: 3 mL/kg/h via INTRAVENOUS

## 2020-04-25 MED ORDER — ROSUVASTATIN CALCIUM 20 MG PO TABS
20.0000 mg | ORAL_TABLET | Freq: Every day | ORAL | Status: DC
  Administered 2020-04-25 – 2020-05-04 (×9): 20 mg via ORAL
  Filled 2020-04-25 (×9): qty 1

## 2020-04-25 MED ORDER — SODIUM CHLORIDE 0.9% FLUSH
3.0000 mL | Freq: Two times a day (BID) | INTRAVENOUS | Status: DC
  Administered 2020-04-26: 3 mL via INTRAVENOUS

## 2020-04-25 MED ORDER — ASPIRIN EC 81 MG PO TBEC
81.0000 mg | DELAYED_RELEASE_TABLET | Freq: Every day | ORAL | Status: DC
  Administered 2020-04-25 – 2020-04-26 (×2): 81 mg via ORAL
  Filled 2020-04-25 (×2): qty 1

## 2020-04-25 MED ORDER — NOREPINEPHRINE 4 MG/250ML-% IV SOLN
0.0000 ug/min | INTRAVENOUS | Status: DC
  Filled 2020-04-25: qty 250

## 2020-04-25 MED ORDER — LABETALOL HCL 5 MG/ML IV SOLN
10.0000 mg | INTRAVENOUS | Status: AC | PRN
Start: 2020-04-25 — End: 2020-04-25

## 2020-04-25 MED ORDER — MANNITOL 20 % IV SOLN
Freq: Once | INTRAVENOUS | Status: DC
  Filled 2020-04-25: qty 13

## 2020-04-25 MED ORDER — INSULIN REGULAR(HUMAN) IN NACL 100-0.9 UT/100ML-% IV SOLN
INTRAVENOUS | Status: DC
  Filled 2020-04-25: qty 100

## 2020-04-25 MED ORDER — SODIUM CHLORIDE 0.9% FLUSH
3.0000 mL | Freq: Two times a day (BID) | INTRAVENOUS | Status: DC
  Administered 2020-04-25 – 2020-04-26 (×2): 3 mL via INTRAVENOUS

## 2020-04-25 MED ORDER — GABAPENTIN 100 MG PO CAPS
100.0000 mg | ORAL_CAPSULE | Freq: Every evening | ORAL | Status: DC | PRN
Start: 2020-04-25 — End: 2020-05-04
  Filled 2020-04-25: qty 1

## 2020-04-25 MED ORDER — EPINEPHRINE HCL 5 MG/250ML IV SOLN IN NS
0.0000 ug/min | INTRAVENOUS | Status: DC
  Filled 2020-04-25: qty 250

## 2020-04-25 MED ORDER — VERAPAMIL HCL 2.5 MG/ML IV SOLN
INTRAVENOUS | Status: DC | PRN
  Administered 2020-04-25: 10 mL via INTRA_ARTERIAL

## 2020-04-25 MED ORDER — MIDAZOLAM HCL 2 MG/2ML IJ SOLN
INTRAMUSCULAR | Status: AC
  Filled 2020-04-25: qty 2

## 2020-04-25 MED ORDER — MIDAZOLAM HCL 2 MG/2ML IJ SOLN
INTRAMUSCULAR | Status: DC | PRN
  Administered 2020-04-25: 2 mg via INTRAVENOUS

## 2020-04-25 MED ORDER — PHENYLEPHRINE HCL-NACL 20-0.9 MG/250ML-% IV SOLN
30.0000 ug/min | INTRAVENOUS | Status: DC
  Filled 2020-04-25: qty 250

## 2020-04-25 SURGICAL SUPPLY — 13 items
CATH 5FR JL3.5 JR4 ANG PIG MP (CATHETERS) ×2 IMPLANT
CATH BALLN WEDGE 5F 110CM (CATHETERS) ×1 IMPLANT
DEVICE RAD COMP TR BAND LRG (VASCULAR PRODUCTS) ×1 IMPLANT
GLIDESHEATH SLEND SS 6F .021 (SHEATH) ×1 IMPLANT
GUIDEWIRE INQWIRE 1.5J.035X260 (WIRE) IMPLANT
INQWIRE 1.5J .035X260CM (WIRE) ×2
KIT HEART LEFT (KITS) ×2 IMPLANT
PACK CARDIAC CATHETERIZATION (CUSTOM PROCEDURE TRAY) ×2 IMPLANT
SHEATH GLIDE SLENDER 4/5FR (SHEATH) ×1 IMPLANT
SHEATH PROBE COVER 6X72 (BAG) ×1 IMPLANT
SYR MEDRAD MARK 7 150ML (SYRINGE) ×1 IMPLANT
TRANSDUCER W/STOPCOCK (MISCELLANEOUS) ×2 IMPLANT
TUBING CIL FLEX 10 FLL-RA (TUBING) ×2 IMPLANT

## 2020-04-25 NOTE — Research (Signed)
[place] Informed Consent   Subject Name: [name]  Subject met inclusion and exclusion criteria.  The informed consent form, study requirements and expectations were reviewed with the subject and questions and concerns were addressed prior to the signing of the consent form.  The subject verbalized understanding of the trail requirements.  The subject agreed to participate in the [facility] trial and signed the informed consent.  The informed consent was obtained prior to performance of any protocol-specific procedures for the subject.  A copy of the signed informed consent was given to the subject and a copy was placed in the subject's medical record.  [name] 04/25/2020, 06:45 am

## 2020-04-25 NOTE — Progress Notes (Signed)
  Echocardiogram 2D Echocardiogram has been performed.  Gerda Diss 04/25/2020, 4:21 PM

## 2020-04-25 NOTE — Interval H&P Note (Signed)
History and Physical Interval Note:  04/25/2020 7:15 AM  [name]  has presented today for surgery, with the diagnosis of abnormal ekg - chest pain - sob.  The various methods of treatment have been discussed with the patient and family. After consideration of risks, benefits and other options for treatment, the patient has consented to  Procedure(s): RIGHT/LEFT HEART CATH AND CORONARY ANGIOGRAPHY (N/A) as a surgical intervention.  The patient's history has been reviewed, patient examined, no change in status, stable for surgery.  I have reviewed the patient's chart and labs.  Questions were answered to the patient's satisfaction.    Cath Lab Visit (complete for each Cath Lab visit)  Clinical Evaluation Leading to the Procedure:   ACS: No.  Non-ACS:    Anginal Classification: CCS III  Anti-ischemic medical therapy: Minimal Therapy (1 class of medications)  Non-Invasive Test Results: No non-invasive testing performed  Prior CABG: No previous CABG        [name]

## 2020-04-25 NOTE — Progress Notes (Signed)
TCTS consulted for CABG evaluation. °
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
## 2020-04-25 NOTE — Procedures (Signed)
Echo attempted in cath lab holding. Patient talking to surgeon. Nurse stated patient has room ready once conversation completed. Will attempt once patient reaches room.

## 2020-04-25 NOTE — Progress Notes (Signed)
Pre-CABG testing has been completed. Preliminary results can be found in CV Proc through chart review.   04/25/20 2:41 PM Olen Cordial RVT

## 2020-04-25 NOTE — Progress Notes (Signed)
Cardiology Note:   Pt has been seen by CT surgery. Dr. Vickey Sages is planning her CABG. This could be done tomorrow but the patient states that she must go home before having surgery. We are planning to admit her tonight to have her echo completed. If this is done at a reasonable hour, we could let her go home tonight. If she goes home tonight, she will be contacted by the CT surgery office to schedule her CABG.   She is being moved from the cath lab recovery area up to 6E now.   I will start Imdur 15 mg once daily. First dose now.   Verne Carrow 04/25/2020 2:55 PM

## 2020-04-25 NOTE — Progress Notes (Signed)
Removed TR Band @ 1145, site Level 0.  Gauze and Tegaderm applied.  Patient educated not to use right hand an wrist and to notify RN if anything changes with her wrist.  Lynann Beaver RT R, VI

## 2020-04-26 ENCOUNTER — Inpatient Hospital Stay (HOSPITAL_COMMUNITY): Payer: Medicare Other

## 2020-04-26 ENCOUNTER — Encounter (HOSPITAL_COMMUNITY): Payer: Self-pay | Admitting: Cardiovascular Disease

## 2020-04-26 ENCOUNTER — Encounter (HOSPITAL_COMMUNITY)
Admission: RE | Disposition: A | Discharge status: Home / Self Care | Payer: Self-pay | Source: Ambulatory Visit | Attending: Cardiothoracic Surgery

## 2020-04-26 DIAGNOSIS — I200 Unstable angina: Secondary | ICD-10-CM

## 2020-04-26 LAB — CBC
HCT: 34 % — ABNORMAL LOW (ref 36.0–46.0)
Hemoglobin: 11.6 g/dL — ABNORMAL LOW (ref 12.0–15.0)
MCH: 30.9 pg (ref 26.0–34.0)
MCHC: 34.1 g/dL (ref 30.0–36.0)
MCV: 90.4 fL (ref 80.0–100.0)
Platelets: 292 10*3/uL (ref 150–400)
RBC: 3.76 MIL/uL — ABNORMAL LOW (ref 3.87–5.11)
RDW: 13.1 % (ref 11.5–15.5)
WBC: 7.8 10*3/uL (ref 4.0–10.5)
nRBC: 0 % (ref 0.0–0.2)

## 2020-04-26 LAB — BASIC METABOLIC PANEL
Anion gap: 8 (ref 5–15)
BUN: 10 mg/dL (ref 8–23)
CO2: 23 mmol/L (ref 22–32)
Calcium: 10.1 mg/dL (ref 8.9–10.3)
Chloride: 108 mmol/L (ref 98–111)
Creatinine, Ser: 0.89 mg/dL (ref 0.44–1.00)
GFR, Estimated: >60 mL/min (ref >60)
Glucose, Bld: 107 mg/dL — ABNORMAL HIGH (ref 70–99)
Potassium: 4 mmol/L (ref 3.5–5.1)
Sodium: 139 mmol/L (ref 135–145)

## 2020-04-26 SURGERY — CORONARY ARTERY BYPASS GRAFTING (CABG)
Anesthesia: General | Site: Chest

## 2020-04-26 MED ORDER — TRANEXAMIC ACID (OHS) PUMP PRIME SOLUTION
2.0000 mg/kg | INTRAVENOUS | Status: DC
  Filled 2020-04-26: qty 1.41

## 2020-04-26 MED ORDER — VANCOMYCIN HCL 1250 MG/250ML IV SOLN
1250.0000 mg | INTRAVENOUS | Status: AC
  Administered 2020-04-27: 1250 mg via INTRAVENOUS
  Filled 2020-04-26: qty 250

## 2020-04-26 MED ORDER — SODIUM CHLORIDE 0.9 % IV SOLN
1.5000 g | INTRAVENOUS | Status: AC
  Administered 2020-04-27: 1.5 g via INTRAVENOUS
  Filled 2020-04-26: qty 1.5

## 2020-04-26 MED ORDER — POTASSIUM CHLORIDE 2 MEQ/ML IV SOLN
80.0000 meq | INTRAVENOUS | Status: DC
  Filled 2020-04-26: qty 40

## 2020-04-26 MED ORDER — SODIUM CHLORIDE 0.9 % IV SOLN
INTRAVENOUS | Status: DC
  Filled 2020-04-26: qty 30

## 2020-04-26 MED ORDER — TRANEXAMIC ACID (OHS) BOLUS VIA INFUSION
15.0000 mg/kg | INTRAVENOUS | Status: AC
  Administered 2020-04-27: 1054.5 mg via INTRAVENOUS
  Filled 2020-04-26: qty 1055

## 2020-04-26 MED ORDER — EPINEPHRINE HCL 5 MG/250ML IV SOLN IN NS
0.0000 ug/min | INTRAVENOUS | Status: DC
  Filled 2020-04-26: qty 250

## 2020-04-26 MED ORDER — SODIUM CHLORIDE 0.9 % IV SOLN
750.0000 mg | INTRAVENOUS | Status: AC
  Administered 2020-04-27: 750 mg via INTRAVENOUS
  Filled 2020-04-26: qty 750

## 2020-04-26 MED ORDER — METOPROLOL TARTRATE 12.5 MG HALF TABLET
12.5000 mg | ORAL_TABLET | Freq: Once | ORAL | Status: AC
  Administered 2020-04-27: 12.5 mg via ORAL
  Filled 2020-04-26: qty 1

## 2020-04-26 MED ORDER — MAGNESIUM SULFATE 50 % IJ SOLN
40.0000 meq | INTRAMUSCULAR | Status: DC
  Filled 2020-04-26: qty 9.85

## 2020-04-26 MED ORDER — BISACODYL 5 MG PO TBEC
5.0000 mg | DELAYED_RELEASE_TABLET | Freq: Once | ORAL | Status: AC
  Administered 2020-04-26: 5 mg via ORAL
  Filled 2020-04-26: qty 1

## 2020-04-26 MED ORDER — CHLORHEXIDINE GLUCONATE 0.12 % MT SOLN
15.0000 mL | Freq: Once | OROMUCOSAL | Status: AC
  Administered 2020-04-27: 15 mL via OROMUCOSAL
  Filled 2020-04-26: qty 15

## 2020-04-26 MED ORDER — NOREPINEPHRINE 4 MG/250ML-% IV SOLN
0.0000 ug/min | INTRAVENOUS | Status: DC
  Filled 2020-04-26: qty 250

## 2020-04-26 MED ORDER — PHENYLEPHRINE HCL-NACL 20-0.9 MG/250ML-% IV SOLN
30.0000 ug/min | INTRAVENOUS | Status: AC
  Administered 2020-04-27: 10 ug/min via INTRAVENOUS
  Filled 2020-04-26: qty 250

## 2020-04-26 MED ORDER — MILRINONE LACTATE IN DEXTROSE 20-5 MG/100ML-% IV SOLN
0.3000 ug/kg/min | INTRAVENOUS | Status: DC
  Filled 2020-04-26: qty 100

## 2020-04-26 MED ORDER — TEMAZEPAM 15 MG PO CAPS
15.0000 mg | ORAL_CAPSULE | Freq: Once | ORAL | Status: AC | PRN
  Administered 2020-04-27: 15 mg via ORAL
  Filled 2020-04-26: qty 1

## 2020-04-26 MED ORDER — CHLORHEXIDINE GLUCONATE CLOTH 2 % EX PADS
6.0000 | MEDICATED_PAD | Freq: Once | CUTANEOUS | Status: AC
  Administered 2020-04-26: 6 via TOPICAL

## 2020-04-26 MED ORDER — DEXMEDETOMIDINE HCL IN NACL 400 MCG/100ML IV SOLN
0.1000 ug/kg/h | INTRAVENOUS | Status: AC
  Administered 2020-04-27: .7 ug/kg/h via INTRAVENOUS
  Filled 2020-04-26: qty 100

## 2020-04-26 MED ORDER — PLASMA-LYTE 148 IV SOLN
INTRAVENOUS | Status: DC
  Filled 2020-04-26: qty 2.5

## 2020-04-26 MED ORDER — CARVEDILOL 3.125 MG PO TABS
3.1250 mg | ORAL_TABLET | Freq: Two times a day (BID) | ORAL | Status: DC
Start: 2020-04-26 — End: 2020-04-27
  Administered 2020-04-26 (×2): 3.125 mg via ORAL
  Filled 2020-04-26 (×2): qty 1

## 2020-04-26 MED ORDER — INSULIN REGULAR(HUMAN) IN NACL 100-0.9 UT/100ML-% IV SOLN
INTRAVENOUS | Status: AC
  Administered 2020-04-27: .8 [IU]/h via INTRAVENOUS
  Filled 2020-04-26: qty 100

## 2020-04-26 MED ORDER — NITROGLYCERIN IN D5W 200-5 MCG/ML-% IV SOLN
2.0000 ug/min | INTRAVENOUS | Status: AC
  Administered 2020-04-27: 16.6 ug/min via INTRAVENOUS
  Filled 2020-04-26: qty 250

## 2020-04-26 MED ORDER — TRANEXAMIC ACID 1000 MG/10ML IV SOLN
1.5000 mg/kg/h | INTRAVENOUS | Status: AC
  Administered 2020-04-27: 1.5 mg/kg/h via INTRAVENOUS
  Filled 2020-04-26: qty 25

## 2020-04-26 MED ORDER — CHLORHEXIDINE GLUCONATE CLOTH 2 % EX PADS
6.0000 | MEDICATED_PAD | Freq: Once | CUTANEOUS | Status: AC
  Administered 2020-04-27: 6 via TOPICAL

## 2020-04-26 MED ORDER — ENOXAPARIN SODIUM 40 MG/0.4ML ~~LOC~~ SOLN
40.0000 mg | SUBCUTANEOUS | Status: DC
  Administered 2020-04-26: 40 mg via SUBCUTANEOUS
  Filled 2020-04-26: qty 0.4

## 2020-04-26 NOTE — Progress Notes (Signed)
[id number] Gave pt [facility] booklet, care guide and staying in the tube handout. Wrote down how to view pre op video. Gave pt IS and she demonstrated 1000 ml correctly. Discussed importance of IS and mobility after surgery. Discussed sternal precautions and staying in the tube. Pt would like to have permission to have both her daughters come up so she can explain surgery to her special needs daughter. She stated she would stay for surgery if this could be arranged. Her daughter lost her father to cardiac issues. Notified Asst Director and cardiology NP of this concern. Helped pt order her breakfast. Will continue to follow. Did not walk due to LM disease. Pt voiced understanding of ed. Her daughter who is a Runner, broadcasting/film/video will be able to stay with her after discharge. Luetta Nutting RN BSN 04/26/2020 9:31 AM

## 2020-04-26 NOTE — Progress Notes (Addendum)
Progress Note  Patient Name: [name] Date of Encounter: 04/26/2020  [facility] Cardiologist: Dietrich Pates, MD   Subjective   Doing better this morning. No chest pain overnight. She was able to sort out arrangements for her daughter and is agreeable to surgery.  Inpatient Medications    Scheduled Meds: . amLODipine  5 mg Oral Daily  . aspirin EC  81 mg Oral Daily  . isosorbide mononitrate  15 mg Oral Daily  . rosuvastatin  20 mg Oral Daily  . sodium chloride flush  3 mL Intravenous Q12H  . sodium chloride flush  3 mL Intravenous Q12H  . triamterene-hydrochlorothiazide  0.5 tablet Oral Daily   Continuous Infusions: . sodium chloride     PRN Meds: sodium chloride, acetaminophen, albuterol, cyclobenzaprine, diphenhydrAMINE, gabapentin, ondansetron (ZOFRAN) IV, oxyCODONE, promethazine, sodium chloride flush   Vital Signs    Vitals:   04/25/20 1936 04/26/20 0036 04/26/20 0507 04/26/20 0740  BP: 120/70 118/69 123/73 123/67  Pulse: 82 85 79 76  Resp: 18 17 18 15   Temp: 97.7 F (36.5 C) 97.8 F (36.6 C) 97.9 F (36.6 C) 97.8 F (36.6 C)  TempSrc: Oral Oral Oral Oral  SpO2: 99% 99% 96% 98%  Weight:      Height:        Intake/Output Summary (Last 24 hours) at 04/26/2020 0907 Last data filed at 04/25/2020 1808 Gross per 24 hour  Intake 707.5 ml  Output -  Net 707.5 ml   Last 3 Weights 04/25/2020 04/18/2020 05/13/2012  Weight (lbs) 155 lb 155 lb 12.8 oz 190 lb  Weight (kg) 70.308 kg 70.67 kg 86.183 kg      Telemetry    SR - Personally Reviewed  ECG    SR - Personally Reviewed  Physical Exam   GEN: No acute distress.   Neck: No JVD Cardiac: RRR, no murmurs, rubs, or gallops.  Respiratory: Clear to auscultation bilaterally. GI: Soft, nontender, non-distended  MS: No edema; No deformity. Right radial cath site stable Neuro:  Nonfocal  Psych: Normal affect   Labs    High Sensitivity Troponin:  No results for input(s): TROPONINIHS in the last 720 hours.     Chemistry Recent Labs  Lab 04/25/20 0756 04/25/20 0803 04/26/20 0204  NA 142 141 139  K 3.4* 3.5 4.0  CL  --   --  108  CO2  --   --  23  GLUCOSE  --   --  107*  BUN  --   --  10  CREATININE  --   --  0.89  CALCIUM  --   --  10.1  GFRNONAA  --   --  >60  ANIONGAP  --   --  8     Hematology Recent Labs  Lab 04/25/20 0756 04/25/20 0803 04/26/20 0204  WBC  --   --  7.8  RBC  --   --  3.76*  HGB 10.5* 10.9* 11.6*  HCT 31.0* 32.0* 34.0*  MCV  --   --  90.4  MCH  --   --  30.9  MCHC  --   --  34.1  RDW  --   --  13.1  PLT  --   --  292    BNPNo results for input(s): BNP, PROBNP in the last 168 hours.   DDimer No results for input(s): DDIMER in the last 168 hours.   Radiology    CARDIAC CATHETERIZATION  Result Date: 04/25/2020  Mid  RCA lesion is 20% stenosed.  Mid LM to Dist LM lesion is 50% stenosed.  Ost LAD to Prox LAD lesion is 80% stenosed.  Ost Cx to Prox Cx lesion is 50% stenosed.  The left ventricular systolic function is normal.  LV end diastolic pressure is normal.  The left ventricular ejection fraction is 55-65% by visual estimate.  There is no mitral valve regurgitation.  1. Moderate distal left main stenosis 2. Severe disease in the ostium of the large caliber LAD extending into the proximal vessel. 3. Moderate stenosis in the ostium of the large caliber Circumflex artery. The small to moderate caliber intermediate vessel has moderate proximal disease. 4. Mild non-obstructive disease in the mid RCA 5. Normal LV systolic function Recommendations: She has complex disease involving the distal left main artery, ostial and proximal LAD, ostial Circumflex. I have reviewed her films with our IC team and there is agreement that PCI would be high risk given the complexity of the disease and potential to lose flow into the Circumflex artery if we attempt stenting of the LAD back to the ostium given the plaque burden in the left main and Circumflex. Will ask CT  surgery to see her to discuss CABG. I will start a statin. She has possible interstitial lung disease. She has had recent PFTs and [facility] res CT [facility].   ECHOCARDIOGRAM COMPLETE  Result Date: 04/25/2020    ECHOCARDIOGRAM REPORT   Patient Name:   [name] Date of Exam: 04/25/2020 Medical Rec #:  [id number]   Height:       61.0 in Accession #:    [id number]  Weight:       155.0 lb Date of Birth:  06-18-1955   BSA:          1.695 m Patient Age:    64 years    BP:           129/62 mmHg Patient Gender: F           HR:           84 bpm. Exam Location:  Inpatient Procedure: 2D Echo, Cardiac Doppler and Color Doppler Indications:   CAD-Native Vessel  History:       Patient has no prior history of Echocardiogram examinations.                Angina.  Sonographer:   Ross LudwigArthur Guy RDCS (AE) Referring      [id number] BROADUS Z ATKINS Phys: IMPRESSIONS  1. Left ventricular ejection fraction, by estimation, is 60 to 65%. The left ventricle has normal function. The left ventricle has no regional wall motion abnormalities. Left ventricular diastolic parameters are consistent with Grade I diastolic dysfunction (impaired relaxation).  2. Right ventricular systolic function is normal. The right ventricular size is normal. There is normal pulmonary artery systolic pressure. The estimated right ventricular systolic pressure is 28.0 mmHg.  3. The mitral valve is normal in structure. Trivial mitral valve regurgitation. No evidence of mitral stenosis.  4. The aortic valve is tricuspid. Aortic valve regurgitation is not visualized. Mild aortic valve sclerosis is present, with no evidence of aortic valve stenosis.  5. Aortic dilatation noted. There is mild dilatation of the aortic root, measuring 38 mm.  6. The inferior vena cava is normal in size with greater than 50% respiratory variability, suggesting right atrial pressure of 3 mmHg. FINDINGS  Left Ventricle: Left ventricular ejection fraction, by  estimation, is 60 to  65%. The left ventricle has normal function. The left ventricle has no regional wall motion abnormalities. The left ventricular internal cavity size was normal in size. There is  no left ventricular hypertrophy. Left ventricular diastolic parameters are consistent with Grade I diastolic dysfunction (impaired relaxation). Normal left ventricular filling pressure. Right Ventricle: The right ventricular size is normal. No increase in right ventricular wall thickness. Right ventricular systolic function is normal. There is normal pulmonary artery systolic pressure. The tricuspid regurgitant velocity is 2.50 m/s, and  with an assumed right atrial pressure of 3 mmHg, the estimated right ventricular systolic pressure is 28.0 mmHg. Left Atrium: Left atrial size was normal in size. Right Atrium: Right atrial size was normal in size. Pericardium: There is no evidence of pericardial effusion. Mitral Valve: The mitral valve is normal in structure. Trivial mitral valve regurgitation. No evidence of mitral valve stenosis. MV peak gradient, 4.5 mmHg. The mean mitral valve gradient is 1.0 mmHg. Tricuspid Valve: The tricuspid valve is normal in structure. Tricuspid valve regurgitation is trivial. No evidence of tricuspid stenosis. Aortic Valve: The aortic valve is tricuspid. Aortic valve regurgitation is not visualized. Mild aortic valve sclerosis is present, with no evidence of aortic valve stenosis. Aortic valve mean gradient measures 2.0 mmHg. Aortic valve peak gradient measures 5.1 mmHg. Aortic valve area, by VTI measures 3.07 cm. Pulmonic Valve: The pulmonic valve was normal in structure. Pulmonic valve regurgitation is not visualized. No evidence of pulmonic stenosis. Aorta: Aortic dilatation noted. There is mild dilatation of the aortic root, measuring 38 mm. Venous: The inferior vena cava is normal in size with greater than 50% respiratory variability, suggesting right atrial pressure of 3 mmHg.  IAS/Shunts: The interatrial septum appears to be lipomatous. No atrial level shunt detected by color flow Doppler.  LEFT VENTRICLE PLAX 2D LVIDd:         4.90 cm  Diastology LVIDs:         3.50 cm  LV e' medial:    7.83 cm/s LV PW:         1.00 cm  LV E/e' medial:  7.5 LV IVS:        1.10 cm  LV e' lateral:   9.57 cm/s LVOT diam:     2.10 cm  LV E/e' lateral: 6.1 LV SV:         62 LV SV Index:   37 LVOT Area:     3.46 cm  RIGHT VENTRICLE             IVC RV Basal diam:  3.80 cm     IVC diam: 1.60 cm RV Mid diam:    2.70 cm RV S prime:     16.10 cm/s TAPSE (M-mode): 2.8 cm LEFT ATRIUM             Index       RIGHT ATRIUM           Index LA diam:        2.00 cm 1.18 cm/m  RA Area:     12.10 cm LA Vol (A2C):   43.3 ml 25.55 ml/m RA Volume:   30.60 ml  18.05 ml/m LA Vol (A4C):   52.0 ml 30.68 ml/m LA Biplane Vol: 48.0 ml 28.32 ml/m  AORTIC VALVE AV Area (Vmax):    3.13 cm AV Area (Vmean):   3.22 cm AV Area (VTI):     3.07 cm AV Vmax:           113.00  cm/s AV Vmean:          71.400 cm/s AV VTI:            0.203 m AV Peak Grad:      5.1 mmHg AV Mean Grad:      2.0 mmHg LVOT Vmax:         102.00 cm/s LVOT Vmean:        66.400 cm/s LVOT VTI:          0.180 m LVOT/AV VTI ratio: 0.89  AORTA Ao Root diam: 3.80 cm Ao Asc diam:  3.50 cm MITRAL VALVE               TRICUSPID VALVE MV Area (PHT): 4.57 cm    TR Peak grad:   25.0 mmHg MV Area VTI:   2.49 cm    TR Vmax:        250.00 cm/s MV Peak grad:  4.5 mmHg MV Mean grad:  1.0 mmHg    SHUNTS MV Vmax:       1.06 m/s    Systemic VTI:  0.18 m MV Vmean:      54.9 cm/s   Systemic Diam: 2.10 cm MV Decel Time: 166 msec MV E velocity: 58.70 cm/s MV A velocity: 94.70 cm/s MV E/A ratio:  0.62 Armanda Magic MD Electronically signed by Armanda Magic MD Signature Date/Time: 04/25/2020/6:41:28 PM    Final    VAS US DOPPLER PRE CABG  Result Date: 04/25/2020 PREOPERATIVE VASCULAR EVALUATION  Indications:      Pre-CABG. Risk Factors:     Hypertension, hyperlipidemia. Comparison Study:  No prior studies. Performing Technologist: Olen Cordial RVT Supporting Technologist: Blanch Media RVS  Examination Guidelines: A complete evaluation includes B-mode imaging, spectral Doppler, color Doppler, and power Doppler as needed of all accessible portions of each vessel. Bilateral testing is considered an integral part of a complete examination. Limited examinations for reoccurring indications may be performed as noted.  Right Carotid Findings: +----------+--------+--------+--------+------------+--------+           PSV cm/sEDV cm/sStenosisDescribe    Comments +----------+--------+--------+--------+------------+--------+ CCA Prox  74      16              heterogenous         +----------+--------+--------+--------+------------+--------+ CCA Distal70      18              heterogenous         +----------+--------+--------+--------+------------+--------+ ICA Prox  53      16      1-39%   heterogenous         +----------+--------+--------+--------+------------+--------+ ICA Distal102     34                                   +----------+--------+--------+--------+------------+--------+ ECA       84      12                                   +----------+--------+--------+--------+------------+--------+ +----------+--------+-------+--------+------------+           PSV cm/sEDV cmsDescribeArm Pressure +----------+--------+-------+--------+------------+ Subclavian68                                  +----------+--------+-------+--------+------------+ +---------+--------+--+--------+--+---------+ VertebralPSV cm/s35EDV cm/s10Antegrade +---------+--------+--+--------+--+---------+ Left Carotid Findings: +----------+--------+--------+--------+------------+--------+  PSV cm/sEDV cm/sStenosisDescribe    Comments +----------+--------+--------+--------+------------+--------+ CCA Prox  63      13              heterogenous          +----------+--------+--------+--------+------------+--------+ CCA Distal73      24              heterogenous         +----------+--------+--------+--------+------------+--------+ ICA Prox  87      27      1-39%   heterogenous         +----------+--------+--------+--------+------------+--------+ ICA Distal94      37                                   +----------+--------+--------+--------+------------+--------+ ECA       125     24                                   +----------+--------+--------+--------+------------+--------+ +----------+--------+--------+--------+------------+ SubclavianPSV cm/sEDV cm/sDescribeArm Pressure +----------+--------+--------+--------+------------+           78                                   +----------+--------+--------+--------+------------+ +---------+--------+--+--------+--+---------+ VertebralPSV cm/s38EDV cm/s15Antegrade +---------+--------+--+--------+--+---------+  ABI Findings: +--------+------------------+-----+---------+--------+ Right   Rt Pressure (mmHg)IndexWaveform Comment  +--------+------------------+-----+---------+--------+ Brachial                                TR Band  +--------+------------------+-----+---------+--------+ PTA     161               1.13 triphasic         +--------+------------------+-----+---------+--------+ DP      151               1.06 triphasic         +--------+------------------+-----+---------+--------+ +--------+------------------+-----+---------+-------+ Left    Lt Pressure (mmHg)IndexWaveform Comment +--------+------------------+-----+---------+-------+ [id number]                    triphasic        +--------+------------------+-----+---------+-------+ PTA     166               1.17 triphasic        +--------+------------------+-----+---------+-------+ DP      160               1.13 triphasic         +--------+------------------+-----+---------+-------+ +-------+---------------+----------------+ ABI/TBIToday's ABI/TBIPrevious ABI/TBI +-------+---------------+----------------+ Right  1.13                            +-------+---------------+----------------+ Left   1.17                            +-------+---------------+----------------+  Right Doppler Findings: +--------+--------+-----+-------+--------+ Site    PressureIndexDopplerComments +--------+--------+-----+-------+--------+ Brachial                    TR Band  +--------+--------+-----+-------+--------+  Left Doppler Findings: +--------+--------+-----+---------+--------+ Site    PressureIndexDoppler  Comments +--------+--------+-----+---------+--------+ [id number]          triphasic         +--------+--------+-----+---------+--------+ Radial  biphasic          +--------+--------+-----+---------+--------+ Ulnar                biphasic          +--------+--------+-----+---------+--------+ Technologist Notes: Unable to obtain pressures in the presence of a TR band.  Summary: Right Carotid: Velocities in the right ICA are consistent with a 1-39% stenosis. Left Carotid: Velocities in the left ICA are consistent with a 1-39% stenosis. Vertebrals: Bilateral vertebral arteries demonstrate antegrade flow. Right ABI: Resting right ankle-brachial index is within normal range. No evidence of significant right lower extremity arterial disease. Left ABI: Resting left ankle-brachial index is within normal range. No evidence of significant left lower extremity arterial disease. Left Upper Extremity: Doppler waveforms decrease >50% with left radial compression. Doppler waveform obliterate with left ulnar compression.  Electronically signed by Sherald Hess MD on 04/25/2020 at 4:01:52 PM.    Final     Cardiac Studies   Cath: 04/25/20    Mid RCA lesion is 20% stenosed.  Mid LM to Dist LM lesion is 50%  stenosed.  Ost LAD to Prox LAD lesion is 80% stenosed.  Ost Cx to Prox Cx lesion is 50% stenosed.  The left ventricular systolic function is normal.  LV end diastolic pressure is normal.  The left ventricular ejection fraction is 55-65% by visual estimate.  There is no mitral valve regurgitation.   1. Moderate distal left main stenosis 2. Severe disease in the ostium of the large caliber LAD extending into the proximal vessel.  3. Moderate stenosis in the ostium of the large caliber Circumflex artery. The small to moderate caliber intermediate vessel has moderate proximal disease.  4. Mild non-obstructive disease in the mid RCA 5. Normal LV systolic function  Recommendations: She has complex disease involving the distal left main artery, ostial and proximal LAD, ostial Circumflex. I have reviewed her films with our IC team and there is agreement that PCI would be high risk given the complexity of the disease and potential to lose flow into the Circumflex artery if we attempt stenting of the LAD back to the ostium given the plaque burden in the left main and Circumflex. Will ask CT surgery to see her to discuss CABG. I will start a statin. She has possible interstitial lung disease. She has had recent PFTs and high res CT chest in the [facility] health [facility].   Diagnostic Dominance: Right   Echo: 04/25/20  IMPRESSIONS    1. Left ventricular ejection fraction, by estimation, is 60 to 65%. The  left ventricle has normal function. The left ventricle has no regional  wall motion abnormalities. Left ventricular diastolic parameters are  consistent with Grade I diastolic  dysfunction (impaired relaxation).  2. Right ventricular systolic function is normal. The right ventricular  size is normal. There is normal pulmonary artery systolic pressure. The  estimated right ventricular systolic pressure is 28.0 mmHg.  3. The mitral valve is normal in structure. Trivial mitral  valve  regurgitation. No evidence of mitral stenosis.  4. The aortic valve is tricuspid. Aortic valve regurgitation is not  visualized. Mild aortic valve sclerosis is present, with no evidence of  aortic valve stenosis.  5. Aortic dilatation noted. There is mild dilatation of the aortic root,  measuring 38 mm.  6. The inferior vena cava is normal in size with greater than 50%  respiratory variability, suggesting right atrial pressure of 3 mmHg.   Patient Profile  65 y.o. female with PMH of HTN, OA, polymyalgia rheumatica who presented for outpatient cardiac cath noted above.  Assessment & Plan    1. CAD: underwent cardiac cath noted above with moderate distal LM, and severe disease of the ostium of large caliber LAD extending into the proximal vessel. Moderate stenosis in the ostium of the Lcx. TCTS consulted with initial plan for CABG today but she was not agreeable at that time as she has a special needs daughter and had to make arrangements for her. This has been done and she would like to proceed with surgery. Will reach out to TCTS today about rescheduling -- continue ASA, statin, Imdur, norvasc and start coreg 3.125mg  BID  2. HTN: stop Maxzide to allow for addition of coreg today  3. HLD: not on statin prior to admission. LDL 136 (1/24) -- started on Crestor 20mg  daily  For questions or updates, please contact [facility] Please consult [email or web address] for contact info under     Signed, , NP  04/26/2020, 9:07 AM    Patient seen, examined. Available data reviewed. Agree with findings, assessment, and plan as outlined by 06/24/2020, NP.  On exam the patient is awake and alert, in no distress.  Lungs are clear, heart is regular rate and rhythm with no murmur gallop, abdomen is soft and nontender, extremities have no edema.  Cardiac catheterization films reviewed.  Agree with medical program outlined above.  Plans for CABG tomorrow.  The patient will be able  to see her daughter today who has special needs.  She wanted to communicate with her and tell her about her need for surgery before proceeding.  Arrangements have been made for her daughter to visit the hospital today.  Dr. Laverda Page to finalize plans for CABG, tentatively scheduled tomorrow morning.  We will continue current medications.  Vickey Sages, M.D. 04/26/2020 10:55 AM

## 2020-04-26 NOTE — Anesthesia Preprocedure Evaluation (Addendum)
Anesthesia Evaluation  Patient identified by MRN, date of birth, ID band Patient awake    Reviewed: Allergy & Precautions, NPO status , Patient's Chart, lab work & pertinent test results  History of Anesthesia Complications (+) PONV and history of anesthetic complications  Airway Mallampati: II  TM Distance: >3 FB Neck ROM: Full    Dental  (+) Dental Advisory Given, Teeth Intact   Pulmonary neg pulmonary ROS,    Pulmonary exam normal        Cardiovascular hypertension, Pt. on medications + angina + CAD  Normal cardiovascular exam   '22 TTE - EF 60 to 65%. Grade I diastolic dysfunction (impaired relaxation). Trivial MR. Mild aortic valve sclerosis is present, with no evidence of aortic valve stenosis. There is mild dilatation of the aortic root, measuring 38 mm.   '22 Carotid US - 1-39% b/l ICAS  '22 Cath - Mid RCA lesion is 20% stenosed. Mid LM to Dist LM lesion is 50% stenosed. Ost LAD to Prox LAD lesion is 80% stenosed. Ost Cx to Prox Cx lesion is 50% stenosed. The left ventricular systolic function is normal. LV end diastolic pressure is normal. The left ventricular ejection fraction is 55-65% by visual estimate. There is no mitral valve regurgitation.     Neuro/Psych  Headaches, negative psych ROS   GI/Hepatic Neg liver ROS, GERD  Controlled,  Endo/Other  negative endocrine ROS  Renal/GU negative Renal ROS     Musculoskeletal  (+) Arthritis  (PMR),   Abdominal   Peds  Hematology  (+) anemia ,   Anesthesia Other Findings Covid test negative 04/22/20   Reproductive/Obstetrics                            Anesthesia Physical Anesthesia Plan  ASA: IV  Anesthesia Plan: General   Post-op Pain Management:    Induction: Intravenous  PONV Risk Score and Plan: 4 or greater and Treatment may vary due to age or medical condition  Airway Management Planned: Oral  ETT  Additional Equipment: Arterial line, CVP, PA Cath, TEE and Ultrasound Guidance Line Placement  Intra-op Plan:   Post-operative Plan: Post-operative intubation/ventilation  Informed Consent: I have reviewed the patients History and Physical, chart, labs and discussed the procedure including the risks, benefits and alternatives for the proposed anesthesia with the patient or authorized representative who has indicated his/her understanding and acceptance.     Dental advisory given  Plan Discussed with: CRNA and Anesthesiologist  Anesthesia Plan Comments:        Anesthesia Quick Evaluation

## 2020-04-27 ENCOUNTER — Inpatient Hospital Stay (HOSPITAL_COMMUNITY): Payer: Medicare Other | Admitting: Anesthesiology

## 2020-04-27 ENCOUNTER — Inpatient Hospital Stay (HOSPITAL_COMMUNITY): Payer: Medicare Other

## 2020-04-27 ENCOUNTER — Inpatient Hospital Stay (HOSPITAL_COMMUNITY)
Admission: RE | Disposition: A | Discharge status: Home / Self Care | Payer: Self-pay | Source: Ambulatory Visit | Attending: Cardiothoracic Surgery

## 2020-04-27 DIAGNOSIS — Z951 Presence of aortocoronary bypass graft: Secondary | ICD-10-CM

## 2020-04-27 DIAGNOSIS — I2510 Atherosclerotic heart disease of native coronary artery without angina pectoris: Secondary | ICD-10-CM

## 2020-04-27 HISTORY — PX: CORONARY ARTERY BYPASS GRAFT: SHX141

## 2020-04-27 HISTORY — PX: TEE WITHOUT CARDIOVERSION: SHX5443

## 2020-04-27 LAB — URINALYSIS, ROUTINE W REFLEX MICROSCOPIC
Bilirubin Urine: NEGATIVE
Glucose, UA: NEGATIVE mg/dL
Hgb urine dipstick: NEGATIVE
Ketones, ur: NEGATIVE mg/dL
Leukocytes,Ua: NEGATIVE
Nitrite: NEGATIVE
Protein, ur: NEGATIVE mg/dL
Specific Gravity, Urine: 1.02 (ref 1.005–1.030)
pH: 5.5 (ref 5.0–8.0)

## 2020-04-27 LAB — POCT I-STAT, CHEM 8
BUN: 12 mg/dL (ref 8–23)
BUN: 11 mg/dL (ref 8–23)
BUN: 11 mg/dL (ref 8–23)
BUN: 12 mg/dL (ref 8–23)
BUN: 12 mg/dL (ref 8–23)
Calcium, Ion: 1.51 mmol/L (ref 1.15–1.40)
Calcium, Ion: 1.28 mmol/L (ref 1.15–1.40)
Calcium, Ion: 1.41 mmol/L — ABNORMAL HIGH (ref 1.15–1.40)
Calcium, Ion: 1.15 mmol/L (ref 1.15–1.40)
Calcium, Ion: 1.19 mmol/L (ref 1.15–1.40)
Chloride: 108 mmol/L (ref 98–111)
Chloride: 106 mmol/L (ref 98–111)
Chloride: 108 mmol/L (ref 98–111)
Chloride: 105 mmol/L (ref 98–111)
Chloride: 107 mmol/L (ref 98–111)
Creatinine, Ser: 0.7 mg/dL (ref 0.44–1.00)
Creatinine, Ser: 0.6 mg/dL (ref 0.44–1.00)
Creatinine, Ser: 0.6 mg/dL (ref 0.44–1.00)
Creatinine, Ser: 0.6 mg/dL (ref 0.44–1.00)
Creatinine, Ser: 0.6 mg/dL (ref 0.44–1.00)
Glucose, Bld: 112 mg/dL — ABNORMAL HIGH (ref 70–99)
Glucose, Bld: 118 mg/dL — ABNORMAL HIGH (ref 70–99)
Glucose, Bld: 128 mg/dL — ABNORMAL HIGH (ref 70–99)
Glucose, Bld: 138 mg/dL — ABNORMAL HIGH (ref 70–99)
Glucose, Bld: 140 mg/dL — ABNORMAL HIGH (ref 70–99)
HCT: 29 % — ABNORMAL LOW (ref 36.0–46.0)
HCT: 22 % — ABNORMAL LOW (ref 36.0–46.0)
HCT: 25 % — ABNORMAL LOW (ref 36.0–46.0)
HCT: 23 % — ABNORMAL LOW (ref 36.0–46.0)
HCT: 25 % — ABNORMAL LOW (ref 36.0–46.0)
Hemoglobin: 9.9 g/dL — ABNORMAL LOW (ref 12.0–15.0)
Hemoglobin: 7.5 g/dL — ABNORMAL LOW (ref 12.0–15.0)
Hemoglobin: 8.5 g/dL — ABNORMAL LOW (ref 12.0–15.0)
Hemoglobin: 7.8 g/dL — ABNORMAL LOW (ref 12.0–15.0)
Hemoglobin: 8.5 g/dL — ABNORMAL LOW (ref 12.0–15.0)
Potassium: 3.6 mmol/L (ref 3.5–5.1)
Potassium: 3.6 mmol/L (ref 3.5–5.1)
Potassium: 4.5 mmol/L (ref 3.5–5.1)
Potassium: 4.1 mmol/L (ref 3.5–5.1)
Potassium: 4.8 mmol/L (ref 3.5–5.1)
Sodium: 144 mmol/L (ref 135–145)
Sodium: 142 mmol/L (ref 135–145)
Sodium: 142 mmol/L (ref 135–145)
Sodium: 141 mmol/L (ref 135–145)
Sodium: 141 mmol/L (ref 135–145)
TCO2: 26 mmol/L (ref 22–32)
TCO2: 22 mmol/L (ref 22–32)
TCO2: 25 mmol/L (ref 22–32)
TCO2: 25 mmol/L (ref 22–32)
TCO2: 26 mmol/L (ref 22–32)

## 2020-04-27 LAB — POCT I-STAT 7, (LYTES, BLD GAS, ICA,H+H)
Acid-Base Excess: 0 mmol/L (ref 0.0–2.0)
Acid-Base Excess: 1 mmol/L (ref 0.0–2.0)
Acid-Base Excess: 2 mmol/L (ref 0.0–2.0)
Acid-Base Excess: 1 mmol/L (ref 0.0–2.0)
Acid-base deficit: 4 mmol/L — ABNORMAL HIGH (ref 0.0–2.0)
Acid-base deficit: 5 mmol/L — ABNORMAL HIGH (ref 0.0–2.0)
Acid-base deficit: 2 mmol/L (ref 0.0–2.0)
Acid-base deficit: 1 mmol/L (ref 0.0–2.0)
Acid-base deficit: 1 mmol/L (ref 0.0–2.0)
Acid-base deficit: 2 mmol/L (ref 0.0–2.0)
Bicarbonate: 25.8 mmol/L (ref 20.0–28.0)
Bicarbonate: 27 mmol/L (ref 20.0–28.0)
Bicarbonate: 27.2 mmol/L (ref 20.0–28.0)
Bicarbonate: 24 mmol/L (ref 20.0–28.0)
Bicarbonate: 23.2 mmol/L (ref 20.0–28.0)
Bicarbonate: 28.2 mmol/L — ABNORMAL HIGH (ref 20.0–28.0)
Bicarbonate: 24.8 mmol/L (ref 20.0–28.0)
Bicarbonate: 24.2 mmol/L (ref 20.0–28.0)
Bicarbonate: 23.7 mmol/L (ref 20.0–28.0)
Bicarbonate: 23.3 mmol/L (ref 20.0–28.0)
Calcium, Ion: 1.5 mmol/L — ABNORMAL HIGH (ref 1.15–1.40)
Calcium, Ion: 1.12 mmol/L — ABNORMAL LOW (ref 1.15–1.40)
Calcium, Ion: 1.21 mmol/L (ref 1.15–1.40)
Calcium, Ion: 1.3 mmol/L (ref 1.15–1.40)
Calcium, Ion: 1.39 mmol/L (ref 1.15–1.40)
Calcium, Ion: 1.26 mmol/L (ref 1.15–1.40)
Calcium, Ion: 1.26 mmol/L (ref 1.15–1.40)
Calcium, Ion: 1.26 mmol/L (ref 1.15–1.40)
Calcium, Ion: 1.3 mmol/L (ref 1.15–1.40)
Calcium, Ion: 1.28 mmol/L (ref 1.15–1.40)
HCT: 35 % — ABNORMAL LOW (ref 36.0–46.0)
HCT: 20 % — ABNORMAL LOW (ref 36.0–46.0)
HCT: 24 % — ABNORMAL LOW (ref 36.0–46.0)
HCT: 27 % — ABNORMAL LOW (ref 36.0–46.0)
HCT: 24 % — ABNORMAL LOW (ref 36.0–46.0)
HCT: 22 % — ABNORMAL LOW (ref 36.0–46.0)
HCT: 24 % — ABNORMAL LOW (ref 36.0–46.0)
HCT: 24 % — ABNORMAL LOW (ref 36.0–46.0)
HCT: 24 % — ABNORMAL LOW (ref 36.0–46.0)
HCT: 24 % — ABNORMAL LOW (ref 36.0–46.0)
Hemoglobin: 11.9 g/dL — ABNORMAL LOW (ref 12.0–15.0)
Hemoglobin: 6.8 g/dL — CL (ref 12.0–15.0)
Hemoglobin: 8.2 g/dL — ABNORMAL LOW (ref 12.0–15.0)
Hemoglobin: 9.2 g/dL — ABNORMAL LOW (ref 12.0–15.0)
Hemoglobin: 8.2 g/dL — ABNORMAL LOW (ref 12.0–15.0)
Hemoglobin: 7.5 g/dL — ABNORMAL LOW (ref 12.0–15.0)
Hemoglobin: 8.2 g/dL — ABNORMAL LOW (ref 12.0–15.0)
Hemoglobin: 8.2 g/dL — ABNORMAL LOW (ref 12.0–15.0)
Hemoglobin: 8.2 g/dL — ABNORMAL LOW (ref 12.0–15.0)
Hemoglobin: 8.2 g/dL — ABNORMAL LOW (ref 12.0–15.0)
O2 Saturation: 100 %
O2 Saturation: 100 %
O2 Saturation: 100 %
O2 Saturation: 94 %
O2 Saturation: 96 %
O2 Saturation: 97 %
O2 Saturation: 98 %
O2 Saturation: 96 %
O2 Saturation: 96 %
O2 Saturation: 97 %
Patient temperature: 34.9
Patient temperature: 35.3
Patient temperature: 36
Patient temperature: 36.3
Patient temperature: 36.7
Patient temperature: 37.5
Patient temperature: 37.6
Potassium: 3.6 mmol/L (ref 3.5–5.1)
Potassium: 3.8 mmol/L (ref 3.5–5.1)
Potassium: 4.2 mmol/L (ref 3.5–5.1)
Potassium: 3.9 mmol/L (ref 3.5–5.1)
Potassium: 4 mmol/L (ref 3.5–5.1)
Potassium: 4.2 mmol/L (ref 3.5–5.1)
Potassium: 4.8 mmol/L (ref 3.5–5.1)
Potassium: 4.4 mmol/L (ref 3.5–5.1)
Potassium: 3.9 mmol/L (ref 3.5–5.1)
Potassium: 3.9 mmol/L (ref 3.5–5.1)
Sodium: 143 mmol/L (ref 135–145)
Sodium: 143 mmol/L (ref 135–145)
Sodium: 142 mmol/L (ref 135–145)
Sodium: 144 mmol/L (ref 135–145)
Sodium: 144 mmol/L (ref 135–145)
Sodium: 147 mmol/L — ABNORMAL HIGH (ref 135–145)
Sodium: 146 mmol/L — ABNORMAL HIGH (ref 135–145)
Sodium: 145 mmol/L (ref 135–145)
Sodium: 143 mmol/L (ref 135–145)
Sodium: 144 mmol/L (ref 135–145)
TCO2: 27 mmol/L (ref 22–32)
TCO2: 28 mmol/L (ref 22–32)
TCO2: 28 mmol/L (ref 22–32)
TCO2: 26 mmol/L (ref 22–32)
TCO2: 25 mmol/L (ref 22–32)
TCO2: 30 mmol/L (ref 22–32)
TCO2: 26 mmol/L (ref 22–32)
TCO2: 25 mmol/L (ref 22–32)
TCO2: 25 mmol/L (ref 22–32)
TCO2: 25 mmol/L (ref 22–32)
pCO2 arterial: 45.6 mmHg (ref 32.0–48.0)
pCO2 arterial: 47.7 mmHg (ref 32.0–48.0)
pCO2 arterial: 42.1 mmHg (ref 32.0–48.0)
pCO2 arterial: 51.6 mmHg — ABNORMAL HIGH (ref 32.0–48.0)
pCO2 arterial: 53.4 mmHg — ABNORMAL HIGH (ref 32.0–48.0)
pCO2 arterial: 55.3 mmHg — ABNORMAL HIGH (ref 32.0–48.0)
pCO2 arterial: 49.2 mmHg — ABNORMAL HIGH (ref 32.0–48.0)
pCO2 arterial: 42.7 mmHg (ref 32.0–48.0)
pCO2 arterial: 40.5 mmHg (ref 32.0–48.0)
pCO2 arterial: 43.3 mmHg (ref 32.0–48.0)
pH, Arterial: 7.361 (ref 7.350–7.450)
pH, Arterial: 7.361 (ref 7.350–7.450)
pH, Arterial: 7.418 (ref 7.350–7.450)
pH, Arterial: 7.265 — ABNORMAL LOW (ref 7.350–7.450)
pH, Arterial: 7.237 — ABNORMAL LOW (ref 7.350–7.450)
pH, Arterial: 7.31 — ABNORMAL LOW (ref 7.350–7.450)
pH, Arterial: 7.308 — ABNORMAL LOW (ref 7.350–7.450)
pH, Arterial: 7.359 (ref 7.350–7.450)
pH, Arterial: 7.378 (ref 7.350–7.450)
pH, Arterial: 7.341 — ABNORMAL LOW (ref 7.350–7.450)
pO2, Arterial: 483 mmHg — ABNORMAL HIGH (ref 83.0–108.0)
pO2, Arterial: 465 mmHg — ABNORMAL HIGH (ref 83.0–108.0)
pO2, Arterial: 450 mmHg — ABNORMAL HIGH (ref 83.0–108.0)
pO2, Arterial: 73 mmHg — ABNORMAL LOW (ref 83.0–108.0)
pO2, Arterial: 90 mmHg (ref 83.0–108.0)
pO2, Arterial: 96 mmHg (ref 83.0–108.0)
pO2, Arterial: 116 mmHg — ABNORMAL HIGH (ref 83.0–108.0)
pO2, Arterial: 86 mmHg (ref 83.0–108.0)
pO2, Arterial: 85 mmHg (ref 83.0–108.0)
pO2, Arterial: 100 mmHg (ref 83.0–108.0)

## 2020-04-27 LAB — GLUCOSE, CAPILLARY
Glucose-Capillary: 127 mg/dL — ABNORMAL HIGH (ref 70–99)
Glucose-Capillary: 92 mg/dL (ref 70–99)
Glucose-Capillary: 124 mg/dL — ABNORMAL HIGH (ref 70–99)
Glucose-Capillary: 131 mg/dL — ABNORMAL HIGH (ref 70–99)
Glucose-Capillary: 133 mg/dL — ABNORMAL HIGH (ref 70–99)
Glucose-Capillary: 178 mg/dL — ABNORMAL HIGH (ref 70–99)
Glucose-Capillary: 160 mg/dL — ABNORMAL HIGH (ref 70–99)
Glucose-Capillary: 130 mg/dL — ABNORMAL HIGH (ref 70–99)

## 2020-04-27 LAB — BASIC METABOLIC PANEL
Anion gap: 10 (ref 5–15)
Anion gap: 8 (ref 5–15)
BUN: 11 mg/dL (ref 8–23)
BUN: 11 mg/dL (ref 8–23)
CO2: 23 mmol/L (ref 22–32)
CO2: 23 mmol/L (ref 22–32)
Calcium: 10.6 mg/dL — ABNORMAL HIGH (ref 8.9–10.3)
Calcium: 8.3 mg/dL — ABNORMAL LOW (ref 8.9–10.3)
Chloride: 107 mmol/L (ref 98–111)
Chloride: 111 mmol/L (ref 98–111)
Creatinine, Ser: 0.86 mg/dL (ref 0.44–1.00)
Creatinine, Ser: 0.76 mg/dL (ref 0.44–1.00)
GFR, Estimated: >60 mL/min (ref >60)
GFR, Estimated: >60 mL/min (ref >60)
Glucose, Bld: 104 mg/dL — ABNORMAL HIGH (ref 70–99)
Glucose, Bld: 126 mg/dL — ABNORMAL HIGH (ref 70–99)
Potassium: 3.6 mmol/L (ref 3.5–5.1)
Potassium: 4.4 mmol/L (ref 3.5–5.1)
Sodium: 140 mmol/L (ref 135–145)
Sodium: 142 mmol/L (ref 135–145)

## 2020-04-27 LAB — CBC
HCT: 35.6 % — ABNORMAL LOW (ref 36.0–46.0)
HCT: 28.3 % — ABNORMAL LOW (ref 36.0–46.0)
HCT: 26.3 % — ABNORMAL LOW (ref 36.0–46.0)
Hemoglobin: 11.9 g/dL — ABNORMAL LOW (ref 12.0–15.0)
Hemoglobin: 9.1 g/dL — ABNORMAL LOW (ref 12.0–15.0)
Hemoglobin: 8.9 g/dL — ABNORMAL LOW (ref 12.0–15.0)
MCH: 30.5 pg (ref 26.0–34.0)
MCH: 29.8 pg (ref 26.0–34.0)
MCH: 30.7 pg (ref 26.0–34.0)
MCHC: 33.4 g/dL (ref 30.0–36.0)
MCHC: 32.2 g/dL (ref 30.0–36.0)
MCHC: 33.8 g/dL (ref 30.0–36.0)
MCV: 91.3 fL (ref 80.0–100.0)
MCV: 92.8 fL (ref 80.0–100.0)
MCV: 90.7 fL (ref 80.0–100.0)
Platelets: 280 10*3/uL (ref 150–400)
Platelets: 151 10*3/uL (ref 150–400)
Platelets: 141 10*3/uL — ABNORMAL LOW (ref 150–400)
RBC: 3.9 MIL/uL (ref 3.87–5.11)
RBC: 3.05 MIL/uL — ABNORMAL LOW (ref 3.87–5.11)
RBC: 2.9 MIL/uL — ABNORMAL LOW (ref 3.87–5.11)
RDW: 13 % (ref 11.5–15.5)
RDW: 13.5 % (ref 11.5–15.5)
RDW: 14.1 % (ref 11.5–15.5)
WBC: 9.1 10*3/uL (ref 4.0–10.5)
WBC: 16 10*3/uL — ABNORMAL HIGH (ref 4.0–10.5)
WBC: 12.4 10*3/uL — ABNORMAL HIGH (ref 4.0–10.5)
nRBC: 0 % (ref 0.0–0.2)
nRBC: 0 % (ref 0.0–0.2)
nRBC: 0 % (ref 0.0–0.2)

## 2020-04-27 LAB — POCT I-STAT EG7
Acid-base deficit: 1 mmol/L (ref 0.0–2.0)
Bicarbonate: 24.4 mmol/L (ref 20.0–28.0)
Calcium, Ion: 1.2 mmol/L (ref 1.15–1.40)
HCT: 20 % — ABNORMAL LOW (ref 36.0–46.0)
Hemoglobin: 6.8 g/dL — CL (ref 12.0–15.0)
O2 Saturation: 75 %
Potassium: 5.2 mmol/L — ABNORMAL HIGH (ref 3.5–5.1)
Sodium: 143 mmol/L (ref 135–145)
TCO2: 26 mmol/L (ref 22–32)
pCO2, Ven: 46.2 mmHg (ref 44.0–60.0)
pH, Ven: 7.332 (ref 7.250–7.430)
pO2, Ven: 43 mmHg (ref 32.0–45.0)

## 2020-04-27 LAB — HEMOGLOBIN AND HEMATOCRIT, BLOOD
HCT: 23.1 % — ABNORMAL LOW (ref 36.0–46.0)
Hemoglobin: 7.7 g/dL — ABNORMAL LOW (ref 12.0–15.0)

## 2020-04-27 LAB — LIPID PANEL
Cholesterol: 160 mg/dL (ref 0–200)
HDL: 40 mg/dL — ABNORMAL LOW (ref >40)
LDL Cholesterol: 93 mg/dL (ref 0–99)
Total CHOL/HDL Ratio: 4 RATIO
Triglycerides: 136 mg/dL (ref <150)
VLDL: 27 mg/dL (ref 0–40)

## 2020-04-27 LAB — PROTIME-INR
INR: 1 (ref 0.8–1.2)
INR: 1.4 — ABNORMAL HIGH (ref 0.8–1.2)
Prothrombin Time: 13.1 seconds (ref 11.4–15.2)
Prothrombin Time: 16.6 seconds — ABNORMAL HIGH (ref 11.4–15.2)

## 2020-04-27 LAB — PLATELET COUNT: Platelets: 160 10*3/uL (ref 150–400)

## 2020-04-27 LAB — ECHO INTRAOPERATIVE TEE
Height: 61 in
Weight: 2458.57 oz

## 2020-04-27 LAB — HEMOGLOBIN A1C
Hgb A1c MFr Bld: 6.3 % — ABNORMAL HIGH (ref 4.8–5.6)
Mean Plasma Glucose: 134.11 mg/dL

## 2020-04-27 LAB — APTT
aPTT: 25 seconds (ref 24–36)
aPTT: 32 seconds (ref 24–36)

## 2020-04-27 LAB — SURGICAL PCR SCREEN
MRSA, PCR: NEGATIVE
Staphylococcus aureus: NEGATIVE

## 2020-04-27 LAB — MAGNESIUM: Magnesium: 3.2 mg/dL — ABNORMAL HIGH (ref 1.7–2.4)

## 2020-04-27 SURGERY — CORONARY ARTERY BYPASS GRAFTING (CABG)
Anesthesia: General | Site: Chest

## 2020-04-27 MED ORDER — ORAL CARE MOUTH RINSE
15.0000 mL | Freq: Two times a day (BID) | OROMUCOSAL | Status: DC
  Administered 2020-04-27 – 2020-05-04 (×9): 15 mL via OROMUCOSAL

## 2020-04-27 MED ORDER — SODIUM CHLORIDE 0.9% FLUSH
10.0000 mL | Freq: Two times a day (BID) | INTRAVENOUS | Status: DC
  Administered 2020-04-27 – 2020-05-03 (×5): 10 mL

## 2020-04-27 MED ORDER — SODIUM BICARBONATE 8.4 % IV SOLN
100.0000 meq | Freq: Once | INTRAVENOUS | Status: AC
  Administered 2020-04-27: 100 meq via INTRAVENOUS

## 2020-04-27 MED ORDER — FAMOTIDINE IN NACL 20-0.9 MG/50ML-% IV SOLN
20.0000 mg | Freq: Two times a day (BID) | INTRAVENOUS | Status: AC
  Administered 2020-04-27 (×2): 20 mg via INTRAVENOUS
  Filled 2020-04-27 (×2): qty 50

## 2020-04-27 MED ORDER — CHLORHEXIDINE GLUCONATE CLOTH 2 % EX PADS
6.0000 | MEDICATED_PAD | Freq: Every day | CUTANEOUS | Status: DC
  Administered 2020-04-27 – 2020-05-03 (×7): 6 via TOPICAL

## 2020-04-27 MED ORDER — MORPHINE SULFATE (PF) 2 MG/ML IV SOLN
1.0000 mg | INTRAVENOUS | Status: DC | PRN
  Administered 2020-04-27: 1 mg via INTRAVENOUS
  Administered 2020-04-27 (×2): 4 mg via INTRAVENOUS
  Administered 2020-04-27: 1 mg via INTRAVENOUS
  Administered 2020-04-28 (×2): 4 mg via INTRAVENOUS
  Filled 2020-04-27 (×2): qty 2
  Filled 2020-04-27: qty 1
  Filled 2020-04-27: qty 2
  Filled 2020-04-27: qty 1
  Filled 2020-04-27: qty 2

## 2020-04-27 MED ORDER — LEVALBUTEROL TARTRATE 45 MCG/ACT IN AERO: 2.0000 | INHALATION_SPRAY | Freq: Four times a day (QID) | RESPIRATORY_TRACT | Status: DC

## 2020-04-27 MED ORDER — BUPIVACAINE HCL 0.5 % IJ SOLN
INTRAMUSCULAR | Status: DC | PRN
  Administered 2020-04-27: 30 mL

## 2020-04-27 MED ORDER — ACETAMINOPHEN 500 MG PO TABS
1000.0000 mg | ORAL_TABLET | Freq: Four times a day (QID) | ORAL | Status: AC
  Administered 2020-04-27 – 2020-04-29 (×7): 1000 mg via ORAL
  Filled 2020-04-27 (×8): qty 2

## 2020-04-27 MED ORDER — METOPROLOL TARTRATE 25 MG/10 ML ORAL SUSPENSION
12.5000 mg | Freq: Two times a day (BID) | ORAL | Status: DC
  Filled 2020-04-27 (×2): qty 5

## 2020-04-27 MED ORDER — FENTANYL CITRATE (PF) 250 MCG/5ML IJ SOLN
INTRAMUSCULAR | Status: AC
  Filled 2020-04-27: qty 30

## 2020-04-27 MED ORDER — POTASSIUM CHLORIDE 10 MEQ/50ML IV SOLN
10.0000 meq | INTRAVENOUS | Status: AC
  Administered 2020-04-27 (×3): 10 meq via INTRAVENOUS

## 2020-04-27 MED ORDER — DEXTROSE 50 % IV SOLN: 0.0000 mL | INTRAVENOUS | Status: DC | PRN

## 2020-04-27 MED ORDER — SODIUM CHLORIDE 0.9 % IV SOLN: INTRAVENOUS | Status: DC | PRN

## 2020-04-27 MED ORDER — TRAMADOL HCL 50 MG PO TABS
50.0000 mg | ORAL_TABLET | ORAL | Status: DC | PRN
  Administered 2020-04-27 – 2020-04-29 (×4): 100 mg via ORAL
  Filled 2020-04-27 (×4): qty 2

## 2020-04-27 MED ORDER — INSULIN REGULAR(HUMAN) IN NACL 100-0.9 UT/100ML-% IV SOLN: INTRAVENOUS | Status: DC

## 2020-04-27 MED ORDER — PROPOFOL 10 MG/ML IV BOLUS
INTRAVENOUS | Status: AC
  Filled 2020-04-27: qty 20

## 2020-04-27 MED ORDER — ROCURONIUM BROMIDE 10 MG/ML (PF) SYRINGE
PREFILLED_SYRINGE | INTRAVENOUS | Status: AC
  Filled 2020-04-27: qty 10

## 2020-04-27 MED ORDER — LACTATED RINGERS IV SOLN
500.0000 mL | Freq: Once | INTRAVENOUS | Status: AC | PRN
  Administered 2020-04-27: 500 mL via INTRAVENOUS

## 2020-04-27 MED ORDER — ACETAMINOPHEN 160 MG/5ML PO SOLN: 650.0000 mg | Freq: Once | ORAL | Status: AC

## 2020-04-27 MED ORDER — ASPIRIN EC 325 MG PO TBEC
325.0000 mg | DELAYED_RELEASE_TABLET | Freq: Every day | ORAL | Status: DC
  Administered 2020-04-28 – 2020-05-01 (×3): 325 mg via ORAL
  Filled 2020-04-27 (×4): qty 1

## 2020-04-27 MED ORDER — INFLUENZA VAC A&B SA ADJ QUAD 0.5 ML IM PRSY
0.5000 mL | PREFILLED_SYRINGE | INTRAMUSCULAR | Status: DC
  Filled 2020-04-27: qty 0.5

## 2020-04-27 MED ORDER — LACTATED RINGERS IV SOLN: INTRAVENOUS | Status: DC

## 2020-04-27 MED ORDER — SODIUM CHLORIDE 0.9% FLUSH
3.0000 mL | Freq: Two times a day (BID) | INTRAVENOUS | Status: DC
  Administered 2020-04-28 – 2020-04-29 (×3): 3 mL via INTRAVENOUS

## 2020-04-27 MED ORDER — ALBUMIN HUMAN 5 % IV SOLN: INTRAVENOUS | Status: DC | PRN

## 2020-04-27 MED ORDER — PLATELET RICH PLASMA OPTIME
Status: DC | PRN
  Administered 2020-04-27: 10 mL

## 2020-04-27 MED ORDER — SODIUM CHLORIDE 0.9 % IV SOLN
1.5000 g | Freq: Two times a day (BID) | INTRAVENOUS | Status: AC
  Administered 2020-04-27 – 2020-04-29 (×4): 1.5 g via INTRAVENOUS
  Filled 2020-04-27 (×4): qty 1.5

## 2020-04-27 MED ORDER — PLASMA-LYTE 148 IV SOLN
INTRAVENOUS | Status: DC | PRN
  Administered 2020-04-27: 500 mL via INTRAVASCULAR

## 2020-04-27 MED ORDER — PNEUMOCOCCAL VAC POLYVALENT 25 MCG/0.5ML IJ INJ
0.5000 mL | INJECTION | INTRAMUSCULAR | Status: AC
  Administered 2020-04-29: 0.5 mL via INTRAMUSCULAR
  Filled 2020-04-27: qty 0.5

## 2020-04-27 MED ORDER — PHENYLEPHRINE 40 MCG/ML (10ML) SYRINGE FOR IV PUSH (FOR BLOOD PRESSURE SUPPORT)
PREFILLED_SYRINGE | INTRAVENOUS | Status: AC
  Filled 2020-04-27: qty 10

## 2020-04-27 MED ORDER — MAGNESIUM SULFATE 4 GM/100ML IV SOLN
4.0000 g | Freq: Once | INTRAVENOUS | Status: AC
  Administered 2020-04-27: 4 g via INTRAVENOUS
  Filled 2020-04-27: qty 100

## 2020-04-27 MED ORDER — BUPIVACAINE LIPOSOME 1.3 % IJ SUSP
20.0000 mL | Freq: Once | INTRAMUSCULAR | Status: AC
  Administered 2020-04-27: 20 mL
  Filled 2020-04-27: qty 20

## 2020-04-27 MED ORDER — ARTIFICIAL TEARS OPHTHALMIC OINT
TOPICAL_OINTMENT | OPHTHALMIC | Status: AC
  Filled 2020-04-27: qty 3.5

## 2020-04-27 MED ORDER — SODIUM CHLORIDE 0.45 % IV SOLN: INTRAVENOUS | Status: DC | PRN

## 2020-04-27 MED ORDER — MIDAZOLAM HCL (PF) 10 MG/2ML IJ SOLN
INTRAMUSCULAR | Status: AC
  Filled 2020-04-27: qty 2

## 2020-04-27 MED ORDER — CHLORHEXIDINE GLUCONATE 0.12% ORAL RINSE (MEDLINE KIT)
15.0000 mL | Freq: Two times a day (BID) | OROMUCOSAL | Status: DC
  Administered 2020-04-27: 15 mL via OROMUCOSAL

## 2020-04-27 MED ORDER — 0.9 % SODIUM CHLORIDE (POUR BTL) OPTIME
TOPICAL | Status: DC | PRN
  Administered 2020-04-27: 5000 mL

## 2020-04-27 MED ORDER — HEMOSTATIC AGENTS (NO CHARGE) OPTIME
TOPICAL | Status: DC | PRN
  Administered 2020-04-27 (×2): 1 via TOPICAL

## 2020-04-27 MED ORDER — CHLORHEXIDINE GLUCONATE 0.12 % MT SOLN
15.0000 mL | OROMUCOSAL | Status: AC
  Administered 2020-04-27: 15 mL via OROMUCOSAL

## 2020-04-27 MED ORDER — OXYCODONE HCL 5 MG PO TABS
5.0000 mg | ORAL_TABLET | ORAL | Status: DC | PRN
  Administered 2020-04-27 – 2020-04-28 (×3): 10 mg via ORAL
  Administered 2020-04-28 (×2): 5 mg via ORAL
  Administered 2020-04-28 – 2020-05-04 (×29): 10 mg via ORAL
  Filled 2020-04-27 (×17): qty 2
  Filled 2020-04-27: qty 1
  Filled 2020-04-27 (×4): qty 2
  Filled 2020-04-27: qty 1
  Filled 2020-04-27 (×13): qty 2
  Filled 2020-04-27: qty 1

## 2020-04-27 MED ORDER — ROCURONIUM BROMIDE 10 MG/ML (PF) SYRINGE
PREFILLED_SYRINGE | INTRAVENOUS | Status: DC | PRN
  Administered 2020-04-27 (×2): 100 mg via INTRAVENOUS

## 2020-04-27 MED ORDER — PLATELET POOR PLASMA OPTIME
Status: DC | PRN
  Administered 2020-04-27: 10 mL

## 2020-04-27 MED ORDER — SODIUM CHLORIDE (PF) 0.9 % IJ SOLN
INTRAMUSCULAR | Status: AC
  Filled 2020-04-27: qty 20

## 2020-04-27 MED ORDER — ARTIFICIAL TEARS OPHTHALMIC OINT
TOPICAL_OINTMENT | OPHTHALMIC | Status: DC | PRN
  Administered 2020-04-27: 1 via OPHTHALMIC

## 2020-04-27 MED ORDER — PLASMA-LYTE A IV SOLN: INTRAVENOUS | Status: DC

## 2020-04-27 MED ORDER — METOPROLOL TARTRATE 5 MG/5ML IV SOLN
2.5000 mg | INTRAVENOUS | Status: DC | PRN
  Administered 2020-04-27 – 2020-04-30 (×2): 5 mg via INTRAVENOUS
  Administered 2020-04-30 – 2020-05-03 (×2): 2.5 mg via INTRAVENOUS
  Filled 2020-04-27 (×6): qty 5

## 2020-04-27 MED ORDER — VANCOMYCIN HCL IN DEXTROSE 1-5 GM/200ML-% IV SOLN
1000.0000 mg | Freq: Once | INTRAVENOUS | Status: AC
  Administered 2020-04-27: 1000 mg via INTRAVENOUS
  Filled 2020-04-27: qty 200

## 2020-04-27 MED ORDER — SODIUM CHLORIDE 0.9 % IV SOLN: 250.0000 mL | INTRAVENOUS | Status: DC

## 2020-04-27 MED ORDER — ONDANSETRON HCL 4 MG/2ML IJ SOLN: 4.0000 mg | Freq: Four times a day (QID) | INTRAMUSCULAR | Status: DC | PRN

## 2020-04-27 MED ORDER — BISACODYL 5 MG PO TBEC
10.0000 mg | DELAYED_RELEASE_TABLET | Freq: Every day | ORAL | Status: DC
  Administered 2020-04-28 – 2020-04-29 (×2): 10 mg via ORAL
  Filled 2020-04-27 (×4): qty 2

## 2020-04-27 MED ORDER — VANCOMYCIN HCL 1000 MG IV SOLR
INTRAVENOUS | Status: DC | PRN
  Administered 2020-04-27: 3000 mg

## 2020-04-27 MED ORDER — PROPOFOL 10 MG/ML IV BOLUS
INTRAVENOUS | Status: DC | PRN
  Administered 2020-04-27: 60 mg via INTRAVENOUS

## 2020-04-27 MED ORDER — METOPROLOL TARTRATE 12.5 MG HALF TABLET
12.5000 mg | ORAL_TABLET | Freq: Two times a day (BID) | ORAL | Status: DC
  Administered 2020-04-29 (×2): 12.5 mg via ORAL
  Filled 2020-04-27 (×2): qty 1

## 2020-04-27 MED ORDER — SODIUM CHLORIDE 0.9% FLUSH: 3.0000 mL | INTRAVENOUS | Status: DC | PRN

## 2020-04-27 MED ORDER — SODIUM CHLORIDE 0.9 % IV SOLN: INTRAVENOUS | Status: DC

## 2020-04-27 MED ORDER — EPHEDRINE 5 MG/ML INJ
INTRAVENOUS | Status: AC
  Filled 2020-04-27: qty 10

## 2020-04-27 MED ORDER — ACETAMINOPHEN 160 MG/5ML PO SOLN: 1000.0000 mg | Freq: Four times a day (QID) | ORAL | Status: AC

## 2020-04-27 MED ORDER — ORAL CARE MOUTH RINSE
15.0000 mL | OROMUCOSAL | Status: DC
  Administered 2020-04-27 (×2): 15 mL via OROMUCOSAL

## 2020-04-27 MED ORDER — ASPIRIN 81 MG PO CHEW
324.0000 mg | CHEWABLE_TABLET | Freq: Every day | ORAL | Status: DC
  Administered 2020-04-29: 324 mg
  Filled 2020-04-27: qty 4

## 2020-04-27 MED ORDER — BISACODYL 10 MG RE SUPP: 10.0000 mg | Freq: Every day | RECTAL | Status: DC

## 2020-04-27 MED ORDER — STERILE WATER FOR INJECTION IJ SOLN
INTRAMUSCULAR | Status: DC | PRN
  Administered 2020-04-27: 10 mL

## 2020-04-27 MED ORDER — ACETAMINOPHEN 650 MG RE SUPP
650.0000 mg | Freq: Once | RECTAL | Status: AC
  Administered 2020-04-27: 650 mg via RECTAL

## 2020-04-27 MED ORDER — DEXMEDETOMIDINE HCL IN NACL 400 MCG/100ML IV SOLN
0.0000 ug/kg/h | INTRAVENOUS | Status: DC
  Administered 2020-04-27: 0.7 ug/kg/h via INTRAVENOUS
  Filled 2020-04-27: qty 100

## 2020-04-27 MED ORDER — SODIUM CHLORIDE 0.9% FLUSH: 10.0000 mL | INTRAVENOUS | Status: DC | PRN

## 2020-04-27 MED ORDER — MIDAZOLAM HCL 5 MG/5ML IJ SOLN
INTRAMUSCULAR | Status: DC | PRN
  Administered 2020-04-27: 4 mg via INTRAVENOUS
  Administered 2020-04-27: 2 mg via INTRAVENOUS
  Administered 2020-04-27: 3 mg via INTRAVENOUS
  Administered 2020-04-27: 1 mg via INTRAVENOUS

## 2020-04-27 MED ORDER — BUPIVACAINE HCL (PF) 0.5 % IJ SOLN
INTRAMUSCULAR | Status: AC
  Filled 2020-04-27: qty 30

## 2020-04-27 MED ORDER — ALBUMIN HUMAN 5 % IV SOLN
250.0000 mL | INTRAVENOUS | Status: AC | PRN
  Administered 2020-04-27 (×4): 12.5 g via INTRAVENOUS
  Filled 2020-04-27 (×2): qty 250

## 2020-04-27 MED ORDER — LACTATED RINGERS IV SOLN: INTRAVENOUS | Status: DC | PRN

## 2020-04-27 MED ORDER — NITROGLYCERIN IN D5W 200-5 MCG/ML-% IV SOLN: 0.0000 ug/min | INTRAVENOUS | Status: DC

## 2020-04-27 MED ORDER — VANCOMYCIN HCL 1000 MG IV SOLR
INTRAVENOUS | Status: AC
  Filled 2020-04-27: qty 3000

## 2020-04-27 MED ORDER — FENTANYL CITRATE (PF) 250 MCG/5ML IJ SOLN
INTRAMUSCULAR | Status: DC | PRN
  Administered 2020-04-27: 150 ug via INTRAVENOUS
  Administered 2020-04-27: 50 ug via INTRAVENOUS
  Administered 2020-04-27: 150 ug via INTRAVENOUS
  Administered 2020-04-27: 100 ug via INTRAVENOUS
  Administered 2020-04-27: 50 ug via INTRAVENOUS
  Administered 2020-04-27 (×2): 100 ug via INTRAVENOUS
  Administered 2020-04-27 (×2): 50 ug via INTRAVENOUS
  Administered 2020-04-27 (×2): 100 ug via INTRAVENOUS
  Administered 2020-04-27: 250 ug via INTRAVENOUS
  Administered 2020-04-27: 150 ug via INTRAVENOUS
  Administered 2020-04-27: 100 ug via INTRAVENOUS

## 2020-04-27 MED ORDER — STERILE WATER FOR INJECTION IJ SOLN
INTRAMUSCULAR | Status: AC
  Filled 2020-04-27: qty 10

## 2020-04-27 MED ORDER — PHENYLEPHRINE HCL-NACL 20-0.9 MG/250ML-% IV SOLN
0.0000 ug/min | INTRAVENOUS | Status: DC
  Administered 2020-04-28: 22 ug/min via INTRAVENOUS
  Filled 2020-04-27 (×2): qty 250

## 2020-04-27 MED ORDER — METOCLOPRAMIDE HCL 5 MG/ML IJ SOLN
10.0000 mg | Freq: Four times a day (QID) | INTRAMUSCULAR | Status: AC
  Administered 2020-04-27 – 2020-04-28 (×4): 10 mg via INTRAVENOUS
  Filled 2020-04-27 (×4): qty 2

## 2020-04-27 MED ORDER — PROTAMINE SULFATE 10 MG/ML IV SOLN
INTRAVENOUS | Status: DC | PRN
  Administered 2020-04-27: 250 mg via INTRAVENOUS

## 2020-04-27 MED ORDER — PLASMA-LYTE 148 IV SOLN: INTRAVENOUS | Status: DC

## 2020-04-27 MED ORDER — LEVALBUTEROL HCL 0.63 MG/3ML IN NEBU
0.6300 mg | INHALATION_SOLUTION | Freq: Four times a day (QID) | RESPIRATORY_TRACT | Status: DC
  Administered 2020-04-27 – 2020-04-29 (×7): 0.63 mg via RESPIRATORY_TRACT
  Filled 2020-04-27 (×8): qty 3

## 2020-04-27 MED ORDER — HEPARIN SODIUM (PORCINE) 1000 UNIT/ML IJ SOLN
INTRAMUSCULAR | Status: DC | PRN
  Administered 2020-04-27: 25000 [IU] via INTRAVENOUS

## 2020-04-27 MED ORDER — THROMBIN 5000 UNITS EX SOLR
INTRAVENOUS | Status: DC | PRN
  Administered 2020-04-27: 2 mL

## 2020-04-27 MED ORDER — MIDAZOLAM HCL 2 MG/2ML IJ SOLN: 2.0000 mg | INTRAMUSCULAR | Status: DC | PRN

## 2020-04-27 MED ORDER — DOCUSATE SODIUM 100 MG PO CAPS
200.0000 mg | ORAL_CAPSULE | Freq: Every day | ORAL | Status: DC
  Administered 2020-04-28 – 2020-05-04 (×4): 200 mg via ORAL
  Filled 2020-04-27 (×6): qty 2

## 2020-04-27 MED ORDER — PANTOPRAZOLE SODIUM 40 MG PO TBEC
40.0000 mg | DELAYED_RELEASE_TABLET | Freq: Every day | ORAL | Status: DC
  Administered 2020-04-29 – 2020-05-04 (×6): 40 mg via ORAL
  Filled 2020-04-27 (×6): qty 1

## 2020-04-27 SURGICAL SUPPLY — 116 items
ADAPTER CARDIO PERF ANTE/RETRO (ADAPTER) ×4 IMPLANT
ADH SKN CLS APL DERMABOND .7 (GAUZE/BANDAGES/DRESSINGS)
ADPR PRFSN 84XANTGRD RTRGD (ADAPTER) ×3
APL SRG 7X2 LUM MLBL SLNT (VASCULAR PRODUCTS)
APPLICATOR TIP COSEAL (VASCULAR PRODUCTS) IMPLANT
APPLIER CLIP 9.375 SM OPEN (CLIP) ×4
APR CLP SM 9.3 20 MLT OPN (CLIP) ×3
BAG DECANTER FOR FLEXI CONT (MISCELLANEOUS) ×4 IMPLANT
BLADE CLIPPER SURG (BLADE) ×2 IMPLANT
BLADE STERNUM SYSTEM 6 (BLADE) ×4 IMPLANT
BLADE SURG 15 STRL LF DISP TIS (BLADE) ×3 IMPLANT
BLADE SURG 15 STRL SS (BLADE) ×4
BNDG ELASTIC 4X5.8 VLCR STR LF (GAUZE/BANDAGES/DRESSINGS) ×2 IMPLANT
BNDG ELASTIC 6X5.8 VLCR STR LF (GAUZE/BANDAGES/DRESSINGS) ×1 IMPLANT
BNDG GAUZE ELAST 4 BULKY (GAUZE/BANDAGES/DRESSINGS) ×2 IMPLANT
CANISTER SUCT 3000ML PPV (MISCELLANEOUS) ×4 IMPLANT
CANISTER WOUNDNEG PRESSURE 500 (CANNISTER) ×2 IMPLANT
CANNULA NON VENT 18FR 12 (CANNULA) ×2 IMPLANT
CATH CPB KIT HENDRICKSON (MISCELLANEOUS) ×4 IMPLANT
CATH ROBINSON RED A/P 18FR (CATHETERS) ×8 IMPLANT
CLIP APPLIE 9.375 SM OPEN (CLIP) ×3 IMPLANT
CLIP RETRACTION 3.0MM CORONARY (MISCELLANEOUS) ×4 IMPLANT
CLIP VESOCCLUDE MED 24/CT (CLIP) IMPLANT
CLIP VESOCCLUDE SM WIDE 24/CT (CLIP) IMPLANT
CONN ST 1/4X3/8  BEN (MISCELLANEOUS) ×4
CONN ST 1/4X3/8 BEN (MISCELLANEOUS) ×1 IMPLANT
COVER MAYO STAND STRL (DRAPES) ×4 IMPLANT
CUFF TOURN SGL QUICK 18X4 (TOURNIQUET CUFF) IMPLANT
CUFF TOURN SGL QUICK 24 (TOURNIQUET CUFF)
CUFF TRNQT CYL 24X4X16.5-23 (TOURNIQUET CUFF) IMPLANT
DERMABOND ADVANCED (GAUZE/BANDAGES/DRESSINGS)
DERMABOND ADVANCED .7 DNX12 (GAUZE/BANDAGES/DRESSINGS) ×2 IMPLANT
DRAIN CHANNEL 28F RND 3/8 FF (WOUND CARE) ×12 IMPLANT
DRAPE CARDIOVASCULAR INCISE (DRAPES) ×4
DRAPE EXTREMITY T 121X128X90 (DISPOSABLE) ×4 IMPLANT
DRAPE HALF SHEET 40X57 (DRAPES) ×4 IMPLANT
DRAPE SLUSH/WARMER DISC (DRAPES) ×4 IMPLANT
DRAPE SRG 135X102X78XABS (DRAPES) ×3 IMPLANT
DRESSING PEEL AND PLAC PRVNA20 (GAUZE/BANDAGES/DRESSINGS) ×1 IMPLANT
DRSG AQUACEL AG ADV 3.5X14 (GAUZE/BANDAGES/DRESSINGS) ×2 IMPLANT
DRSG COVADERM 4X14 (GAUZE/BANDAGES/DRESSINGS) ×2 IMPLANT
DRSG PEEL AND PLACE PREVENA 20 (GAUZE/BANDAGES/DRESSINGS) ×4
ELECT CAUTERY BLADE 6.4 (BLADE) ×4 IMPLANT
ELECT REM PT RETURN 9FT ADLT (ELECTROSURGICAL) ×8
ELECTRODE REM PT RTRN 9FT ADLT (ELECTROSURGICAL) ×6 IMPLANT
FELT TEFLON 1X6 (MISCELLANEOUS) ×5 IMPLANT
GAUZE SPONGE 4X4 12PLY STRL (GAUZE/BANDAGES/DRESSINGS) ×5 IMPLANT
GEL ULTRASOUND 20GR AQUASONIC (MISCELLANEOUS) ×4 IMPLANT
GLOVE ECLIPSE 6.0 STRL STRAW (GLOVE) ×2 IMPLANT
GLOVE NEODERM STRL 7.5  LF PF (GLOVE) ×9
GLOVE NEODERM STRL 7.5 LF PF (GLOVE) ×9 IMPLANT
GLOVE SURG NEODERM 7.5  LF PF (GLOVE) ×3
GLOVE SURG SS PI 6.5 STRL IVOR (GLOVE) ×8 IMPLANT
GOWN STRL REUS W/ TWL LRG LVL3 (GOWN DISPOSABLE) ×14 IMPLANT
GOWN STRL REUS W/TWL LRG LVL3 (GOWN DISPOSABLE) ×20
HEMOSTAT POWDER SURGIFOAM 1G (HEMOSTASIS) ×8 IMPLANT
INSERT FOGARTY XLG (MISCELLANEOUS) ×3 IMPLANT
INSERT SUTURE HOLDER (MISCELLANEOUS) ×4 IMPLANT
KIT BASIN OR (CUSTOM PROCEDURE TRAY) ×4 IMPLANT
KIT SUCTION CATH 14FR (SUCTIONS) ×4 IMPLANT
KIT TURNOVER KIT B (KITS) ×4 IMPLANT
KIT VASOVIEW HEMOPRO 2 VH 4000 (KITS) ×4 IMPLANT
MARKER GRAFT CORONARY BYPASS (MISCELLANEOUS) ×12 IMPLANT
NDL 18GX1X1/2 (RX/OR ONLY) (NEEDLE) ×1 IMPLANT
NEEDLE 18GX1X1/2 (RX/OR ONLY) (NEEDLE) ×8 IMPLANT
NS IRRIG 1000ML POUR BTL (IV SOLUTION) ×20 IMPLANT
PACK E OPEN HEART (SUTURE) ×4 IMPLANT
PACK OPEN HEART (CUSTOM PROCEDURE TRAY) ×4 IMPLANT
PACK SPY-PHI (KITS) ×2 IMPLANT
PAD ARMBOARD 7.5X6 YLW CONV (MISCELLANEOUS) ×8 IMPLANT
PAD ELECT DEFIB RADIOL ZOLL (MISCELLANEOUS) ×4 IMPLANT
PENCIL BUTTON HOLSTER BLD 10FT (ELECTRODE) ×4 IMPLANT
POSITIONER HEAD DONUT 9IN (MISCELLANEOUS) ×4 IMPLANT
POWDER SURGICEL 3.0 GRAM (HEMOSTASIS) ×4 IMPLANT
SEALANT SURG COSEAL 4ML (VASCULAR PRODUCTS) ×4 IMPLANT
SEALANT SURG COSEAL 8ML (VASCULAR PRODUCTS) ×3 IMPLANT
SET CARDIOPLEGIA MPS 5001102 (MISCELLANEOUS) ×2 IMPLANT
SHEARS HARMONIC 9CM CVD (BLADE) IMPLANT
SUPPORT HEART JANKE-BARRON (MISCELLANEOUS) ×4 IMPLANT
SUT BONE WAX W31G (SUTURE) ×4 IMPLANT
SUT MNCRL AB 3-0 PS2 18 (SUTURE) ×8 IMPLANT
SUT PDS AB 1 CTX 36 (SUTURE) ×8 IMPLANT
SUT PROLENE 3 0 SH DA (SUTURE) ×4 IMPLANT
SUT PROLENE 4 0 RB 1 (SUTURE) ×4
SUT PROLENE 4-0 RB1 .5 CRCL 36 (SUTURE) ×1 IMPLANT
SUT PROLENE 5 0 C 1 36 (SUTURE) ×2 IMPLANT
SUT PROLENE 6 0 C 1 30 (SUTURE) ×12 IMPLANT
SUT PROLENE 8 0 BV175 6 (SUTURE) ×2 IMPLANT
SUT PROLENE BLUE 7 0 (SUTURE) ×4 IMPLANT
SUT SILK  1 MH (SUTURE) ×4
SUT SILK 1 MH (SUTURE) ×2 IMPLANT
SUT SILK 2 0 SH CR/8 (SUTURE) IMPLANT
SUT SILK 3 0 SH CR/8 (SUTURE) IMPLANT
SUT STEEL 6MS V (SUTURE) ×4 IMPLANT
SUT STEEL SZ 6 DBL 3X14 BALL (SUTURE) ×4 IMPLANT
SUT VIC AB 2-0 CT1 27 (SUTURE)
SUT VIC AB 2-0 CT1 TAPERPNT 27 (SUTURE) IMPLANT
SUT VIC AB 2-0 CTX 27 (SUTURE) IMPLANT
SUT VIC AB 3-0 SH 27 (SUTURE)
SUT VIC AB 3-0 SH 27X BRD (SUTURE) IMPLANT
SUT VIC AB 3-0 X1 27 (SUTURE) IMPLANT
SYR 10ML LL (SYRINGE) IMPLANT
SYR 30ML LL (SYRINGE) ×6 IMPLANT
SYR 3ML LL SCALE MARK (SYRINGE) ×4 IMPLANT
SYR 50ML SLIP (SYRINGE) IMPLANT
SYSTEM SAHARA CHEST DRAIN ATS (WOUND CARE) ×4 IMPLANT
TAPE CLOTH SURG 4X10 WHT LF (GAUZE/BANDAGES/DRESSINGS) ×2 IMPLANT
TAPE PAPER 2X10 WHT MICROPORE (GAUZE/BANDAGES/DRESSINGS) ×2 IMPLANT
TIP DUAL SPRAY TOPICAL (TIP) ×6 IMPLANT
TOWEL GREEN STERILE (TOWEL DISPOSABLE) ×4 IMPLANT
TOWEL GREEN STERILE FF (TOWEL DISPOSABLE) ×4 IMPLANT
TRAY FOLEY SLVR 16FR TEMP STAT (SET/KITS/TRAYS/PACK) ×4 IMPLANT
TUBING LAP HI FLOW INSUFFLATIO (TUBING) ×4 IMPLANT
UNDERPAD 30X36 HEAVY ABSORB (UNDERPADS AND DIAPERS) ×4 IMPLANT
WATER STERILE IRR 1000ML POUR (IV SOLUTION) ×8 IMPLANT
WATER STERILE IRR 1000ML UROMA (IV SOLUTION) IMPLANT

## 2020-04-27 NOTE — Progress Notes (Signed)
  Echocardiogram Echocardiogram Transesophageal has been performed.  Delcie Roch 04/27/2020, 10:09 AM

## 2020-04-27 NOTE — Anesthesia Procedure Notes (Signed)
Central Venous Catheter Insertion Performed by: [name], MD, anesthesiologist Start/End2/04/2020 8:06 AM, 04/27/2020 8:16 AM Preanesthetic checklist: patient identified, IV checked, risks and benefits discussed, surgical consent, monitors and equipment checked, pre-op evaluation, timeout performed and anesthesia consent Position: Trendelenburg Lidocaine 1% used for infiltration and patient sedated Hand hygiene performed , maximum sterile barriers used  and Seldinger technique used Catheter size: 8.5 Fr Central line was placed.Sheath introducer Procedure performed using ultrasound guided technique. Ultrasound Notes:anatomy identified, needle tip was noted to be adjacent to the nerve/plexus identified, no ultrasound evidence of intravascular and/or intraneural injection and image(s) printed for medical record Attempts: 1 Following insertion, line sutured, dressing applied and Biopatch. Post procedure assessment: blood return through all ports, free fluid flow and no air  Patient tolerated the procedure well with no immediate complications.

## 2020-04-27 NOTE — Anesthesia Procedure Notes (Signed)
Central Venous Catheter Insertion Performed by: [name], MD, anesthesiologist Start/End2/04/2020 8:16 AM, 04/27/2020 8:17 AM Patient location: Pre-op. Preanesthetic checklist: patient identified, IV checked, risks and benefits discussed, surgical consent, monitors and equipment checked, pre-op evaluation, timeout performed and anesthesia consent Position: Trendelenburg Hand hygiene performed  and maximum sterile barriers used  Total catheter length 10. PA cath was placed.Swan type:thermodilution PA Cath depth:48 Procedure performed without using ultrasound guided technique. Attempts: 1 Patient tolerated the procedure well with no immediate complications.

## 2020-04-27 NOTE — Anesthesia Postprocedure Evaluation (Signed)
Anesthesia Post Note  Patient: [name]  Procedure(s) Performed: CORONARY ARTERY BYPASS GRAFTING (CABG) TIMES TWO USING BILATERAL INTERNAL MAMMARY ARTERIES (N/A Chest) TRANSESOPHAGEAL ECHOCARDIOGRAM (TEE) (N/A ) INDOCYANINE GREEN FLUORESCENCE IMAGING (ICG) (N/A )     Patient location during evaluation: ICU Anesthesia Type: General Level of consciousness: sedated and patient remains intubated per anesthesia plan Pain management: pain level controlled Vital Signs Assessment: post-procedure vital signs reviewed and stable Respiratory status: patient remains intubated per anesthesia plan Cardiovascular status: stable Postop Assessment: no apparent nausea or vomiting Anesthetic complications: no   No complications documented.  Last Vitals:  Vitals:   04/27/20 1320 04/27/20 1341  BP: 129/76   Pulse: 80   Resp: 12   Temp:    SpO2: 95% 97%    Last Pain:  Vitals:   04/27/20 0446  TempSrc: Oral  PainSc:                  [name]

## 2020-04-27 NOTE — Transfer of Care (Signed)
Immediate Anesthesia Transfer of Care Note  Patient: [name]  Procedure(s) Performed: CORONARY ARTERY BYPASS GRAFTING (CABG) TIMES TWO USING BILATERAL INTERNAL MAMMARY ARTERIES (N/A Chest) TRANSESOPHAGEAL ECHOCARDIOGRAM (TEE) (N/A ) INDOCYANINE GREEN FLUORESCENCE IMAGING (ICG) (N/A )  Patient Location: SICU  Anesthesia Type:General  Level of Consciousness: sedated, patient cooperative and Patient remains intubated per anesthesia plan  Airway & Oxygen Therapy: Patient remains intubated per anesthesia plan and Patient placed on Ventilator (see vital sign flow sheet for setting)  Post-op Assessment: Report given to RN and Post -op Vital signs reviewed and stable  Post vital signs: Reviewed and stable  Last Vitals:  Vitals Value Taken Time  BP 129/76 04/27/20 1325  Temp    Pulse 80 04/27/20 1325  Resp 12 04/27/20 1325  SpO2 95 % 04/27/20 1325    Last Pain:  Vitals:   04/27/20 0446  TempSrc: Oral  PainSc:       Patients Stated Pain Goal: 1 (04/25/20 1936)  Complications: No complications documented.

## 2020-04-27 NOTE — Op Note (Signed)
CARDIOTHORACIC SURGERY OPERATIVE NOTE  Date of Procedure: 04/27/2020  Preoperative Diagnosis: Severe proximal Coronary Artery Disease  Postoperative Diagnosis: Same  Procedure:    Coronary Artery Bypass Grafting x 2   Left Internal Mammary Artery to Distal Left Anterior Descending Coronary Artery; pedicled RIMA Graft to Obtuse Marginal Branch of Left Circumflex Coronary Artery Bilateral IMA harvesting Completion graft surveillance with indocyanine green fluorescence imaging (SPY)   Surgeon: B. Lorayne Marek, MD  Assistant: Jaclyn Prime, PA-C  Anesthesia: get  Operative Findings:  preserved left ventricular systolic function  good quality  mammary artery conduits  good quality target vessels for grafting    BRIEF CLINICAL NOTE AND INDICATIONS FOR SURGERY  65 yo lady has been evaluated for interstitial lung disease. CT scan of chest showed calcification in area of coronary arteries. She was referred for cardiology evaluation and endorsed shortness of breath and atypical chest pain. LHC showed severe proximal LAD and LCx disease. Referred for CABG. She has been thoroughly evaluated and is considered an excellent candidate for surgery.   DETAILS OF THE OPERATIVE PROCEDURE  Preparation:  The patient is brought to the operating room on the above mentioned date and central monitoring was established by the anesthesia team including placement of Swan-Ganz catheter and radial arterial line. The patient is placed in the supine position on the operating table.  Intravenous antibiotics are administered. General endotracheal anesthesia is induced uneventfully. A Foley catheter is placed.  Baseline transesophageal echocardiogram was performed.  Findings were notable for preserved LV function and no significant valvular disease  The patient's chest, abdomen, both groins, and both lower extremities are prepared and draped in a sterile manner. A time out procedure is performed.   Surgical  Approach and Conduit Harvest:  A median sternotomy incision was performed and the left internal mammary artery is dissected from the chest wall and prepared for bypass grafting. The left internal mammary artery is notably good quality conduit.  Next, attention is turned to the right chest wall of the right internal mammary artery and its pedicle mobilized in standard fashion.  Prior to dividing the mammary artery pedicles distally full dose heparin is given intravenously.  The pedicles were then divided and treated with a solution of papaverine.  Multilevel rib block was undertaken by injecting Exparel solution into the exposed intercostal spaces.   Extracorporeal Cardiopulmonary Bypass and Myocardial Protection:  The pericardium is opened. The ascending aorta is nondiseased in appearance. The ascending aorta and the right atrium are cannulated for cardiopulmonary bypass.  Adequate heparinization is verified.     The entire pre-bypass portion of the operation was notable for stable hemodynamics.  Cardiopulmonary bypass was begun and the surface of the heart is inspected. Distal target vessels are selected for coronary artery bypass grafting. A cardioplegia cannula is placed in the ascending aorta.    The patient is allowed to cool passively to 35C systemic temperature.  The aortic cross clamp is applied and cold blood cardioplegia is delivered initially in an antegrade fashion through the aortic root.   Iced saline slush is applied for topical hypothermia.  The initial cardioplegic arrest is rapid with early diastolic arrest.  Repeat doses of cardioplegia are administered intermittently throughout the entire cross clamp portion of the operation through the aortic root, and through subsequently placed vein grafts in order to maintain completely flat electrocardiogram.   Coronary Artery Bypass Grafting:   The obtuse marginal branch of the left circumflex coronary artery was grafted using the  pedicled right  internal mammary artery graft which was brought through the transverse sinus.  The anastomosis was done in an end-to-side fashion.  At the site of distal anastomosis the target vessel was good quality and measured approximately 1.5 mm in diameter. Anastomotic patency and runoff was confirmed with indocyanine green fluorescence imaging (SPY).  The distal left anterior coronary artery was grafted with the left internal mammary artery in an end-to-side fashion.  At the site of distal anastomosis the target vessel was good quality and measured approximately 1.5 mm in diameter. Anastomotic patency and runoff was confirmed with indocyanine green fluorescence imaging (SPY).  A hotshot dose of cardioplegia was given down the aortic root; de-airing procedures were then performed and the aortic cross-clamp was removed   Procedure Completion:  The distal coronary anastomoses were inspected for hemostasis and appropriate graft orientation. Epicardial pacing wires are fixed to the right ventricular outflow tract and to the right atrial appendage. The patient is rewarmed to 37C temperature. The patient is weaned and disconnected from cardiopulmonary bypass.  The patient's rhythm at separation from bypass was sinus bradycardia.  The patient was weaned from cardiopulmonary bypass  without any inotropic support.   Followup transesophageal echocardiogram performed after separation from bypass revealed no changes from the preoperative exam.  The aortic and venous cannula were removed uneventfully. Protamine was administered to reverse the anticoagulation. The mediastinum and pleural space were inspected for hemostasis and irrigated with saline solution. The mediastinum and both pleural spaces were drained using fluted chest tubes placed through separate stab incisions inferiorly.  The soft tissues anterior to the aorta were reapproximated loosely. The sternum is closed with double strength sternal wire.  The soft tissues anterior to the sternum were closed in multiple layers and the skin is closed with a running subcuticular skin closure.  The post-bypass portion of the operation was notable for stable rhythm and hemodynamics.    Disposition:  The patient tolerated the procedure well and is transported to the surgical intensive care in stable condition. There are no intraoperative complications. All sponge instrument and needle counts are verified correct at completion of the operation.    Brantley Fling, MD 04/27/2020 1:35 PM

## 2020-04-27 NOTE — H&P (Signed)
History and Physical Interval Note:  04/27/2020 7:43 AM  [name]  has presented today for surgery, with the diagnosis of CAD LMD.  The various methods of treatment have been discussed with the patient and family. After consideration of risks, benefits and other options for treatment, the patient has consented to  Procedure(s) with comments: CORONARY ARTERY BYPASS GRAFTING (CABG) (N/A) - BIMA possible RADIAL ARTERY HARVEST (Left) TRANSESOPHAGEAL ECHOCARDIOGRAM (TEE) (N/A) INDOCYANINE GREEN FLUORESCENCE IMAGING (ICG) (N/A) as a surgical intervention.  The patient's history has been reviewed, patient examined, no change in status, stable for surgery.  I have reviewed the patient's chart and labs.  Questions were answered to the patient's satisfaction.     Linden Dolin

## 2020-04-27 NOTE — Progress Notes (Signed)
Nif -20 VC 

## 2020-04-27 NOTE — Brief Op Note (Signed)
04/25/2020 - 04/27/2020  11:47 AM  PATIENT:  [name]  65 y.o. female  PRE-OPERATIVE DIAGNOSIS:  Coronary Artery Disease Left Main Coronary Artery Disease  POST-OPERATIVE DIAGNOSIS:  Coronary Artery Disease Left Main Coronary Artery Disease  PROCEDURE:  Procedure(s) with comments:  CORONARY ARTERY BYPASS GRAFTING x 2 -LIMA to LAD -RIMA to OM  TRANSESOPHAGEAL ECHOCARDIOGRAM (TEE) (N/A)  INDOCYANINE GREEN FLUORESCENCE IMAGING (ICG) (N/A)  SURGEON:  Surgeon(s) and Role:    * Linden Dolin, MD - Primary  PHYSICIAN ASSISTANT: Lowella Dandy PA-C  ANESTHESIA:   general  EBL:  Per anes record   BLOOD ADMINISTERED: 1 unit PRBC and CELLSAVER  DRAINS: Left and Right Pleural Chest Tubes, Mediastinal Chest Drains   LOCAL MEDICATIONS USED:  BUPIVICAINE   SPECIMEN:  No Specimen  DISPOSITION OF SPECIMEN:  N/A  COUNTS:  YES  TOURNIQUET:  * No tourniquets in log *  DICTATION: .Dragon Dictation  PLAN OF CARE: Admit to inpatient   PATIENT DISPOSITION:  ICU - intubated and hemodynamically stable.   Delay start of Pharmacological VTE agent (>24hrs) due to surgical blood loss or risk of bleeding: yes

## 2020-04-27 NOTE — Anesthesia Procedure Notes (Signed)
Procedure Name: Intubation Date/Time: 04/27/2020 8:49 AM Performed by: Rosiland Oz, CRNA Pre-anesthesia Checklist: Patient identified, Emergency Drugs available, Suction available, Patient being monitored and Timeout performed Patient Re-evaluated:Patient Re-evaluated prior to induction Oxygen Delivery Method: Circle system utilized Preoxygenation: Pre-oxygenation with 100% oxygen Induction Type: IV induction Ventilation: Mask ventilation without difficulty Laryngoscope Size: Miller and 2 Grade View: Grade I Tube type: Oral Tube size: 7.5 mm Number of attempts: 1 Airway Equipment and Method: Stylet Placement Confirmation: ETT inserted through vocal cords under direct vision,  positive ETCO2 and breath sounds checked- equal and bilateral Secured at: 21 cm Tube secured with: Tape Dental Injury: Teeth and Oropharynx as per pre-operative assessment

## 2020-04-27 NOTE — Progress Notes (Signed)
Initial post op ABG results at 1338 - orders to increase vent rate from 12 to 16. Rechecked ABG at 1452 - orders to increase vent rate from 16 to 18, give 2 amps bicarb, and start xopenex nebs. Repeat ABG at 1552. Precedex stopped at 1650. At 1800, patient with RASS -4, minimal facial movement to stimulus and not initiating breaths on vent. Pupils equal, briskly reactive. ABG checked at 1803 with minimal improvement from previous ABGs. Dr. Tyrone Sage notified - orders to increase vent rate from 18 to 20 and increase tidal volume from 380 to 450. RT aware. Will recheck ABG in an hour.    Results for [name], [name] (MRN [id number]) as of 04/27/2020 18:24  Ref. Range 04/27/2020 13:38 04/27/2020 14:52 04/27/2020 15:52 04/27/2020 18:03  Sample type Unknown ARTERIAL ARTERIAL ARTERIAL ARTERIAL  pH, Arterial Latest Ref Range: 7.350 - 7.450  7.265 (L) 7.237 (L) 7.310 (L) 7.308 (L)  pCO2 arterial Latest Ref Range: 32.0 - 48.0 mmHg 51.6 (H) 53.4 (H) 55.3 (H) 49.2 (H)  pO2, Arterial Latest Ref Range: 83.0 - 108.0 mmHg 73 (L) 90 96 116 (H)  TCO2 Latest Ref Range: 22 - 32 mmol/L 26 25 30 26   Acid-Base Excess Latest Ref Range: 0.0 - 2.0 mmol/L   1.0   Acid-base deficit Latest Ref Range: 0.0 - 2.0 mmol/L 4.0 (H) 5.0 (H)  2.0  Bicarbonate Latest Ref Range: 20.0 - 28.0 mmol/L 24.0 23.2 28.2 (H) 24.8  O2 Saturation Latest Units: % 94.0 96.0 97.0 98.0  Patient temperature Unknown 34.9 C 35.3 C 36.0 C 36.3 C

## 2020-04-27 NOTE — Procedures (Signed)
Extubation Procedure Note  Patient Details:   Name: [name] DOB: March 07, 1956 MRN: [id number]     Evaluation  O2 sats: stable throughout Complications: No apparent complications Patient did tolerate procedure well. Bilateral Breath Sounds: Clear,Diminished      Patient extubated to 3L humidified oxygen with RN at bedside.  VC 573 ml, Nif -20, positive cuff leak, patient able to speak.  VS stable.  Phill Myron 04/27/2020, 9:32 PM

## 2020-04-27 NOTE — Progress Notes (Signed)
Patient ID: [name], female   DOB: 12-Sep-1955, 65 y.o.   MRN: [id number] EVENING ROUNDS NOTE :     [address]       [place] [address]             [phone]                 Day of Surgery Procedure(s) (LRB): CORONARY ARTERY BYPASS GRAFTING (CABG) TIMES TWO USING BILATERAL INTERNAL MAMMARY ARTERIES (N/A) TRANSESOPHAGEAL ECHOCARDIOGRAM (TEE) (N/A) INDOCYANINE GREEN FLUORESCENCE IMAGING (ICG) (N/A)  Total Length of Stay:  LOS: 2 days  BP 129/76   Pulse 80   Temp 98.6 F (37 C) (Oral)   Resp 12   Ht 5\' 1"  (1.549 m)   Wt 69.7 kg   SpO2 97%   BMI 29.03 kg/m   .Intake/Output      02/01 0701 02/02 0700 02/02 0701 02/03 0700   P.O. 480    I.V. (mL/kg)  2250 (32.3)   Blood  200   IV Piggyback  500   Total Intake(mL/kg) 480 (6.9) 2950 (42.3)   Urine (mL/kg/hr) 0 (0) 350 (0.6)   Emesis/NG output 200    Blood  405   Total Output 200 755   Net +280 +2195        Urine Occurrence 2 x      . sodium chloride 20 mL/hr at 04/27/20 1348  . [START ON 04/28/2020] sodium chloride    . sodium chloride 10 mL/hr at 04/27/20 1357  . albumin human 12.5 g (04/27/20 1500)  . cefUROXime (ZINACEF)  IV    . dexmedetomidine (PRECEDEX) IV infusion 0.7 mcg/kg/hr (04/27/20 1356)  . electrolyte-148    . famotidine (PEPCID) IV 20 mg (04/27/20 1349)  . insulin 1.4 Units/hr (04/27/20 1330)  . lactated ringers    . lactated ringers    . lactated ringers 20 mL/hr at 04/27/20 1330  . magnesium sulfate 4 g (04/27/20 1351)  . nitroGLYCERIN 0 mcg/min (04/27/20 1330)  . phenylephrine (NEO-SYNEPHRINE) Adult infusion 20 mcg/min (04/27/20 1330)  . potassium chloride 10 mEq (04/27/20 1421)  . vancomycin       Lab Results  Component Value Date   WBC 16.0 (H) 04/27/2020   HGB 9.1 (L) 04/27/2020   HCT 28.3 (L) 04/27/2020   PLT 151 04/27/2020   GLUCOSE 140 (H) 04/27/2020   CHOL 160 04/27/2020   TRIG 136 04/27/2020   HDL 40 (L) 04/27/2020   LDLCALC 93 04/27/2020   ALT 16  08/16/2011   AST 20 08/16/2011   NA 141 04/27/2020   K 4.1 04/27/2020   CL 107 04/27/2020   CREATININE 0.60 04/27/2020   BUN 12 04/27/2020   CO2 23 04/27/2020   INR 1.4 (H) 04/27/2020   HGBA1C 6.3 (H) 04/27/2020   Early post op, not bleeding Bicarbonate just given and vent adjusted basted on abg    06/25/2020 MD  Beeper [phone] Office [phone] 04/27/2020 3:03 PM

## 2020-04-27 NOTE — Anesthesia Procedure Notes (Signed)
Arterial Line Insertion Start/End2/04/2020 7:45 AM, 04/27/2020 7:55 AM Performed by: Rosiland Oz, CRNA  Preanesthetic checklist: patient identified, IV checked, site marked, risks and benefits discussed, surgical consent, monitors and equipment checked, pre-op evaluation, timeout performed and anesthesia consent Lidocaine 1% used for infiltration Right, radial was placed Catheter size: 20 G Hand hygiene performed , maximum sterile barriers used  and Seldinger technique used  Attempts: 1 Procedure performed without using ultrasound guided technique. Following insertion, dressing applied and Biopatch. Post procedure assessment: normal and unchanged  Patient tolerated the procedure well with no immediate complications.

## 2020-04-27 NOTE — Hospital Course (Addendum)
History of Present Illness:  [name] is a 65 yo female with known history of Hypertension and Hyperlipidemia.  The patient's issues started back in January 2020 when she developed a cough.  CXR obtained at that time was normal.  She developed right sided chest pain in June of 2020 which radiated to the RUQ.  CT scan was obtained and showed inflammatory and scarring changes in the lung.  She was not found to have a pulmonary embolism.  She was referred to Pulmonology at [facility] who performed PFTs tests.  These showed the patient to have reduced FVC with mildly decreased TL and DLCO.  She was recommended to take steroids, however the patient refused.  As when previously on she gained a significant amount of weight.  She had since lost 60 lbs.  Follow up CT scan in November she was noted to have bilateral diffuse mosaic pattern, question pneumonitis as well as findings of moderate coronary calcifications.  It was felt she should have a bronchoscopy, however this wasn't performed due to abnormal EKG preoperatively.  She was referred to Cardiology for evaluation and due to calcifications it was felt the patient should undergo cardiac catheterization.  This was performed on 1/31 and showed Left Main disease.  It was felt the patient would require coronary bypass grafting and Cardiothoracic surgery consultation was requested.  The patient was evaluated by Dr. Vickey Sages who recommended coronary bypass grafting.  The risks and benefits of the procedure were explained to the patient and she was agreeable to proceed.  However she wished to be discharged home prior to procedure.  Hospital Course:   [name] presented to [facility] on 04/27/2020.  She was taken to the operating room and underwent CABG x 2 utilizing LIMA to LAD and RIMA to OM.  She tolerated the procedure without difficulty and was taken to the SICU in stable condition.  The patient was treated for acidosis.  She was weaned and extubated from the vent  the evening of surgery.  She remains in NSR.  Her chest tube and arterial lines were removed without difficulty.

## 2020-04-28 ENCOUNTER — Inpatient Hospital Stay (HOSPITAL_COMMUNITY): Payer: Medicare Other

## 2020-04-28 ENCOUNTER — Encounter (HOSPITAL_COMMUNITY): Payer: Self-pay | Admitting: Cardiothoracic Surgery

## 2020-04-28 DIAGNOSIS — I25110 Atherosclerotic heart disease of native coronary artery with unstable angina pectoris: Secondary | ICD-10-CM

## 2020-04-28 LAB — GLUCOSE, CAPILLARY
Glucose-Capillary: 102 mg/dL — ABNORMAL HIGH (ref 70–99)
Glucose-Capillary: 113 mg/dL — ABNORMAL HIGH (ref 70–99)
Glucose-Capillary: 116 mg/dL — ABNORMAL HIGH (ref 70–99)
Glucose-Capillary: 119 mg/dL — ABNORMAL HIGH (ref 70–99)
Glucose-Capillary: 123 mg/dL — ABNORMAL HIGH (ref 70–99)
Glucose-Capillary: 126 mg/dL — ABNORMAL HIGH (ref 70–99)
Glucose-Capillary: 133 mg/dL — ABNORMAL HIGH (ref 70–99)
Glucose-Capillary: 134 mg/dL — ABNORMAL HIGH (ref 70–99)
Glucose-Capillary: 135 mg/dL — ABNORMAL HIGH (ref 70–99)

## 2020-04-28 LAB — BASIC METABOLIC PANEL
Anion gap: 7 (ref 5–15)
Anion gap: 7 (ref 5–15)
BUN: 10 mg/dL (ref 8–23)
BUN: 13 mg/dL (ref 8–23)
CO2: 23 mmol/L (ref 22–32)
CO2: 24 mmol/L (ref 22–32)
Calcium: 8.7 mg/dL — ABNORMAL LOW (ref 8.9–10.3)
Calcium: 9.2 mg/dL (ref 8.9–10.3)
Chloride: 110 mmol/L (ref 98–111)
Chloride: 105 mmol/L (ref 98–111)
Creatinine, Ser: 0.73 mg/dL (ref 0.44–1.00)
Creatinine, Ser: 0.87 mg/dL (ref 0.44–1.00)
GFR, Estimated: >60 mL/min (ref >60)
GFR, Estimated: >60 mL/min (ref >60)
Glucose, Bld: 101 mg/dL — ABNORMAL HIGH (ref 70–99)
Glucose, Bld: 120 mg/dL — ABNORMAL HIGH (ref 70–99)
Potassium: 4 mmol/L (ref 3.5–5.1)
Potassium: 4.3 mmol/L (ref 3.5–5.1)
Sodium: 140 mmol/L (ref 135–145)
Sodium: 136 mmol/L (ref 135–145)

## 2020-04-28 LAB — CBC
HCT: 26.2 % — ABNORMAL LOW (ref 36.0–46.0)
HCT: 24 % — ABNORMAL LOW (ref 36.0–46.0)
Hemoglobin: 8.7 g/dL — ABNORMAL LOW (ref 12.0–15.0)
Hemoglobin: 8.2 g/dL — ABNORMAL LOW (ref 12.0–15.0)
MCH: 30.3 pg (ref 26.0–34.0)
MCH: 31.4 pg (ref 26.0–34.0)
MCHC: 33.2 g/dL (ref 30.0–36.0)
MCHC: 34.2 g/dL (ref 30.0–36.0)
MCV: 91.3 fL (ref 80.0–100.0)
MCV: 92 fL (ref 80.0–100.0)
Platelets: 162 10*3/uL (ref 150–400)
Platelets: 149 10*3/uL — ABNORMAL LOW (ref 150–400)
RBC: 2.87 MIL/uL — ABNORMAL LOW (ref 3.87–5.11)
RBC: 2.61 MIL/uL — ABNORMAL LOW (ref 3.87–5.11)
RDW: 14.1 % (ref 11.5–15.5)
RDW: 14.3 % (ref 11.5–15.5)
WBC: 14.1 10*3/uL — ABNORMAL HIGH (ref 4.0–10.5)
WBC: 15.2 10*3/uL — ABNORMAL HIGH (ref 4.0–10.5)
nRBC: 0 % (ref 0.0–0.2)
nRBC: 0 % (ref 0.0–0.2)

## 2020-04-28 LAB — ECHOCARDIOGRAM COMPLETE
Area-P 1/2: 2.32 cm2
Calc EF: 45.8 %
Height: 61 in
S' Lateral: 3 cm
Single Plane A2C EF: 46.9 %
Single Plane A4C EF: 43.1 %
Weight: 2783.09 oz

## 2020-04-28 LAB — MAGNESIUM
Magnesium: 2.7 mg/dL — ABNORMAL HIGH (ref 1.7–2.4)
Magnesium: 2.4 mg/dL (ref 1.7–2.4)

## 2020-04-28 MED ORDER — PERFLUTREN LIPID MICROSPHERE
1.0000 mL | INTRAVENOUS | Status: AC | PRN
  Administered 2020-04-28: 2 mL via INTRAVENOUS
  Filled 2020-04-28: qty 10

## 2020-04-28 MED ORDER — DIAZEPAM 2 MG PO TABS
2.0000 mg | ORAL_TABLET | Freq: Three times a day (TID) | ORAL | Status: AC
  Administered 2020-04-28 – 2020-04-29 (×6): 2 mg via ORAL
  Filled 2020-04-28 (×7): qty 1

## 2020-04-28 MED ORDER — KETOROLAC TROMETHAMINE 15 MG/ML IJ SOLN
7.5000 mg | Freq: Four times a day (QID) | INTRAMUSCULAR | Status: AC
  Administered 2020-04-28 – 2020-04-29 (×5): 7.5 mg via INTRAVENOUS
  Filled 2020-04-28 (×5): qty 1

## 2020-04-28 MED ORDER — THIAMINE HCL 100 MG/ML IJ SOLN
Freq: Once | INTRAVENOUS | Status: AC
  Filled 2020-04-28: qty 1000

## 2020-04-28 MED ORDER — ACETYLCYSTEINE 20 % IN SOLN
2.0000 mL | Freq: Two times a day (BID) | RESPIRATORY_TRACT | Status: AC
  Administered 2020-04-28 – 2020-04-29 (×4): 2 mL via RESPIRATORY_TRACT
  Filled 2020-04-28 (×4): qty 4
  Filled 2020-04-28: qty 2

## 2020-04-28 MED ORDER — COLCHICINE 0.3 MG HALF TABLET
0.3000 mg | ORAL_TABLET | Freq: Two times a day (BID) | ORAL | Status: DC
  Administered 2020-04-28 – 2020-05-03 (×12): 0.3 mg via ORAL
  Filled 2020-04-28 (×14): qty 1

## 2020-04-28 NOTE — Progress Notes (Signed)
      [address]       [place] [address]             [phone]      POD # 1 CABG x 2  Having IV placed currently  BP 102/89   Pulse 73   Temp 98.5 F (36.9 C) (Oral)   Resp (!) 21   Ht 5\' 1"  (1.549 m)   Wt 78.9 kg   SpO2 93%   BMI 32.87 kg/m   On 4L [place]  Intake/Output Summary (Last 24 hours) at 04/28/2020 1739 Last data filed at 04/28/2020 1700 Gross per 24 hour  Intake 3408.47 ml  Output 1490 ml  Net 1918.47 ml   CBG well controlled PM labs pending Doing well POD # 1  Ej Pinson C. 06/26/2020, MD [facility] [phone]

## 2020-04-28 NOTE — Progress Notes (Signed)
  Echocardiogram 2D Echocardiogram has been performed.  [name] 04/28/2020, 9:37 AM

## 2020-04-28 NOTE — Plan of Care (Signed)
  Problem: Education: Goal: Knowledge of disease or condition will improve Outcome: Progressing   Problem: Activity: Goal: Risk for activity intolerance will decrease Outcome: Progressing   Problem: Cardiac: Goal: Will achieve and/or maintain hemodynamic stability Outcome: Progressing   Problem: Clinical Measurements: Goal: Postoperative complications will be avoided or minimized Outcome: Progressing   Problem: Respiratory: Goal: Respiratory status will improve Outcome: Progressing   

## 2020-04-28 NOTE — Progress Notes (Signed)
1 Day Post-Op Procedure(s) (LRB): CORONARY ARTERY BYPASS GRAFTING (CABG) TIMES TWO USING BILATERAL INTERNAL MAMMARY ARTERIES (N/A) TRANSESOPHAGEAL ECHOCARDIOGRAM (TEE) (N/A) INDOCYANINE GREEN FLUORESCENCE IMAGING (ICG) (N/A) Subjective: Back pain  Objective: Vital signs in last 24 hours: Temp:  [94.82 F (34.9 C)-100 F (37.8 C)] 98.7 F (37.1 C) (02/03 1105) Pulse Rate:  [65-95] 74 (02/03 1247) Cardiac Rhythm: Normal sinus rhythm (02/03 1230) Resp:  [9-27] 26 (02/03 1247) BP: (81-125)/(50-80) 93/50 (02/03 1247) SpO2:  [91 %-100 %] 92 % (02/03 1247) Arterial Line BP: (98-171)/(46-86) 138/57 (02/03 0745) FiO2 (%):  [36 %-50 %] 36 % (02/03 0900) Weight:  [78.9 kg] 78.9 kg (02/03 0500)  Hemodynamic parameters for last 24 hours: PAP: (16-34)/(7-21) 25/11 CO:  [2.2 L/min-3.9 L/min] 3.8 L/min CI:  [1.3 L/min/m2-2.3 L/min/m2] 2.3 L/min/m2  Intake/Output from previous day: 02/02 0701 - 02/03 0700 In: 7257 [P.O.:140; I.V.:4651.5; Blood:200; IV Piggyback:2265.5] Out: 2135 [Urine:1120; Blood:405; Chest Tube:610] Intake/Output this shift: Total I/O In: 357.3 [I.V.:357.3] Out: 260 [Urine:110; Chest Tube:150]  General appearance: alert and cooperative Neurologic: intact Heart: regular rate and rhythm, S1, S2 normal, no murmur, click, rub or gallop Lungs: clear to auscultation bilaterally Abdomen: soft, non-tender; bowel sounds normal; no masses,  no organomegaly Extremities: edema mild Wound: dressed, dry  Lab Results: Recent Labs    04/27/20 1942 04/27/20 2116 04/27/20 2231 04/28/20 0326  WBC 12.4*  --   --  14.1*  HGB 8.9*   < > 8.2* 8.7*  HCT 26.3*   < > 24.0* 26.2*  PLT 141*  --   --  162   < > = values in this interval not displayed.   BMET:  Recent Labs    04/27/20 1942 04/27/20 2116 04/27/20 2231 04/28/20 0326  NA 142   < > 144 140  K 4.4   < > 3.9 4.0  CL 111  --   --  110  CO2 23  --   --  23  GLUCOSE 126*  --   --  101*  BUN 11  --   --  10   CREATININE 0.76  --   --  0.73  CALCIUM 8.3*  --   --  8.7*   < > = values in this interval not displayed.    PT/INR:  Recent Labs    04/27/20 1333  LABPROT 16.6*  INR 1.4*   ABG    Component Value Date/Time   PHART 7.341 (L) 04/27/2020 2231   HCO3 23.3 04/27/2020 2231   TCO2 25 04/27/2020 2231   ACIDBASEDEF 2.0 04/27/2020 2231   O2SAT 97.0 04/27/2020 2231   CBG (last 3)  Recent Labs    04/28/20 0602 04/28/20 0659 04/28/20 1103  GLUCAP 123* 119* 116*    Assessment/Plan: S/P Procedure(s) (LRB): CORONARY ARTERY BYPASS GRAFTING (CABG) TIMES TWO USING BILATERAL INTERNAL MAMMARY ARTERIES (N/A) TRANSESOPHAGEAL ECHOCARDIOGRAM (TEE) (N/A) INDOCYANINE GREEN FLUORESCENCE IMAGING (ICG) (N/A) Mobilize pain control  Gentle resuscitation   LOS: 3 days    [name] 04/28/2020

## 2020-04-29 ENCOUNTER — Inpatient Hospital Stay (HOSPITAL_COMMUNITY): Payer: Medicare Other

## 2020-04-29 LAB — BASIC METABOLIC PANEL
Anion gap: 8 (ref 5–15)
BUN: 11 mg/dL (ref 8–23)
CO2: 23 mmol/L (ref 22–32)
Calcium: 9.1 mg/dL (ref 8.9–10.3)
Chloride: 107 mmol/L (ref 98–111)
Creatinine, Ser: 0.79 mg/dL (ref 0.44–1.00)
GFR, Estimated: >60 mL/min (ref >60)
Glucose, Bld: 94 mg/dL (ref 70–99)
Potassium: 3.6 mmol/L (ref 3.5–5.1)
Sodium: 138 mmol/L (ref 135–145)

## 2020-04-29 LAB — CBC
HCT: 23.3 % — ABNORMAL LOW (ref 36.0–46.0)
Hemoglobin: 7.9 g/dL — ABNORMAL LOW (ref 12.0–15.0)
MCH: 31.2 pg (ref 26.0–34.0)
MCHC: 33.9 g/dL (ref 30.0–36.0)
MCV: 92.1 fL (ref 80.0–100.0)
Platelets: 134 10*3/uL — ABNORMAL LOW (ref 150–400)
RBC: 2.53 MIL/uL — ABNORMAL LOW (ref 3.87–5.11)
RDW: 14.3 % (ref 11.5–15.5)
WBC: 12.5 10*3/uL — ABNORMAL HIGH (ref 4.0–10.5)
nRBC: 0 % (ref 0.0–0.2)

## 2020-04-29 LAB — GLUCOSE, CAPILLARY
Glucose-Capillary: 93 mg/dL (ref 70–99)
Glucose-Capillary: 119 mg/dL — ABNORMAL HIGH (ref 70–99)
Glucose-Capillary: 122 mg/dL — ABNORMAL HIGH (ref 70–99)
Glucose-Capillary: 174 mg/dL — ABNORMAL HIGH (ref 70–99)

## 2020-04-29 MED ORDER — AMIODARONE HCL IN DEXTROSE 360-4.14 MG/200ML-% IV SOLN
30.0000 mg/h | INTRAVENOUS | Status: DC
  Administered 2020-04-29: 30 mg/h via INTRAVENOUS

## 2020-04-29 MED ORDER — FUROSEMIDE 10 MG/ML IJ SOLN
40.0000 mg | Freq: Two times a day (BID) | INTRAMUSCULAR | Status: DC
  Administered 2020-04-29 – 2020-05-02 (×7): 40 mg via INTRAVENOUS
  Filled 2020-04-29 (×7): qty 4

## 2020-04-29 MED ORDER — LEVALBUTEROL HCL 0.63 MG/3ML IN NEBU
0.6300 mg | INHALATION_SOLUTION | Freq: Two times a day (BID) | RESPIRATORY_TRACT | Status: DC
  Administered 2020-04-29 – 2020-05-02 (×6): 0.63 mg via RESPIRATORY_TRACT
  Filled 2020-04-29 (×6): qty 3

## 2020-04-29 MED ORDER — CYCLOBENZAPRINE HCL 10 MG PO TABS
10.0000 mg | ORAL_TABLET | Freq: Three times a day (TID) | ORAL | Status: DC
Start: 2020-04-29 — End: 2020-05-04
  Administered 2020-04-29 – 2020-05-03 (×13): 10 mg via ORAL
  Filled 2020-04-29 (×14): qty 1

## 2020-04-29 MED ORDER — AMIODARONE HCL IN DEXTROSE 360-4.14 MG/200ML-% IV SOLN
60.0000 mg/h | INTRAVENOUS | Status: AC
  Administered 2020-04-29 (×2): 60 mg/h via INTRAVENOUS
  Filled 2020-04-29 (×2): qty 200

## 2020-04-29 MED ORDER — POTASSIUM CHLORIDE CRYS ER 20 MEQ PO TBCR
20.0000 meq | EXTENDED_RELEASE_TABLET | ORAL | Status: AC
  Administered 2020-04-29 (×3): 20 meq via ORAL
  Filled 2020-04-29 (×2): qty 1

## 2020-04-29 MED ORDER — AMIODARONE IV BOLUS ONLY 150 MG/100ML
150.0000 mg | Freq: Once | INTRAVENOUS | Status: AC
  Administered 2020-04-29: 150 mg via INTRAVENOUS
  Filled 2020-04-29: qty 100

## 2020-04-29 MED FILL — Electrolyte-R (PH 7.4) Solution: INTRAVENOUS | Qty: 4000 | Status: AC

## 2020-04-29 MED FILL — Mannitol IV Soln 20%: INTRAVENOUS | Qty: 500 | Status: AC

## 2020-04-29 MED FILL — Heparin Sodium (Porcine) Inj 1000 Unit/ML: INTRAMUSCULAR | Qty: 30 | Status: AC

## 2020-04-29 MED FILL — Potassium Chloride Inj 2 mEq/ML: INTRAVENOUS | Qty: 40 | Status: AC

## 2020-04-29 MED FILL — Magnesium Sulfate Inj 50%: INTRAMUSCULAR | Qty: 10 | Status: AC

## 2020-04-29 MED FILL — Sodium Chloride IV Soln 0.9%: INTRAVENOUS | Qty: 2000 | Status: AC

## 2020-04-29 MED FILL — Sodium Bicarbonate IV Soln 8.4%: INTRAVENOUS | Qty: 50 | Status: AC

## 2020-04-29 NOTE — Evaluation (Signed)
Physical Therapy Evaluation Patient Details Name: [name] MRN: [id number] DOB: 12-11-1955 Today's Date: 04/29/2020   History of Present Illness  65 y.o. female with a history of PMR,  HTN, and OA.  Presented with the diagnosis of abnormal ekg - chest pain - sob.  Underwent CORONARY ARTERY BYPASS GRAFTING (CABG) 2/3.  Patient had bilateral THA and prior L4-5 surgery in the remote past.  Clinical Impression  Pt fully participated in session. Pt was I with use of walking stick occasionally prior to surgery. Pt requiring min A for transfers and ambulation. Pt with limited ambulation tolerance due to SOB, but SpO2 remained appropriate on 4 L O2. Pt demonstrating deficits in balance, strength, coordination, endurance, safety and gait. Pt will benefit from skilled PT to address deficits and assess maintaining precautions to maximize independence with functional mobility prior to discharge     Follow Up Recommendations Home health PT vs Outpatient PT;Supervision/Assistance - 24 hour    Equipment Recommendations   (TBD)    Recommendations for Other Services       Precautions / Restrictions Precautions Precautions: Sternal Precaution Booklet Issued: No Restrictions Weight Bearing Restrictions: Yes RUE Weight Bearing: Non weight bearing LUE Weight Bearing: Non weight bearing Other Position/Activity Restrictions: education on sternal precautions. pt able to recall 2/3      Mobility  Bed Mobility Overal bed mobility: Needs Assistance Bed Mobility: Sidelying to Sit   Sidelying to sit: Min assist            Transfers Overall transfer level: Needs assistance   Transfers: Sit to/from Stand;Stand Pivot Transfers Sit to Stand: Min assist Stand pivot transfers: Min assist       General transfer comment: chest tube removed  Ambulation/Gait Ambulation/Gait assistance: +2 safety/equipment;Min assist Gait Distance (Feet): 80 Feet Assistive device:  (eva walker)       General  Gait Details: decreased velocity with mulitple standing rest breaks needed  Stairs            Wheelchair Mobility    Modified Rankin (Stroke Patients Only)       Balance Overall balance assessment: Needs assistance Sitting-balance support: Feet supported Sitting balance-Leahy Scale: Good     Standing balance support: No upper extremity supported Standing balance-Leahy Scale: Poor Standing balance comment: relies on external support.                             Pertinent Vitals/Pain Pain Assessment: Faces Pain Score: 6  Faces Pain Scale: Hurts little more Pain Location: back and chest Pain Descriptors / Indicators: Spasm Pain Intervention(s): Monitored during session    Home Living Family/patient expects to be discharged to:: Private residence Living Arrangements: Children Available Help at Discharge: Family;Available 24 hours/day Type of Home: House Home Access: Stairs to enter Entrance Stairs-Rails: None Entrance Stairs-Number of Steps: 2 to the front - low rise and longer run steps to the front door. Home Layout: Two level;Able to live on main level with bedroom/bathroom Home Equipment: Other (comment) (walking stick) Additional Comments: had a RW but doesn't know where it is    Prior Function Level of Independence: Independent with assistive device(s)         Comments: walking stick     Hand Dominance   Dominant Hand: Right    Extremity/Trunk Assessment   Upper Extremity Assessment Upper Extremity Assessment: Defer to OT evaluation    Lower Extremity Assessment Lower Extremity Assessment: Overall WFL for tasks  assessed    Cervical / Trunk Assessment Cervical / Trunk Assessment: Normal  Communication   Communication: No difficulties  Cognition Arousal/Alertness: Awake/alert Behavior During Therapy: WFL for tasks assessed/performed Overall Cognitive Status: Within Functional Limits for tasks assessed                                  General Comments: has high anxiety regarding pain/spasm to back      General Comments General comments (skin integrity, edema, etc.): VS: O2 96-99 on 4 L; HR 72-78; BP sitting: 91/59; initial stand 89/56; following 3 minuites 95/61; following activity 113/64    Exercises     Assessment/Plan    PT Assessment Patient needs continued PT services  PT Problem List Decreased mobility;Decreased coordination;Decreased activity tolerance;Decreased balance       PT Treatment Interventions Therapeutic exercise;Gait training;Balance training;Stair training;Functional mobility training;Therapeutic activities;Patient/family education    PT Goals (Current goals can be found in the Care Plan section)  Acute Rehab PT Goals Patient Stated Goal: To feel better and gain some confidence with moving PT Goal Formulation: With patient Potential to Achieve Goals: Good    Frequency Min 3X/week   Barriers to discharge        Co-evaluation               AM-PAC PT "6 Clicks" Mobility  Outcome Measure Help needed turning from your back to your side while in a flat bed without using bedrails?: A Little Help needed moving from lying on your back to sitting on the side of a flat bed without using bedrails?: A Lot Help needed moving to and from a bed to a chair (including a wheelchair)?: A Little Help needed standing up from a chair using your arms (e.g., wheelchair or bedside chair)?: A Little Help needed to walk in hospital room?: A Little Help needed climbing 3-5 steps with a railing? : A Lot 6 Click Score: 16    End of Session Equipment Utilized During Treatment: Gait belt;Oxygen Activity Tolerance: Patient tolerated treatment well;Patient limited by fatigue Patient left: in chair;with call bell/phone within reach Nurse Communication: Mobility status PT Visit Diagnosis: Unsteadiness on feet (R26.81);Pain;Other abnormalities of gait and mobility (R26.89) Pain - part of  body:  (back and chest)    Time: 9379-0240 PT Time Calculation (min) (ACUTE ONLY): 29 min   Charges:   PT Evaluation $PT Eval Low Complexity: 1 Low PT Treatments $Therapeutic Activity: 8-22 mins        Ginette Otto, DPT Acute Rehabilitation Services [phone]  Lucretia Field 04/29/2020, 1:40 PM

## 2020-04-29 NOTE — Evaluation (Signed)
Occupational Therapy Evaluation Patient Details Name: [name] MRN: [id number] DOB: 1956/03/17 Today's Date: 04/29/2020    History of Present Illness 65 y.o. female with a history of PMR,  HTN, and OA.  Presented with the diagnosis of abnormal ekg - chest pain - sob.  Underwent CORONARY ARTERY BYPASS GRAFTING (CABG) 2/3.  Patient had bilateral THA and prior L4-5 surgery in the remote past.   Clinical Impression   Patient admitted for the above diagnosis and procedure.  PTA she did use a RW for mobility due to chronic back pain, but was otherwise able to care for herself with respect to ADL/toileting.  She continues to participate with meal prep and home management.  Currently, due to deficits listed below, she is needing up to Min A for basic mobility, and up to Mod A for ADL in the acute setting.  Skilled OT indicated to maximize functional status, and reinforce sternal precautions prior to returning home with possibly HH.      Follow Up Recommendations  Home health OT    Equipment Recommendations  3 in 1 bedside commode;Tub/shower seat    Recommendations for Other Services       Precautions / Restrictions Precautions Precautions: Sternal Precaution Booklet Issued: No Restrictions Other Position/Activity Restrictions: educated regarding ADL in the tube, and pushing from her knees for sit/stand      Mobility Bed Mobility Overal bed mobility: Needs Assistance Bed Mobility: Sidelying to Sit   Sidelying to sit: Min assist            Transfers Overall transfer level: Needs assistance   Transfers: Sit to/from Stand;Stand Pivot Transfers Sit to Stand: Min assist Stand pivot transfers: Min assist       General transfer comment: chest tube removed    Balance Overall balance assessment: Needs assistance Sitting-balance support: Feet supported Sitting balance-Leahy Scale: Good     Standing balance support: Single extremity supported Standing balance-Leahy Scale:  Poor Standing balance comment: relies on external support.                           ADL either performed or assessed with clinical judgement   ADL Overall ADL's : Needs assistance/impaired Eating/Feeding: Independent;Sitting   Grooming: Wash/dry hands;Wash/dry face;Set up;Sitting           Upper Body Dressing : Moderate assistance;Sitting   Lower Body Dressing: Moderate assistance;Sit to/from stand               Functional mobility during ADLs: Minimal assistance General ADL Comments: HHA and gait belt     Vision Patient Visual Report: No change from baseline       Perception     Praxis      Pertinent Vitals/Pain Pain Assessment: Faces Faces Pain Scale: Hurts little more Pain Location: low back Pain Descriptors / Indicators: Spasm Pain Intervention(s): Monitored during session     Hand Dominance Right   Extremity/Trunk Assessment Upper Extremity Assessment Upper Extremity Assessment: Overall WFL for tasks assessed   Lower Extremity Assessment Lower Extremity Assessment: Defer to PT evaluation   Cervical / Trunk Assessment Cervical / Trunk Assessment: Normal   Communication Communication Communication: No difficulties   Cognition Arousal/Alertness: Awake/alert Behavior During Therapy: WFL for tasks assessed/performed Overall Cognitive Status: Within Functional Limits for tasks assessed  General Comments: has high anxiety regarding pain/spasm to back   General Comments   BP at conclusion 113/73 and O2 at 94% on 4L.      Exercises     Shoulder Instructions      Home Living Family/patient expects to be discharged to:: Private residence Living Arrangements: Children Available Help at Discharge: Family;Available 24 hours/day Type of Home: House Home Access: Stairs to enter [employer] of Steps: 2 to the front - low rise and longer run steps to the front door. Entrance  Stairs-Rails: None Home Layout: Two level;Able to live on main level with bedroom/bathroom     Bathroom Shower/Tub: Chief Strategy Officer: Standard     Home Equipment: Walker - 2 wheels          Prior Functioning/Environment Level of Independence: Independent with assistive device(s)        Comments: use of RW for mobility due to back and R ankle pain.        OT Problem List: Decreased activity tolerance;Impaired balance (sitting and/or standing);Decreased knowledge of precautions;Pain      OT Treatment/Interventions: Self-care/ADL training;Therapeutic exercise;Balance training;Therapeutic activities    OT Goals(Current goals can be found in the care plan section) Acute Rehab OT Goals Patient Stated Goal: To feel better and gain some confidence with moving OT Goal Formulation: With patient Time For Goal Achievement: 05/13/20 Potential to Achieve Goals: Good ADL Goals Pt Will Perform Lower Body Bathing: sit to/from stand;with set-up Pt Will Perform Upper Body Dressing: with set-up;sitting Pt Will Perform Lower Body Dressing: with set-up;sit to/from stand Pt Will Transfer to Toilet: with modified independence;ambulating;regular height toilet Pt Will Perform Toileting - Clothing Manipulation and hygiene: with modified independence;sit to/from stand  OT Frequency: Min 2X/week   Barriers to D/C:    none noted       Co-evaluation              AM-PAC OT "6 Clicks" Daily Activity     Outcome Measure Help from another person eating meals?: None Help from another person taking care of personal grooming?: A Little Help from another person toileting, which includes using toliet, bedpan, or urinal?: A Little Help from another person bathing (including washing, rinsing, drying)?: A Lot Help from another person to put on and taking off regular upper body clothing?: A Little Help from another person to put on and taking off regular lower body clothing?: A  Lot 6 Click Score: 17   End of Session Equipment Utilized During Treatment: Gait belt;Oxygen Nurse Communication: Mobility status  Activity Tolerance: Patient tolerated treatment well Patient left: in chair;with call bell/phone within reach  OT Visit Diagnosis: Unsteadiness on feet (R26.81);Pain Pain - Right/Left: Right Pain - part of body: Leg                Time: 5053-9767 OT Time Calculation (min): 21 min Charges:  OT General Charges $OT Visit: 1 Visit OT Evaluation $OT Eval Moderate Complexity: 1 Mod  04/29/2020  Rich, OTR/L  Acute Rehabilitation Services  Office:  [phone]   Suzanna Obey 04/29/2020, 11:59 AM

## 2020-04-29 NOTE — Progress Notes (Signed)
Pt refused morning weight and mobility. Pt stated "It is too early and I want to wait until the day to get up since I have not got m,much sleep." RN educated the risk and benefits.

## 2020-04-29 NOTE — Plan of Care (Signed)
  Problem: Education: Goal: Knowledge of General Education information will improve Description: Including pain rating scale, medication(s)/side effects and non-pharmacologic comfort measures Outcome: Progressing   Problem: Health Behavior/Discharge Planning: Goal: Ability to manage health-related needs will improve Outcome: Progressing   Problem: Clinical Measurements: Goal: Ability to maintain clinical measurements within normal limits will improve Outcome: Progressing Goal: Will remain free from infection Outcome: Progressing Goal: Diagnostic test results will improve Outcome: Progressing Goal: Respiratory complications will improve Outcome: Progressing Goal: Cardiovascular complication will be avoided Outcome: Progressing   Problem: Activity: Goal: Risk for activity intolerance will decrease Outcome: Progressing   Problem: Nutrition: Goal: Adequate nutrition will be maintained Outcome: Progressing   Problem: Coping: Goal: Level of anxiety will decrease Outcome: Progressing   Problem: Elimination: Goal: Will not experience complications related to bowel motility Outcome: Progressing Goal: Will not experience complications related to urinary retention Outcome: Progressing   Problem: Pain Managment: Goal: General experience of comfort will improve Outcome: Progressing   Problem: Safety: Goal: Ability to remain free from injury will improve Outcome: Progressing   Problem: Skin Integrity: Goal: Risk for impaired skin integrity will decrease Outcome: Progressing   Problem: Education: Goal: Understanding of CV disease, CV risk reduction, and recovery process will improve Outcome: Progressing Goal: Individualized Educational Video(s) Outcome: Progressing   Problem: Activity: Goal: Ability to return to baseline activity level will improve Outcome: Progressing   Problem: Cardiovascular: Goal: Ability to achieve and maintain adequate cardiovascular perfusion  will improve Outcome: Progressing Goal: Vascular access site(s) Level 0-1 will be maintained Outcome: Progressing   Problem: Health Behavior/Discharge Planning: Goal: Ability to safely manage health-related needs after discharge will improve Outcome: Progressing   Problem: Education: Goal: Will demonstrate proper wound care and an understanding of methods to prevent future damage Outcome: Progressing Goal: Knowledge of disease or condition will improve Outcome: Progressing Goal: Knowledge of the prescribed therapeutic regimen will improve Outcome: Progressing Goal: Individualized Educational Video(s) Outcome: Progressing   Problem: Activity: Goal: Risk for activity intolerance will decrease Outcome: Progressing   Problem: Cardiac: Goal: Will achieve and/or maintain hemodynamic stability Outcome: Progressing   Problem: Clinical Measurements: Goal: Postoperative complications will be avoided or minimized Outcome: Progressing   Problem: Respiratory: Goal: Respiratory status will improve Outcome: Progressing   Problem: Skin Integrity: Goal: Wound healing without signs and symptoms of infection Outcome: Progressing Goal: Risk for impaired skin integrity will decrease Outcome: Progressing   Problem: Urinary Elimination: Goal: Ability to achieve and maintain adequate renal perfusion and functioning will improve Outcome: Progressing   

## 2020-04-30 ENCOUNTER — Inpatient Hospital Stay: Payer: Self-pay

## 2020-04-30 LAB — BASIC METABOLIC PANEL
Anion gap: 7 (ref 5–15)
BUN: 18 mg/dL (ref 8–23)
CO2: 22 mmol/L (ref 22–32)
Calcium: 9 mg/dL (ref 8.9–10.3)
Chloride: 108 mmol/L (ref 98–111)
Creatinine, Ser: 0.93 mg/dL (ref 0.44–1.00)
GFR, Estimated: >60 mL/min (ref >60)
Glucose, Bld: 117 mg/dL — ABNORMAL HIGH (ref 70–99)
Potassium: 3.4 mmol/L — ABNORMAL LOW (ref 3.5–5.1)
Sodium: 137 mmol/L (ref 135–145)

## 2020-04-30 LAB — BPAM RBC
Blood Product Expiration Date: 202202202359
Blood Product Expiration Date: 202202212359
ISSUE DATE / TIME: 202202021041
ISSUE DATE / TIME: 202202021041
Unit Type and Rh: 7300
Unit Type and Rh: 7300

## 2020-04-30 LAB — TYPE AND SCREEN
ABO/RH(D): B POS
Antibody Screen: NEGATIVE
Unit division: 0
Unit division: 0

## 2020-04-30 LAB — CBC
HCT: 23.3 % — ABNORMAL LOW (ref 36.0–46.0)
Hemoglobin: 8.2 g/dL — ABNORMAL LOW (ref 12.0–15.0)
MCH: 31.8 pg (ref 26.0–34.0)
MCHC: 35.2 g/dL (ref 30.0–36.0)
MCV: 90.3 fL (ref 80.0–100.0)
Platelets: 148 10*3/uL — ABNORMAL LOW (ref 150–400)
RBC: 2.58 MIL/uL — ABNORMAL LOW (ref 3.87–5.11)
RDW: 14.4 % (ref 11.5–15.5)
WBC: 12.5 10*3/uL — ABNORMAL HIGH (ref 4.0–10.5)
nRBC: 0 % (ref 0.0–0.2)

## 2020-04-30 LAB — GLUCOSE, CAPILLARY
Glucose-Capillary: 112 mg/dL — ABNORMAL HIGH (ref 70–99)
Glucose-Capillary: 118 mg/dL — ABNORMAL HIGH (ref 70–99)

## 2020-04-30 MED ORDER — POTASSIUM CHLORIDE 20 MEQ PO PACK
20.0000 meq | PACK | Freq: Two times a day (BID) | ORAL | Status: DC
  Administered 2020-05-01 – 2020-05-02 (×4): 20 meq via ORAL
  Filled 2020-04-30 (×5): qty 1

## 2020-04-30 MED ORDER — AMIODARONE HCL 200 MG PO TABS
400.0000 mg | ORAL_TABLET | Freq: Two times a day (BID) | ORAL | Status: DC
  Administered 2020-04-30 – 2020-05-04 (×9): 400 mg via ORAL
  Filled 2020-04-30 (×9): qty 2

## 2020-04-30 MED ORDER — AMIODARONE IV BOLUS ONLY 150 MG/100ML
150.0000 mg | Freq: Once | INTRAVENOUS | Status: AC
  Administered 2020-04-30: 150 mg via INTRAVENOUS
  Filled 2020-04-30: qty 100

## 2020-04-30 MED ORDER — METOPROLOL TARTRATE 25 MG/10 ML ORAL SUSPENSION
12.5000 mg | Freq: Two times a day (BID) | ORAL | Status: DC
  Filled 2020-04-30 (×5): qty 5

## 2020-04-30 MED ORDER — SODIUM CHLORIDE 0.9% FLUSH: 10.0000 mL | INTRAVENOUS | Status: DC | PRN

## 2020-04-30 MED ORDER — POTASSIUM CHLORIDE CRYS ER 20 MEQ PO TBCR: 30.0000 meq | EXTENDED_RELEASE_TABLET | Freq: Two times a day (BID) | ORAL | Status: DC

## 2020-04-30 MED ORDER — METOPROLOL TARTRATE 25 MG PO TABS
25.0000 mg | ORAL_TABLET | Freq: Two times a day (BID) | ORAL | Status: DC
  Administered 2020-04-30 – 2020-05-02 (×5): 25 mg via ORAL
  Filled 2020-04-30 (×5): qty 1

## 2020-04-30 MED ORDER — POTASSIUM CHLORIDE 20 MEQ PO PACK
20.0000 meq | PACK | Freq: Three times a day (TID) | ORAL | Status: AC
  Administered 2020-04-30 (×3): 20 meq via ORAL
  Filled 2020-04-30 (×3): qty 1

## 2020-04-30 MED ORDER — POTASSIUM CHLORIDE CRYS ER 20 MEQ PO TBCR: 20.0000 meq | EXTENDED_RELEASE_TABLET | Freq: Two times a day (BID) | ORAL | Status: DC

## 2020-04-30 MED ORDER — ALBUMIN HUMAN 5 % IV SOLN
12.5000 g | Freq: Once | INTRAVENOUS | Status: AC
  Administered 2020-04-30: 12.5 g via INTRAVENOUS
  Filled 2020-04-30: qty 250

## 2020-04-30 MED ORDER — AMIODARONE LOAD VIA INFUSION
150.0000 mg | Freq: Once | INTRAVENOUS | Status: DC
  Filled 2020-04-30: qty 83.34

## 2020-04-30 MED ORDER — SODIUM CHLORIDE 0.9% FLUSH
10.0000 mL | Freq: Two times a day (BID) | INTRAVENOUS | Status: DC
  Administered 2020-04-30 – 2020-05-03 (×5): 10 mL
  Administered 2020-05-03: 20 mL
  Administered 2020-05-04: 10 mL

## 2020-04-30 NOTE — Progress Notes (Signed)
Pt HR Increased to 160 sustained. PRN metoprolol 2.5mg  IV given and 12 led EKG stated a flutter. Dr.Atkin notified and Amiodarone 150mg  IV bolus ordered . Will continue to monitor   , RN

## 2020-04-30 NOTE — Progress Notes (Signed)
Peripherally Inserted Central Catheter Placement  The IV Nurse has discussed with the patient and/or persons authorized to consent for the patient, the purpose of this procedure and the potential benefits and risks involved with this procedure.  The benefits include less needle sticks, lab draws from the catheter, and the patient may be discharged home with the catheter. Risks include, but not limited to, infection, bleeding, blood clot (thrombus formation), and puncture of an artery; nerve damage and irregular heartbeat and possibility to perform a PICC exchange if needed/ordered by physician.  Alternatives to this procedure were also discussed.  Bard Power PICC patient education guide, fact sheet on infection prevention and patient information card has been provided to patient /or left at bedside.    PICC Placement Documentation  PICC Double Lumen 04/30/20 PICC Right Brachial 32 cm 0 cm (Active)  Indication for Insertion or Continuance of Line Vasoactive infusions 04/30/20 1608  Exposed Catheter (cm) 0 cm 04/30/20 1608  Site Assessment Clean;Dry;Intact 04/30/20 1608  Lumen #1 Status Flushed;Saline locked;Blood return noted 04/30/20 1608  Lumen #2 Status Flushed;Saline locked;Blood return noted 04/30/20 1608  Dressing Type Transparent 04/30/20 1608  Dressing Status Dry;Intact;Clean 04/30/20 1608  Antimicrobial disc in place? Yes 04/30/20 1608  Dressing Intervention New dressing 04/30/20 1608  Dressing Change Due 05/07/20 04/30/20 1608       Ethelda Chick 04/30/2020, 4:10 PM

## 2020-04-30 NOTE — Progress Notes (Signed)
PT Cancellation Note  Patient Details Name: [name] MRN: [id number] DOB: Sep 05, 1955   Cancelled Treatment:    Reason Eval/Treat Not Completed: Medical issues which prohibited therapy migraine, will attempt in PM  Ginette Otto, DPT Acute Rehabilitation Services [phone]   Lucretia Field 04/30/2020, 10:27 AM

## 2020-04-30 NOTE — Progress Notes (Signed)
Pt arrived to the floor alert and oriented with the RN. CHG bath given, cardiac monitoring initiated. Pt oriented to room and equipment. We'll continue to monitor.

## 2020-04-30 NOTE — Progress Notes (Signed)
Physical Therapy Treatment Patient Details Name: [name] MRN: [id number] DOB: 1955-11-24 Today's Date: 04/30/2020    History of Present Illness 65 y.o. female with a history of PMR,  HTN, and OA.  Presented with the diagnosis of abnormal ekg - chest pain - sob.  Underwent CORONARY ARTERY BYPASS GRAFTING (CABG) 2/3.  Patient had bilateral THA and prior L4-5 surgery in the remote past.    PT Comments    Pt with limited participation due to migraine and need for PICC line. Pt motivated to perform any therapy. HEP bed level exercises given, pt performed and verbalized understanding. Pt continues to be limited by strength, endurance, gait, safety, endurance and pain and will benefit from skilled PT to address deficits to maximize independence with functional mobility prior to discharge.     Follow Up Recommendations  Home health PT;Outpatient PT;Supervision/Assistance - 24 hour     Equipment Recommendations  3in1 (PT);Rolling walker with 5" wheels    Recommendations for Other Services       Precautions / Restrictions Restrictions Weight Bearing Restrictions: No    Mobility  Bed Mobility                  Transfers                    Ambulation/Gait                 Stairs             Wheelchair Mobility    Modified Rankin (Stroke Patients Only)       Balance                                            Cognition                                              Exercises General Exercises - Lower Extremity Ankle Circles/Pumps: AROM;Both;Supine;10 reps Quad Sets: AROM;Both;Supine;10 reps Gluteal Sets: AROM;Both;10 reps;Supine Heel Slides: AROM;Both;10 reps;Supine Hip ABduction/ADduction: AROM;Both;10 reps;Supine Straight Leg Raises: AROM;Both;10 reps;Supine    General Comments General comments (skin integrity, edema, etc.): PICC line ordered so therapy remained in bed      Pertinent Vitals/Pain       Home Living                      Prior Function            PT Goals (current goals can now be found in the care plan section) Acute Rehab PT Goals Patient Stated Goal: To feel better and gain some confidence with moving PT Goal Formulation: With patient Potential to Achieve Goals: Good Progress towards PT goals: Progressing toward goals    Frequency    Min 3X/week      PT Plan Current plan remains appropriate    Co-evaluation              AM-PAC PT "6 Clicks" Mobility   Outcome Measure  Help needed turning from your back to your side while in a flat bed without using bedrails?: A Little Help needed moving from lying on your back to sitting on the side of a flat bed without using bedrails?: A Lot Help needed  moving to and from a bed to a chair (including a wheelchair)?: A Little Help needed standing up from a chair using your arms (e.g., wheelchair or bedside chair)?: A Little Help needed to walk in hospital room?: A Little Help needed climbing 3-5 steps with a railing? : A Lot 6 Click Score: 16    End of Session Equipment Utilized During Treatment: Oxygen Activity Tolerance: Patient tolerated treatment well;Patient limited by fatigue;Treatment limited secondary to medical complications (Comment) (pt with migraine and now needing PICC line) Patient left: in bed;with call bell/phone within reach Nurse Communication: Mobility status PT Visit Diagnosis: Unsteadiness on feet (R26.81);Pain;Other abnormalities of gait and mobility (R26.89) Pain - part of body:  (back and chest)     Time: 1140-1200 PT Time Calculation (min) (ACUTE ONLY): 20 min  Charges:  $Therapeutic Exercise: 8-22 mins                     Ginette Otto, DPT Acute Rehabilitation Services [phone]   [name] 04/30/2020, 1:00 PM

## 2020-04-30 NOTE — Progress Notes (Addendum)
      [address]       [place] [address]             [phone]      3 Days Post-Op Procedure(s) (LRB): CORONARY ARTERY BYPASS GRAFTING (CABG) TIMES TWO USING BILATERAL INTERNAL MAMMARY ARTERIES (N/A) TRANSESOPHAGEAL ECHOCARDIOGRAM (TEE) (N/A) INDOCYANINE GREEN FLUORESCENCE IMAGING (ICG) (N/A) Subjective: She feels okay this morning, fluttering in her chest from the afib.  Objective: Vital signs in last 24 hours: Temp:  [98.2 F (36.8 C)-99 F (37.2 C)] 98.4 F (36.9 C) (02/05 0631) Pulse Rate:  [54-134] 91 (02/05 0631) Cardiac Rhythm: Atrial paced (02/05 0131) Resp:  [18-28] 22 (02/05 0631) BP: (89-146)/(49-91) 122/81 (02/05 0631) SpO2:  [91 %-100 %] 97 % (02/05 0631)     Intake/Output from previous day: 02/04 0701 - 02/05 0700 In: 678.6 [I.V.:678.6] Out: 2590 [Urine:2410; Chest Tube:180] Intake/Output this shift: No intake/output data recorded.  General appearance: alert, cooperative and no distress Heart: irregularly irregular rhythm Lungs: clear to auscultation bilaterally Abdomen: soft, non-tender; bowel sounds normal; no masses,  no organomegaly Extremities: 2+ edema in upper and lower ext Wound: clean and dry covered with prevena  Lab Results: Recent Labs    04/29/20 0340 04/30/20 0127  WBC 12.5* 12.5*  HGB 7.9* 8.2*  HCT 23.3* 23.3*  PLT 134* 148*   BMET:  Recent Labs    04/29/20 0340 04/30/20 0127  NA 138 137  K 3.6 3.4*  CL 107 108  CO2 23 22  GLUCOSE 94 117*  BUN 11 18  CREATININE 0.79 0.93  CALCIUM 9.1 9.0    PT/INR:  Recent Labs    04/27/20 1333  LABPROT 16.6*  INR 1.4*   ABG    Component Value Date/Time   PHART 7.341 (L) 04/27/2020 2231   HCO3 23.3 04/27/2020 2231   TCO2 25 04/27/2020 2231   ACIDBASEDEF 2.0 04/27/2020 2231   O2SAT 97.0 04/27/2020 2231   CBG (last 3)  Recent Labs    04/29/20 1521 04/29/20 2157 04/30/20 0619  GLUCAP 122* 174* 112*    Assessment/Plan: S/P Procedure(s)  (LRB): CORONARY ARTERY BYPASS GRAFTING (CABG) TIMES TWO USING BILATERAL INTERNAL MAMMARY ARTERIES (N/A) TRANSESOPHAGEAL ECHOCARDIOGRAM (TEE) (N/A) INDOCYANINE GREEN FLUORESCENCE IMAGING (ICG) (N/A)  1. CV-Afib 110s, Pacer set to 90. On Amio gtt but turned off currently due to IV issues. BP well controlled. Continue asa, Amio, lopressor and statin 2. Pulm- tolerating 3L [place] with good oxygen saturation. 3. Renal-creatine 0.93, potassium 3.4, replace and add daily supplementation since she is getting IV lasix BID. Weight continues to trend down. 4. H and H 8.2/23.3, stable 5. Endo-blood glucose well controlled  Plan: Place better IV and continue Amio gtt. Replace potassium. Continue diuretics for fluid overload. Encouraged ambulation and use of incentive spirometer. Weaning oxygen as able. Turn pacer off and roll wires, tape to skin.    LOS: 5 days    Sharlene Dory 04/30/2020

## 2020-04-30 NOTE — Progress Notes (Signed)
Mobility Specialist - Progress Note   04/30/20 1519  Mobility  Activity  (Cancel)   Pt currently having PICC placed, will f/u as able.  Mamie Levers Mobility Specialist Mobility Specialist Phone: [phone]

## 2020-05-01 LAB — BASIC METABOLIC PANEL
Anion gap: 8 (ref 5–15)
BUN: 15 mg/dL (ref 8–23)
CO2: 24 mmol/L (ref 22–32)
Calcium: 9.4 mg/dL (ref 8.9–10.3)
Chloride: 107 mmol/L (ref 98–111)
Creatinine, Ser: 0.84 mg/dL (ref 0.44–1.00)
GFR, Estimated: >60 mL/min (ref >60)
Glucose, Bld: 103 mg/dL — ABNORMAL HIGH (ref 70–99)
Potassium: 4.1 mmol/L (ref 3.5–5.1)
Sodium: 139 mmol/L (ref 135–145)

## 2020-05-01 LAB — GLUCOSE, CAPILLARY: Glucose-Capillary: 121 mg/dL — ABNORMAL HIGH (ref 70–99)

## 2020-05-01 MED ORDER — MENTHOL 3 MG MT LOZG
1.0000 | LOZENGE | OROMUCOSAL | Status: DC | PRN
  Administered 2020-05-01 (×2): 3 mg via ORAL
  Filled 2020-05-01 (×2): qty 9

## 2020-05-01 MED ORDER — ASPIRIN EC 81 MG PO TBEC
81.0000 mg | DELAYED_RELEASE_TABLET | Freq: Every day | ORAL | Status: DC
  Administered 2020-05-02 – 2020-05-04 (×3): 81 mg via ORAL
  Filled 2020-05-01 (×3): qty 1

## 2020-05-01 MED ORDER — APIXABAN 5 MG PO TABS
5.0000 mg | ORAL_TABLET | Freq: Two times a day (BID) | ORAL | Status: DC
  Administered 2020-05-01 – 2020-05-04 (×6): 5 mg via ORAL
  Filled 2020-05-01 (×6): qty 1

## 2020-05-01 MED ORDER — ALBUMIN HUMAN 5 % IV SOLN
12.5000 g | Freq: Once | INTRAVENOUS | Status: AC
  Administered 2020-05-01: 12.5 g via INTRAVENOUS
  Filled 2020-05-01: qty 250

## 2020-05-01 NOTE — Progress Notes (Signed)
Pacing wires removed. Pt tolerated well. Pt educated on 1 hour bedrest. Will continue to monitor .  Everlean Cherry, RN

## 2020-05-01 NOTE — Progress Notes (Signed)
Mobility Specialist - Progress Note   05/01/20 1350  Mobility  Activity Ambulated in hall  Level of Assistance Minimal assist, patient does 75% or more  Assistive Device Front wheel walker  Distance Ambulated (ft) 70 ft  Mobility Response Tolerated well  Mobility performed by Mobility specialist  $Mobility charge 1 Mobility   Pre-mobility: 76 HR, 107/60 BP, 94% SpO2 During mobility: 90 HR, 95% SpO2 Post-mobility: 85 HR, 114/73 BP, 97% SpO2  Pt required 2 standing rest breaks due to SOB. She was on 3L O2 throughout. No assist to get out of bed, min assist to elevate legs when getting back into bed.   Mamie Levers Mobility Specialist Mobility Specialist Phone: [phone]

## 2020-05-01 NOTE — Progress Notes (Signed)
      [address]       [place] [address]             [phone]      4 Days Post-Op Procedure(s) (LRB): CORONARY ARTERY BYPASS GRAFTING (CABG) TIMES TWO USING BILATERAL INTERNAL MAMMARY ARTERIES (N/A) TRANSESOPHAGEAL ECHOCARDIOGRAM (TEE) (N/A) INDOCYANINE GREEN FLUORESCENCE IMAGING (ICG) (N/A) Subjective: Symptomatic with afib w/ RVR last night. Throat is dry this morning.   Objective: Vital signs in last 24 hours: Temp:  [98.2 F (36.8 C)-98.8 F (37.1 C)] 98.2 F (36.8 C) (02/06 0500) Pulse Rate:  [69-149] 69 (02/06 0500) Cardiac Rhythm: Normal sinus rhythm (02/05 1936) Resp:  [16-148] 20 (02/06 0500) BP: (91-125)/(51-97) 119/65 (02/06 0500) SpO2:  [93 %-100 %] 97 % (02/06 0500) FiO2 (%):  [32 %] 32 % (02/05 0838) Weight:  [79.2 kg] 79.2 kg (02/06 0358)     Intake/Output from previous day: 02/05 0701 - 02/06 0700 In: 210 [P.O.:110; I.V.:100] Out: 1185 [Urine:1185] Intake/Output this shift: No intake/output data recorded.  General appearance: alert, cooperative and no distress Heart: regular rate and rhythm, S1, S2 normal, no murmur, click, rub or gallop Lungs: clear to auscultation bilaterally Abdomen: soft, non-tender; bowel sounds normal; no masses,  no organomegaly Extremities: 1+ edema upper and lower ext Wound: clean and dry, prevena  Lab Results: Recent Labs    04/29/20 0340 04/30/20 0127  WBC 12.5* 12.5*  HGB 7.9* 8.2*  HCT 23.3* 23.3*  PLT 134* 148*   BMET:  Recent Labs    04/29/20 0340 04/30/20 0127  NA 138 137  K 3.6 3.4*  CL 107 108  CO2 23 22  GLUCOSE 94 117*  BUN 11 18  CREATININE 0.79 0.93  CALCIUM 9.1 9.0    PT/INR: No results for input(s): LABPROT, INR in the last 72 hours. ABG    Component Value Date/Time   PHART 7.341 (L) 04/27/2020 2231   HCO3 23.3 04/27/2020 2231   TCO2 25 04/27/2020 2231   ACIDBASEDEF 2.0 04/27/2020 2231   O2SAT 97.0 04/27/2020 2231   CBG (last 3)  Recent Labs     04/29/20 2157 04/30/20 0619 04/30/20 1152  GLUCAP 174* 112* 118*    Assessment/Plan: S/P Procedure(s) (LRB): CORONARY ARTERY BYPASS GRAFTING (CABG) TIMES TWO USING BILATERAL INTERNAL MAMMARY ARTERIES (N/A) TRANSESOPHAGEAL ECHOCARDIOGRAM (TEE) (N/A) INDOCYANINE GREEN FLUORESCENCE IMAGING (ICG) (N/A)  1. CV-Afib with RVR last night, now NSR in the 70s. Amio, lopressor and statin 2. Pulm- tolerating 2L [place] with good oxygen saturation. 3. Renal-creatine 0.93, potassium 3.4, replace and add daily supplementation since she is getting IV lasix BID. Weight continues to trend down. 4. H and H 8.2/23.3, stable 5. Endo-blood glucose well controlled  Plan: Will check another BMP this morning to check her electrolytes. Continue diuretics for fluid overload. She may need anticoagulation at this point for her rhythm. EPW still in place so will need to remove prior to starting anticoagulation.   LOS: 6 days    Sharlene Dory 05/01/2020

## 2020-05-01 NOTE — Progress Notes (Signed)
Physical Therapy Treatment Patient Details Name: [name] MRN: [id number] DOB: 09/13/1955 Today's Date: 05/01/2020    History of Present Illness 65 y.o. female with a history of PMR,  HTN, and OA.  Presented with the diagnosis of abnormal ekg - chest pain - sob.  Underwent CORONARY ARTERY BYPASS GRAFTING (CABG) 2/3.  Patient had bilateral THA and prior L4-5 surgery in the remote past.    PT Comments    Pt making significant progress towards her goals, ambulating an increased distance of up to ~200 ft with a RW and supervision this date. This does allow her to perform household mobility and short community distances. However, she easily becomes fatigued and SOB and needs standing rest breaks often to allow her to increase her mobility distance. Will continue to follow acutely. Current recommendations remain appropriate.   Follow Up Recommendations  Home health PT;Outpatient PT;Supervision/Assistance - 24 hour     Equipment Recommendations  3in1 (PT);Rolling walker with 5" wheels    Recommendations for Other Services       Precautions / Restrictions Precautions Precautions: Sternal Precaution Booklet Issued: No Precaution Comments: wound vac Restrictions Weight Bearing Restrictions: No RUE Weight Bearing: Non weight bearing LUE Weight Bearing: Non weight bearing Other Position/Activity Restrictions: Educated on sternal precautions.    Mobility  Bed Mobility Overal bed mobility: Needs Assistance Bed Mobility: Sit to Supine       Sit to supine: Min assist   General bed mobility comments: MinA to bring legs onto bed.  Transfers Overall transfer level: Needs assistance Equipment used: Rolling walker (2 wheeled) Transfers: Sit to/from Stand Sit to Stand: Min guard         General transfer comment: Extra time and cues to place hands on lap to maintain spinal precautions, min guard for safety. No overt LOB.  Ambulation/Gait Ambulation/Gait assistance:  Supervision Gait Distance (Feet): 200 Feet Assistive device: Rolling walker (2 wheeled) Gait Pattern/deviations: Step-through pattern;Narrow base of support;Decreased stride length Gait velocity: reduced Gait velocity interpretation: <1.31 ft/sec, indicative of household ambulator General Gait Details: Very slow gait with mild instability. Taking standing rest breaks about every 15-20 ft, cuing pt to take rest breaks to allow for increased gait distance. Narrow BOS and cued to widen for more stability, success. Intermittent toe drag with increased distance, cued pt to correct and be aware of clearing feet with changes in surfaces at home.   Stairs             Wheelchair Mobility    Modified Rankin (Stroke Patients Only)       Balance Overall balance assessment: Needs assistance Sitting-balance support: Feet supported Sitting balance-Leahy Scale: Good Sitting balance - Comments: No LOB sitting EOB upon arrival.   Standing balance support: Bilateral upper extremity supported Standing balance-Leahy Scale: Poor Standing balance comment: UE support on RW.                            Cognition Arousal/Alertness: Awake/alert Behavior During Therapy: WFL for tasks assessed/performed Overall Cognitive Status: Within Functional Limits for tasks assessed                                        Exercises      General Comments        Pertinent Vitals/Pain Pain Assessment: Faces Faces Pain Scale: Hurts little more Pain Location: chest  Pain Descriptors / Indicators: Discomfort Pain Intervention(s): Limited activity within patient's tolerance;Monitored during session;Repositioned;Premedicated before session    Home Living                      Prior Function            PT Goals (current goals can now be found in the care plan section) Acute Rehab PT Goals Patient Stated Goal: to get better PT Goal Formulation: With patient Potential  to Achieve Goals: Good Progress towards PT goals: Progressing toward goals    Frequency    Min 3X/week      PT Plan Current plan remains appropriate    Co-evaluation              AM-PAC PT "6 Clicks" Mobility   Outcome Measure  Help needed turning from your back to your side while in a flat bed without using bedrails?: A Little Help needed moving from lying on your back to sitting on the side of a flat bed without using bedrails?: A Lot Help needed moving to and from a bed to a chair (including a wheelchair)?: A Little Help needed standing up from a chair using your arms (e.g., wheelchair or bedside chair)?: A Little Help needed to walk in hospital room?: A Little Help needed climbing 3-5 steps with a railing? : A Lot 6 Click Score: 16    End of Session Equipment Utilized During Treatment: Oxygen;Gait belt Activity Tolerance: Patient tolerated treatment well Patient left: in bed;with call bell/phone within reach;with bed alarm set Nurse Communication: Mobility status;Patient requests pain meds PT Visit Diagnosis: Unsteadiness on feet (R26.81);Pain;Other abnormalities of gait and mobility (R26.89);Difficulty in walking, not elsewhere classified (R26.2) Pain - Right/Left:  (chest) Pain - part of body:  (chest)     Time: 4967-5916 PT Time Calculation (min) (ACUTE ONLY): 34 min  Charges:  $Gait Training: 23-37 mins                     Raymond Gurney, PT, DPT Acute Rehabilitation Services  Pager: [phone] Office: [phone]    Jewel Baize 05/01/2020, 4:49 PM

## 2020-05-02 LAB — GLUCOSE, CAPILLARY
Glucose-Capillary: 112 mg/dL — ABNORMAL HIGH (ref 70–99)
Glucose-Capillary: 158 mg/dL — ABNORMAL HIGH (ref 70–99)
Glucose-Capillary: 90 mg/dL (ref 70–99)

## 2020-05-02 MED ORDER — AMIODARONE IV BOLUS ONLY 150 MG/100ML: 150.0000 mg | Freq: Once | INTRAVENOUS | Status: DC

## 2020-05-02 MED ORDER — ACETAMINOPHEN 500 MG PO TABS: 1000.0000 mg | ORAL_TABLET | Freq: Four times a day (QID) | ORAL | 0 refills | Status: DC | PRN

## 2020-05-02 MED ORDER — AMIODARONE IV BOLUS ONLY 150 MG/100ML
150.0000 mg | Freq: Once | INTRAVENOUS | Status: AC
  Administered 2020-05-02: 150 mg via INTRAVENOUS
  Filled 2020-05-02: qty 100

## 2020-05-02 MED ORDER — COLCHICINE 0.6 MG PO TABS: 0.3000 mg | ORAL_TABLET | Freq: Two times a day (BID) | ORAL | 0 refills | Status: DC

## 2020-05-02 MED ORDER — POTASSIUM CHLORIDE 20 MEQ PO PACK: 20.0000 meq | PACK | Freq: Every day | ORAL | 0 refills | Status: DC

## 2020-05-02 MED ORDER — AMIODARONE HCL 200 MG PO TABS: 400.0000 mg | ORAL_TABLET | Freq: Two times a day (BID) | ORAL | 3 refills | Status: DC

## 2020-05-02 MED ORDER — METOPROLOL TARTRATE 25 MG/10 ML ORAL SUSPENSION
25.0000 mg | Freq: Two times a day (BID) | ORAL | Status: DC
  Filled 2020-05-02 (×2): qty 10

## 2020-05-02 MED ORDER — APIXABAN 5 MG PO TABS: 5.0000 mg | ORAL_TABLET | Freq: Two times a day (BID) | ORAL | 3 refills | Status: DC

## 2020-05-02 MED ORDER — LEVALBUTEROL HCL 0.63 MG/3ML IN NEBU: 0.6300 mg | INHALATION_SOLUTION | Freq: Three times a day (TID) | RESPIRATORY_TRACT | Status: DC | PRN

## 2020-05-02 MED ORDER — METOPROLOL TARTRATE 25 MG PO TABS: 25.0000 mg | ORAL_TABLET | Freq: Two times a day (BID) | ORAL | 3 refills | Status: DC

## 2020-05-02 MED ORDER — METOPROLOL TARTRATE 25 MG PO TABS
25.0000 mg | ORAL_TABLET | Freq: Two times a day (BID) | ORAL | Status: DC
  Administered 2020-05-02 – 2020-05-03 (×2): 25 mg via ORAL
  Filled 2020-05-02 (×2): qty 1

## 2020-05-02 MED ORDER — ROSUVASTATIN CALCIUM 20 MG PO TABS: 20.0000 mg | ORAL_TABLET | Freq: Every day | ORAL | 3 refills | Status: DC

## 2020-05-02 MED ORDER — FUROSEMIDE 40 MG PO TABS: 40.0000 mg | ORAL_TABLET | Freq: Every day | ORAL | 0 refills | Status: DC

## 2020-05-02 MED ORDER — AMIODARONE IV BOLUS ONLY 150 MG/100ML
150.0000 mg | Freq: Once | INTRAVENOUS | Status: AC
  Administered 2020-05-02: 150 mg via INTRAVENOUS
  Filled 2020-05-02 (×2): qty 100

## 2020-05-02 NOTE — Progress Notes (Signed)
      [address]       [place] [address]             [phone]      5 Days Post-Op Procedure(s) (LRB): CORONARY ARTERY BYPASS GRAFTING (CABG) TIMES TWO USING BILATERAL INTERNAL MAMMARY ARTERIES (N/A) TRANSESOPHAGEAL ECHOCARDIOGRAM (TEE) (N/A) INDOCYANINE GREEN FLUORESCENCE IMAGING (ICG) (N/A)   Subjective:  No new complaints.  Feeling pretty good and is ready to go home.  Off oxygen, +ambulation  Objective: Vital signs in last 24 hours: Temp:  [97.8 F (36.6 C)-98.9 F (37.2 C)] 98.3 F (36.8 C) (02/07 0531) Pulse Rate:  [69-85] 73 (02/07 0531) Cardiac Rhythm: Normal sinus rhythm (02/06 2100) Resp:  [13-25] 20 (02/07 0531) BP: (102-129)/(57-76) 118/57 (02/07 0531) SpO2:  [91 %-100 %] 100 % (02/07 0531) FiO2 (%):  [32 %] 32 % (02/06 0857) Weight:  [73.9 kg] 73.9 kg (02/07 0534)  Intake/Output from previous day: 02/06 0701 - 02/07 0700 In: 553.5 [P.O.:480; IV Piggyback:73.5] Out: 1590 [Urine:1590]  General appearance: alert, cooperative and no distress Heart: regular rate and rhythm Lungs: clear to auscultation bilaterally Abdomen: soft, non-tender; bowel sounds normal; no masses,  no organomegaly Extremities: extremities normal, atraumatic, no cyanosis or edema Wound: clean and dry  Lab Results: Recent Labs    04/30/20 0127  WBC 12.5*  HGB 8.2*  HCT 23.3*  PLT 148*   BMET:  Recent Labs    04/30/20 0127 05/01/20 0823  NA 137 139  K 3.4* 4.1  CL 108 107  CO2 22 24  GLUCOSE 117* 103*  BUN 18 15  CREATININE 0.93 0.84  CALCIUM 9.0 9.4    PT/INR: No results for input(s): LABPROT, INR in the last 72 hours. ABG    Component Value Date/Time   PHART 7.341 (L) 04/27/2020 2231   HCO3 23.3 04/27/2020 2231   TCO2 25 04/27/2020 2231   ACIDBASEDEF 2.0 04/27/2020 2231   O2SAT 97.0 04/27/2020 2231   CBG (last 3)  Recent Labs    04/30/20 1152 05/01/20 2122 05/02/20 0554  GLUCAP 118* 121* 112*    Assessment/Plan: S/P Procedure(s)  (LRB): CORONARY ARTERY BYPASS GRAFTING (CABG) TIMES TWO USING BILATERAL INTERNAL MAMMARY ARTERIES (N/A) TRANSESOPHAGEAL ECHOCARDIOGRAM (TEE) (N/A) INDOCYANINE GREEN FLUORESCENCE IMAGING (ICG) (N/A)  1. CV- PAF, in NSR- continue AMiodarone, Lopressor, Eliquis 2. Pulm- no acute issues, off oxygen, cntinue IS 3. Renal- creatinine WNL, weight is trending down, will taper lasix off over next week 4. Deconditioning- home PT orders placed, DME orders placed 5. Dispo- patient stable, in NSR continue current medications, will d/c home otday   LOS: 7 days    Lowella Dandy, PA-C 05/02/2020

## 2020-05-02 NOTE — Discharge Instructions (Addendum)
If you developed Atrial Fibrillation that is fast rate, please call your Cardiologist Office at [phone]   Discharge Instructions:  1. You may shower, please wash incisions daily with soap and water and keep dry.  If you wish to cover wounds with dressing you may do so but please keep clean and change daily.  No tub baths or swimming until incisions have completely healed.  If your incisions become red or develop any drainage please call our office at [phone]  2. No Driving until cleared by Dr. Kathrynn Humble office and you are no longer using narcotic pain medications  3. Monitor your weight daily.. Please use the same scale and weigh at same time... If you gain 5-10 lbs in 48 hours with associated lower extremity swelling, please contact our office at [phone]  4. Fever of 101.5 for at least 24 hours with no source, please contact our office at [phone]  5. Activity- up as tolerated, please walk at least 3 times per day.  Avoid strenuous activity, no lifting, pushing, or pulling with your arms over 8-10 lbs for a minimum of 6 weeks  6. If any questions or concerns arise, please do not hesitate to contact our office at [phone]   =====================================================   Information on my medicine - ELIQUIS (apixaban)  This medication education was reviewed with me or my healthcare representative as part of my discharge preparation.    Why was Eliquis prescribed for you? Eliquis was prescribed for you to reduce the risk of a blood clot forming that can cause a stroke if you have a medical condition called atrial fibrillation (a type of irregular heartbeat).  What do You need to know about Eliquis ? Take your Eliquis TWICE DAILY - one tablet in the morning and one tablet in the evening with or without food. If you have difficulty swallowing the tablet whole please discuss with your pharmacist how to take the medication safely.  Take Eliquis exactly as  prescribed by your doctor and DO NOT stop taking Eliquis without talking to the doctor who prescribed the medication.  Stopping may increase your risk of developing a stroke.  Refill your prescription before you run out.  After discharge, you should have regular check-up appointments with your healthcare provider that is prescribing your Eliquis.  In the future your dose may need to be changed if your kidney function or weight changes by a significant amount or as you get older.  What do you do if you miss a dose? If you miss a dose, take it as soon as you remember on the same day and resume taking twice daily.  Do not take more than one dose of ELIQUIS at the same time to make up a missed dose.  Important Safety Information A possible side effect of Eliquis is bleeding. You should call your healthcare provider right away if you experience any of the following: ? Bleeding from an injury or your nose that does not stop. ? Unusual colored urine (red or dark brown) or unusual colored stools (red or black). ? Unusual bruising for unknown reasons. ? A serious fall or if you hit your head (even if there is no bleeding).  Some medicines may interact with Eliquis and might increase your risk of bleeding or clotting while on Eliquis. To help avoid this, consult your healthcare provider or pharmacist prior to using any new prescription or non-prescription medications, including herbals, vitamins, non-steroidal anti-inflammatory drugs (NSAIDs) and supplements.  This website has more information on  Eliquis (apixaban): http://www.eliquis.com/eliquis/home =======================================================  Atrial Fibrillation    Atrial fibrillation is a type of heartbeat that is irregular or fast. If you have this condition, your heart beats without any order. This makes it hard for your heart to pump blood in a normal way. Atrial fibrillation may come and go, or it may become a long-lasting  problem. If this condition is not treated, it can put you at higher risk for stroke, heart failure, and other heart problems.  What are the causes? This condition may be caused by diseases that damage the heart. They include:  High blood pressure.  Heart failure.  Heart valve disease.  Heart surgery. Other causes include:  Diabetes.  Thyroid disease.  Being overweight.  Kidney disease. Sometimes the cause is not known.  What increases the risk? You are more likely to develop this condition if:  You are older.  You smoke.  You exercise often and very hard.  You have a family history of this condition.  You are a man.  You use drugs.  You drink a lot of alcohol.  You have lung conditions, such as emphysema, pneumonia, or COPD.  You have sleep apnea.   What are the signs or symptoms? Common symptoms of this condition include:  A feeling that your heart is beating very fast.  Chest pain or discomfort.  Feeling short of breath.  Suddenly feeling light-headed or weak.  Getting tired easily during activity.  Fainting.  Sweating. In some cases, there are no symptoms.  How is this treated? Treatment for this condition depends on underlying conditions and how you feel when you have atrial fibrillation. They include: 1. Medicines to: ? Prevent blood clots. ? Treat heart rate or heart rhythm problems. 2. Using devices, such as a pacemaker, to correct heart rhythm problems. 3. Doing surgery to remove the part of the heart that sends bad signals. 4. Closing an area where clots can form in the heart (left atrial appendage). In some cases, your doctor will treat other underlying conditions.  Follow these instructions at home:  Medicines 1. Take over-the-counter and prescription medicines only as told by your doctor. 2. Do not take any new medicines without first talking to your doctor. 3. If you are taking blood thinners: ? Talk with your doctor before  you take any medicines that have aspirin or NSAIDs, such as ibuprofen, in them. ? Take your medicine exactly as told by your doctor. Take it at the same time each day. ? Avoid activities that could hurt or bruise you. Follow instructions about how to prevent falls. ? Wear a bracelet that says you are taking blood thinners. Or, carry a card that lists what medicines you take. Lifestyle          Do not use any products that have nicotine or tobacco in them. These include cigarettes, e-cigarettes, and chewing tobacco. If you need help quitting, ask your doctor.  Eat heart-healthy foods. Talk with your doctor about the right eating plan for you.  Exercise regularly as told by your doctor.  Do not drink alcohol.  Lose weight if you are overweight.  Do not use drugs, including cannabis.  General instructions  If you have a condition that causes breathing to stop for a short period of time (apnea), treat it as told by your doctor.  Keep a healthy weight. Do not use diet pills unless your doctor says they are safe for you. Diet pills may make heart problems worse.  Keep  all follow-up visits as told by your doctor. This is important.  Contact a doctor if:  You notice a change in the speed, rhythm, or strength of your heartbeat.  You are taking a blood-thinning medicine and you get more bruising.  You get tired more easily when you move or exercise.  You have a sudden change in weight.  Get help right away if:    1. You have pain in your chest or your belly (abdomen). 2. You have trouble breathing. 3. You have side effects of blood thinners, such as blood in your vomit, poop (stool), or pee (urine), or bleeding that cannot stop. 4. You have any signs of a stroke. "BE FAST" is an easy way to remember the main warning signs: ? B - Balance. Signs are dizziness, sudden trouble walking, or loss of balance. ? E - Eyes. Signs are trouble seeing or a change in how you see. ? F - Face.  Signs are sudden weakness or loss of feeling in the face, or the face or eyelid drooping on one side. ? A - Arms. Signs are weakness or loss of feeling in an arm. This happens suddenly and usually on one side of the body. ? S - Speech. Signs are sudden trouble speaking, slurred speech, or trouble understanding what people say. ? T - Time. Time to call emergency services. Write down what time symptoms started. 5. You have other signs of a stroke, such as: ? A sudden, very bad headache with no known cause. ? Feeling like you may vomit (nausea). ? Vomiting. ? A seizure.  These symptoms may be an emergency. Do not wait to see if the symptoms will go away. Get medical help right away. Call your local emergency services (911 in the [place] Do not drive yourself to the hospital. Summary  Atrial fibrillation is a type of heartbeat that is irregular or fast.  You are at higher risk of this condition if you smoke, are older, have diabetes, or are overweight.  Follow your doctor's instructions about medicines, diet, exercise, and follow-up visits.  Get help right away if you have signs or symptoms of a stroke.  Get help right away if you cannot catch your breath, or you have chest pain or discomfort. This information is not intended to replace advice given to you by your health care provider. Make sure you discuss any questions you have with your health care provider. Document Revised: 09/03/2018 Document Reviewed: 09/03/2018 Elsevier Patient Education  2020 [employer].

## 2020-05-02 NOTE — Progress Notes (Signed)
CARDIAC REHAB PHASE I   PRE:  Rate/Rhythm: 75 SR PACs  BP:  Supine: 107/53  Sitting:   Standing:    SaO2: 92%RA  MODE:  Ambulation: 60 ft   POST:  Rate/Rhythm: 90 SR PACs    165 afib/flutter  BP:  Supine:   Sitting: 131/72  Standing:    SaO2: 87-91% RA 1308-6578 Noticed in chart that pt has not walked off oxygen.  Encouraged pt to take short walk so I can assess need for oxygen. Assisted pt to bathroom and then walked 60 ft with slow pace. Stopped at 30 ft to assess oxygen. 87% but to 91% with slow deep breaths and rest. Pt stated she has pulmonary disease and she gets winded with short distances. Returned to room and 90 SR PACs. After pt sat on side of bed her heart rate became more irregular. Pt's RN in room at this time. EKG obtained and pt put on 2L. Discussed with pt that HR irregular. She could feel palpitations. Pt understands reasoning for discharge on hold. When HR stable and pt able to ambulate. Need to assess oxygen home need.   Luetta Nutting, RN BSN  05/02/2020 9:39 AM

## 2020-05-02 NOTE — Progress Notes (Signed)
      [address]       [place] [address]             [phone]         Contacted via nursing.  Patient had been up and ambulated with cardiac rehab.  However she developed elevated HR in the 170s.  The patient had been having issues with Atrial Fibrillation post operatively.  She was treated with additional 150 mg IV Amiodarone bolus.  Her dishcarge will be canceled for today.   Lowella Dandy, PA-C

## 2020-05-02 NOTE — Care Management Important Message (Signed)
Important Message  Patient Details  Name: [name] MRN: [id number] Date of Birth: 28-May-1955   Medicare Important Message Given:  Yes     Renie Ora 05/02/2020, 10:37 AM

## 2020-05-02 NOTE — Progress Notes (Signed)
Approximately at 1145, Pt'HR still running 120-130s with afib rthym. Page PA. Another amiondarone 150 mg bolus given.will continue to monitor the pt.   Lawson Radar, RN

## 2020-05-02 NOTE — Progress Notes (Signed)
Consult received to discontinue PICC. Upon arrival at pt's bedside, unit RN stated pt to receive IV lasix before discharge. Pt requested Lasix dose be changed to PO dose that she can take once she gets home d/t long car ride. Unit RN to place VAST consult once ready for PICC to be removed.

## 2020-05-02 NOTE — TOC Transition Note (Signed)
Transition of Care (TOC) - CM/SW Discharge Note Donn Pierini RN, BSN Transitions of Care Unit 4E- RN Case Manager See Treatment Team for direct phone #    Patient Details  Name: [name] MRN: [id number] Date of Birth: Aug 18, 1955  Transition of Care [facility]) CM/SW Contact:  Darrold Span, RN Phone Number: 05/02/2020, 2:35 PM   Clinical Narrative:    Pt from home, s/p CABG- was for transition home today however went into afib this am- d/c was held for today. Orders have been placed for HHPT and DME needs- CM spoke with pt at bedside to discuss transition plan. Per pt her daughter will be staying with her post discharge to assist, she confirms she needs RW and 3n1 for home- agreeable to use in house provider to deliver to room prior to discharge.  Also discussed HHPT recs and Cardiac rehab, per pt she would prefer to wait and see how she does at home, she reports she has had HHPT in past and knows what it is all about - feels she can do what she needs to do without HH services- voices she will contact her MD if she gets home and sees she needs HH. List provided to pt for choice Per CMS guidelines from medicare.gov website with star ratings (copy placed in shadow chart) should pt decide she wants HH after return home. At this time pt is declining HHPT referral.  Empathetic listening provided to pt. Patient is hopeful to go home tomorrow if HR controlled and MD clears her.   Call made to [employer] line for RW and 3n1 referral- DME to be delivered to room prior to discharge.       Final next level of care: Home/Self Care Barriers to Discharge: Continued Medical Work up   Patient Goals and CMS Choice Patient states their goals for this hospitalization and ongoing recovery are:: return home and get back to feeling normal CMS Medicare.gov Compare Post Acute Care list provided to:: Patient Choice offered to / list presented to : Patient  Discharge Placement                 Home        Discharge Plan and Services   Discharge Planning Services: CM Consult Post Acute Care Choice: Durable Medical Equipment,Home Health          DME Arranged: 3-N-1,Walker rolling DME Agency: [employer] Date DME Agency Contacted: 05/02/20 Time DME Agency Contacted: 1427 Representative spoke with at DME Agency: Selinda Flavin Arranged: PT,Patient Refused HH          Social Determinants of Health (SDOH) Interventions     Readmission Risk Interventions Readmission Risk Prevention Plan 05/02/2020  Post Dischage Appt Complete  Medication Screening Complete  Transportation Screening Complete  Some recent data might be hidden

## 2020-05-02 NOTE — Progress Notes (Signed)
Mobility Specialist: Progress Note   05/02/20 1622  Mobility  Activity  (Cancel)   PT is currently working with pt. Will f/u tomorrow.   [facility] Aydian Dimmick Mobility Specialist Mobility Specialist Phone: [phone]

## 2020-05-02 NOTE — Discharge Summary (Incomplete Revision)
[address]       [place] [address]             [phone]    Physician Discharge Summary  Patient ID: [name] MRN: [id number] DOB/AGE: 10/20/1955 65 y.o.  Admit date: 04/25/2020 Discharge date: 05/02/2020  Admission Diagnoses:  Patient Active Problem List   Diagnosis Date Noted  . Unstable angina (HCC)   . Expected blood loss anemia 05/14/2012  . Obesity (BMI 30-39.9) 05/14/2012  . S/P left THA, AA 05/13/2012  . Gallstones 07/05/2011   Discharge Diagnoses:  Patient Active Problem List   Diagnosis Date Noted  . 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 Unstable angina (HCC)   . Expected blood loss anemia 05/14/2012  . Obesity (BMI 30-39.9) 05/14/2012  . S/P left THA, AA 05/13/2012  . Gallstones 07/05/2011     Discharged Condition: good  History of Present Illness:  [name] is a 66 yo female with known history of Hypertension and Hyperlipidemia.  The patient's issues started back in January 2020 when she developed a cough.  CXR obtained at that time was normal.  She developed right sided chest pain in June of 2020 which radiated to the RUQ.  CT scan was obtained and showed inflammatory and scarring changes in the lung.  She was not found to have a pulmonary embolism.  She was referred to Pulmonology at [facility] who performed PFTs tests.  These showed the patient to have reduced FVC with mildly decreased TL and DLCO.  She was recommended to take steroids, however the patient refused.  As when previously on she gained a significant amount of weight.  She had since lost 60 lbs.  Follow up CT scan in November she was noted to have bilateral diffuse mosaic pattern, question pneumonitis as well as findings of moderate coronary calcifications.  It was felt she should have a bronchoscopy, however this wasn't performed due to abnormal EKG preoperatively.  She was referred to Cardiology for evaluation and due to calcifications it was felt the patient should undergo cardiac  catheterization.  This was performed on 1/31 and showed Left Main disease.  It was felt the patient would require coronary bypass grafting and Cardiothoracic surgery consultation was requested.  The patient was evaluated by Dr. Vickey Sages who recommended coronary bypass grafting.  The risks and benefits of the procedure were explained to the patient and she was agreeable to proceed.  However she wished to be discharged home prior to procedure.  Hospital Course:   [name] presented to [facility] on 04/27/2020.  She was taken to the operating room and underwent CABG x 2 utilizing LIMA to LAD and RIMA to OM.  She tolerated the procedure without difficulty and was taken to the SICU in stable condition.  The patient was treated for acidosis.  She was weaned and extubated from the vent the evening of surgery.  She remains in NSR.  Her chest tube and arterial lines were removed without difficulty.  She was felt to be medically stable for transfer to the progressive care unit on 04/29/2020.  She developed Atrial Fibrillation with RVR.  She required treatment with Amiodarone.  She had tissues with her IV and had to have a central line placed.  She resumed IV Amiodarone and converted to NSR.  She was started on Eliquis to decrease her risk of stroke.  Her pacing wires were removed without difficulty.  She again developed rapid Atrial fibrillation.  She was treated with additional Amiodarone boluses and converted to NSR.  She is ambulating with minimal assistance.  PT is recommending home PT which has been arranged.  Her Pravena wound vac was removed.  Her surgical incisions are healing without evidence of infection.  She is medically stable for discharge home today.  Significant Diagnostic Studies: angiography:    Mid RCA lesion is 20% stenosed.  Mid LM to Dist LM lesion is 50% stenosed.  Ost LAD to Prox LAD lesion is 80% stenosed.  Ost Cx to Prox Cx lesion is 50% stenosed.  The left ventricular systolic  function is normal.  LV end diastolic pressure is normal.  The left ventricular ejection fraction is 55-65% by visual estimate.  There is no mitral valve regurgitation.   1. Moderate distal left main stenosis 2. Severe disease in the ostium of the large caliber LAD extending into the proximal vessel.  3. Moderate stenosis in the ostium of the large caliber Circumflex artery. The small to moderate caliber intermediate vessel has moderate proximal disease.  4. Mild non-obstructive disease in the mid RCA 5. Normal LV systolic function  Treatments: surgery:    Coronary Artery Bypass Grafting x 2              Left Internal Mammary Artery to Distal Left Anterior Descending Coronary Artery; pedicled RIMA Graft to Obtuse Marginal Branch of Left Circumflex Coronary Artery Bilateral IMA harvesting Completion graft surveillance with indocyanine green fluorescence imaging (SPY)   Surgeon:        B. Lorayne Marek, MD  Assistant:       Jaclyn Prime, PA-C  Anesthesia:    get  Operative Findings: ? preserved left ventricular systolic function ? good quality  mammary artery conduits ? good quality target vessels for grafting    Discharge Exam: Blood pressure (!) 118/57, pulse 73, temperature 98.3 F (36.8 C), temperature source Oral, resp. rate 20, height 5\' 1"  (1.549 m), weight 73.9 kg, SpO2 100 %.   General appearance: alert, cooperative and no distress Heart: regular rate and rhythm Lungs: clear to auscultation bilaterally Abdomen: soft, non-tender; bowel sounds normal; no masses,  no organomegaly Extremities: extremities normal, atraumatic, no cyanosis or edema Wound: clean and dry   Discharge Medications:  The patient has been discharged on:   1.Beta Blocker:  Yes [ X  ]                              No   [   ]                              If No, reason:  2.Ace Inhibitor/ARB: Yes [   ]                                     No  [  X  ]                                      If No, reason: labile BP  3.Statin:   Yes [ X  ]                  No  [   ]  If No, reason:  4.Ecasa:  Yes  [ X ]                  No   [   ]                  If No, reason:     Allergies as of 05/02/2020      Reactions   Imitrex [sumatriptan Base] Other (See Comments)   CHEST PAIN      Medication List    STOP taking these medications   amLODipine 5 MG tablet Commonly known as: NORVASC   triamterene-hydrochlorothiazide 37.5-25 MG tablet Commonly known as: Maxzide-25     TAKE these medications   acetaminophen 500 MG tablet Commonly known as: TYLENOL Take 2 tablets (1,000 mg total) by mouth every 6 (six) hours as needed for mild pain or fever.   albuterol 108 (90 Base) MCG/ACT inhaler Commonly known as: VENTOLIN HFA Inhale 2 puffs into the lungs every 4 (four) hours as needed for wheezing.   amiodarone 200 MG tablet Commonly known as: PACERONE Take 2 tablets (400 mg total) by mouth 2 (two) times daily. X 7 days, then decrease to 200 mg BID x 7 days, then decrease to 200 mg daily   amoxicillin 500 MG capsule Commonly known as: AMOXIL Take 500 mg by mouth as directed. Before dental aapointments   apixaban 5 MG Tabs tablet Commonly known as: ELIQUIS Take 1 tablet (5 mg total) by mouth 2 (two) times daily.   aspirin EC 81 MG tablet Take 1 tablet (81 mg total) by mouth daily. Swallow whole.   Cholecalciferol 25 MCG (1000 UT) tablet Take 1,000 Units by mouth daily.   colchicine 0.6 MG tablet Take 0.5 tablets (0.3 mg total) by mouth 2 (two) times daily. May discontinue if diarrhea develops   cyclobenzaprine 10 MG tablet Commonly known as: FLEXERIL Take 10 mg by mouth 3 (three) times daily as needed (Pain).   diclofenac Sodium 1 % Gel Commonly known as: VOLTAREN Apply 2 g topically daily as needed (Join pain).   diphenhydrAMINE 25 mg capsule Commonly known as: BENADRYL Take 50 mg by mouth at bedtime as needed for sleep (drainage).    furosemide 40 MG tablet Commonly known as: Lasix Take 1 tablet (40 mg total) by mouth daily.   gabapentin 100 MG capsule Commonly known as: NEURONTIN Take 100-200 mg by mouth at bedtime as needed (Restless leg).   metoprolol tartrate 25 MG tablet Commonly known as: LOPRESSOR Take 1 tablet (25 mg total) by mouth 2 (two) times daily.   Oxycodone HCl 10 MG Tabs Take 10 mg by mouth 4 (four) times daily as needed (Pain).   potassium chloride 20 MEQ packet Commonly known as: KLOR-CON Take 20 mEq by mouth daily.   promethazine 25 MG tablet Commonly known as: PHENERGAN Take 25 mg by mouth 4 (four) times daily as needed for nausea or vomiting.   rosuvastatin 20 MG tablet Commonly known as: CRESTOR Take 1 tablet (20 mg total) by mouth daily.            Durable Medical Equipment  (From admission, onward)         Start     Ordered   05/02/20 0710  For home use only DME Walker rolling  Once       Question Answer Comment  Walker: With 5 Inch Wheels   Patient needs a walker to treat with the following condition Physical deconditioning  05/02/20 0710   05/02/20 0709  For home use only DME 3 n 1  Once        05/02/20 0710          Follow-up Information    Linden Dolin, MD Follow up on 05/09/2020.   Specialty: Cardiothoracic Surgery Why: Appointment is at 9:30 Contact information: [address] [place] [address] [place] [address] [phone]               Signed: Lowella Dandy, PA-C 05/02/2020, 8:17 AM

## 2020-05-02 NOTE — Discharge Summary (Addendum)
[address]       [place] [address]             [phone]    Physician Discharge Summary  Patient ID: [name] MRN: [id number] DOB/AGE: 66-Mar-1957 66 y.o.  Admit date: 04/25/2020 Discharge date: 05/04/2020  Admission Diagnoses:  Patient Active Problem List   Diagnosis Date Noted  . Unstable angina (HCC)   . Expected blood loss anemia 05/14/2012  . Obesity (BMI 30-39.9) 05/14/2012  . S/P left THA, AA 05/13/2012  . Gallstones 07/05/2011   Discharge Diagnoses:  Patient Active Problem List   Diagnosis Date Noted  . PAF (paroxysmal atrial fibrillation) (HCC) 05/04/2020  . 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 Unstable angina (HCC)   . Expected blood loss anemia 05/14/2012  . Obesity (BMI 30-39.9) 05/14/2012  . S/P left THA, AA 05/13/2012  . Gallstones 07/05/2011   Discharged Condition: good  History of Present Illness:  [name] is a 65 yo female with known history of Hypertension and Hyperlipidemia.  The patient's issues started back in January 2020 when she developed a cough.  CXR obtained at that time was normal.  She developed right sided chest pain in June of 2020 which radiated to the RUQ.  CT scan was obtained and showed inflammatory and scarring changes in the lung.  She was not found to have a pulmonary embolism.  She was referred to Pulmonology at [facility] who performed PFTs tests.  These showed the patient to have reduced FVC with mildly decreased TL and DLCO.  She was recommended to take steroids, however the patient refused.  As when previously on she gained a significant amount of weight.  She had since lost 60 lbs.  Follow up CT scan in November she was noted to have bilateral diffuse mosaic pattern, question pneumonitis as well as findings of moderate coronary calcifications.  It was felt she should have a bronchoscopy, however this wasn't performed due to abnormal EKG preoperatively.  She was referred to Cardiology for evaluation and due to  calcifications it was felt the patient should undergo cardiac catheterization.  This was performed on 1/31 and showed Left Main disease.  It was felt the patient would require coronary bypass grafting and Cardiothoracic surgery consultation was requested.  The patient was evaluated by Dr. Vickey Sages who recommended coronary bypass grafting.  The risks and benefits of the procedure were explained to the patient and she was agreeable to proceed.  However she wished to be discharged home prior to procedure.  Hospital Course:   [name] presented to [facility] on 04/27/2020.  She was taken to the operating room and underwent CABG x 2 utilizing LIMA to LAD and RIMA to OM.  She tolerated the procedure without difficulty and was taken to the SICU in stable condition.  The patient was treated for acidosis.  She was weaned and extubated from the vent the evening of surgery.  She remains in NSR.  Her chest tube and arterial lines were removed without difficulty.  She was felt to be medically stable for transfer to the progressive care unit on 04/29/2020.  She developed Atrial Fibrillation with RVR.  She required treatment with Amiodarone.  She had tissues with her IV and had to have a central line placed.  She resumed IV Amiodarone and converted to NSR.  She was started on Eliquis to decrease her risk of stroke.  Her pacing wires were removed without difficulty.  She again developed rapid Atrial fibrillation.  She was treated with additional Amiodarone boluses and converted to NSR.  Her Lopressor dose was also titrated accordingly.  Her TSH level was checked and was elevated at 8.9.  She will require follow up with her PCP and if repeat level remains high she will likely need to be started on synthroid for this.  She is ambulating with minimal assistance.  PT is recommending home PT which has been arranged.  Her Pravena wound vac was removed.  Her surgical incisions are healing without evidence of infection.  She is  medically stable for discharge home today.  Significant Diagnostic Studies: angiography:    Mid RCA lesion is 20% stenosed.  Mid LM to Dist LM lesion is 50% stenosed.  Ost LAD to Prox LAD lesion is 80% stenosed.  Ost Cx to Prox Cx lesion is 50% stenosed.  The left ventricular systolic function is normal.  LV end diastolic pressure is normal.  The left ventricular ejection fraction is 55-65% by visual estimate.  There is no mitral valve regurgitation.   1. Moderate distal left main stenosis 2. Severe disease in the ostium of the large caliber LAD extending into the proximal vessel.  3. Moderate stenosis in the ostium of the large caliber Circumflex artery. The small to moderate caliber intermediate vessel has moderate proximal disease.  4. Mild non-obstructive disease in the mid RCA 5. Normal LV systolic function  Treatments: surgery:    Coronary Artery Bypass Grafting x 2              Left Internal Mammary Artery to Distal Left Anterior Descending Coronary Artery; pedicled RIMA Graft to Obtuse Marginal Branch of Left Circumflex Coronary Artery Bilateral IMA harvesting Completion graft surveillance with indocyanine green fluorescence imaging (SPY)   Surgeon:        B. Lorayne Marek, MD  Assistant:       Jaclyn Prime, PA-C  Anesthesia:    get  Operative Findings: ? preserved left ventricular systolic function ? good quality  mammary artery conduits ? good quality target vessels for grafting    Discharge Exam: Blood pressure 105/68, pulse 77, temperature 98.7 F (37.1 C), temperature source Oral, resp. rate 17, height 5\' 1"  (1.549 m), weight 72.3 kg, SpO2 94 %.   General appearance: alert, cooperative and no distress Heart: regular rate and rhythm Lungs: clear to auscultation bilaterally Abdomen: soft, non-tender; bowel sounds normal; no masses,  no organomegaly Extremities: extremities normal, atraumatic, no cyanosis or edema Wound: clean and  dry   Discharge Medications:  The patient has been discharged on:   1.Beta Blocker:  Yes [ X  ]                              No   [   ]                              If No, reason:  2.Ace Inhibitor/ARB: Yes [   ]                                     No  [  X  ]  If No, reason: labile BP  3.Statin:   Yes [ X  ]                  No  [   ]                  If No, reason:  4.Ecasa:  Yes  [ X ]                  No   [   ]                  If No, reason:     Allergies as of 05/04/2020      Reactions   Imitrex [sumatriptan Base] Other (See Comments)   CHEST PAIN      Medication List    STOP taking these medications   amLODipine 5 MG tablet Commonly known as: NORVASC   triamterene-hydrochlorothiazide 37.5-25 MG tablet Commonly known as: Maxzide-25     TAKE these medications   acetaminophen 500 MG tablet Commonly known as: TYLENOL Take 2 tablets (1,000 mg total) by mouth every 6 (six) hours as needed for mild pain or fever. Notes to patient: **NEW** For mild pain or fever. Do not take more than 3000mg  per day.   albuterol 108 (90 Base) MCG/ACT inhaler Commonly known as: VENTOLIN HFA Inhale 2 puffs into the lungs every 4 (four) hours as needed for wheezing. Notes to patient: For shortness of breath   amiodarone 200 MG tablet Commonly known as: PACERONE Take 2 tablets (400 mg total) by mouth 2 (two) times daily. X 7 days, then decrease to 200 mg BID x 7 days, then decrease to 200 mg daily Notes to patient: **NEW** To control heart rhythm.  2/5- 2/11 take 400mg  twice daily;   2/12 - 2/18 take 200mg  twice daily;  2/19 and on take 200mg  daily    amoxicillin 500 MG capsule Commonly known as: AMOXIL Take 500 mg by mouth as directed. Before dental aapointments Notes to patient: To prevent infection from dental procedure   apixaban 5 MG Tabs tablet Commonly known as: ELIQUIS Take 1 tablet (5 mg total) by mouth 2 (two) times  daily. Notes to patient: **NEW** To prevent clot   aspirin EC 81 MG tablet Take 1 tablet (81 mg total) by mouth daily. Swallow whole. Notes to patient: For heart health   Cholecalciferol 25 MCG (1000 UT) tablet Take 1,000 Units by mouth daily. Notes to patient: Vitamin D supplement   cyclobenzaprine 10 MG tablet Commonly known as: FLEXERIL Take 10 mg by mouth 3 (three) times daily as needed (Pain). Notes to patient: **NEW** For muscle spasms. May cause drowsiness and dizziness.    diclofenac Sodium 1 % Gel Commonly known as: VOLTAREN Apply 2 g topically daily as needed (Join pain). Notes to patient: For joint pain   diphenhydrAMINE 25 mg capsule Commonly known as: BENADRYL Take 50 mg by mouth at bedtime as needed for sleep (drainage). Notes to patient: For sleep   furosemide 40 MG tablet Commonly known as: Lasix Take 1 tablet (40 mg total) by mouth daily. Notes to patient: **NEW** To remove fluid    gabapentin 100 MG capsule Commonly known as: NEURONTIN Take 100-200 mg by mouth at bedtime as needed (Restless leg). Notes to patient: For restless legs. May cause drowsiness and dizziness.    metoprolol tartrate 25 MG tablet Commonly known as: LOPRESSOR Take 1.5 tablets (37.5 mg total) by mouth 3 (  three) times daily.   Oxycodone HCl 10 MG Tabs Take 10 mg by mouth 4 (four) times daily as needed (Pain). Notes to patient: For severe pain. May cause drowsiness, dizziness and constipation. May use over-the-counter docusate, miralax or senna for constipation   potassium chloride 20 MEQ packet Commonly known as: KLOR-CON Take 20 mEq by mouth daily. Notes to patient: **NEW** potassium supplement   promethazine 25 MG tablet Commonly known as: PHENERGAN Take 25 mg by mouth 4 (four) times daily as needed for nausea or vomiting. Notes to patient: For nausea   rosuvastatin 20 MG tablet Commonly known as: CRESTOR Take 1 tablet (20 mg total) by mouth daily. Notes to patient:  **NEW** To lower cholesterol and decrease artery inflammation. Watch for unexplained muscle aches/pains and talk to a doctor soon if this occurs            Durable Medical Equipment  (From admission, onward)         Start     Ordered   05/02/20 0710  For home use only DME Walker rolling  Once       Question Answer Comment  Walker: With 5 Inch Wheels   Patient needs a walker to treat with the following condition Physical deconditioning      05/02/20 0710   05/02/20 0709  For home use only DME 3 n 1  Once        05/02/20 0710          Follow-up Information    Linden Dolin, MD Follow up on 05/09/2020.   Specialty: Cardiothoracic Surgery Why: Appointment is at 9:30 Contact information: [address] [place] [address] [phone]        Rosalio Macadamia, NP Follow up.   Specialties: Nurse Practitioner, Interventional Cardiology, Cardiology, Radiology Why: Appointment is at 9:15 Contact information: [address] [place] [address] [phone]               Signed:  Lowella Dandy, PA-C 05/04/2020, 2:10 PM

## 2020-05-02 NOTE — Progress Notes (Signed)
Physical Therapy Treatment Patient Details Name: [name] MRN: [id number] DOB: 1955-11-22 Today's Date: 05/02/2020    History of Present Illness 65 y.o. female with a history of PMR,  HTN, and OA.  Presented with the diagnosis of abnormal ekg - chest pain - sob.  Underwent CORONARY ARTERY BYPASS GRAFTING (CABG) 2/3.  Patient had bilateral THA and prior L4-5 surgery in the remote past. Discharge cancelled 2/7 due to new onset Afib with RVR.    PT Comments    Pt received in supine, agreeable to bed-level therapy session, anxious due to previous Afib episode in AM but with fair participation and tolerance in supine exercises. Pt needing rest break every 5-8 reps due to BLE discomfort/arthritis pain and fatigue, but performed BLE AROM exercises per HEP handout (link: [email or web address] Access Code: [email or web address]) as able. Extensively reviewed activity pacing/energy conservation (given handout) as well as sternal precaution technique, pt receptive to all instruction.  Pt continues to benefit from PT services to progress toward functional mobility goals. Continue to recommend HHPT although pt reports she may refuse.  Follow Up Recommendations  Home health PT;Outpatient PT;Supervision/Assistance - 24 hour     Equipment Recommendations  3in1 (PT);Rolling walker with 5" wheels    Recommendations for Other Services       Precautions / Restrictions Precautions Precautions: Sternal Precaution Booklet Issued: Yes (comment) Precaution Comments: handout given with HEP/sternal precs Restrictions Weight Bearing Restrictions: No Other Position/Activity Restrictions: Educated on sternal precautions.    Mobility  Bed Mobility               General bed mobility comments: pt declined OOB mobility but able to verbalize appropriate sequence for bed mobility in order to maintain sternal precautions  Transfers                    Ambulation/Gait                 Stairs              Wheelchair Mobility    Modified Rankin (Stroke Patients Only)       Balance                                            Cognition Arousal/Alertness: Awake/alert Behavior During Therapy: WFL for tasks assessed/performed;Anxious Overall Cognitive Status: Within Functional Limits for tasks assessed                                 General Comments: tangential in conversation, asking appropriate questions about HEP/mobility but anxious regarding mobility and deferring OOB due to previous Afib episode.      Exercises General Exercises - Lower Extremity Ankle Circles/Pumps: AROM;Both;Supine;10 reps Quad Sets: AROM;Both;Supine;10 reps Gluteal Sets: AROM;Both;10 reps;Supine Heel Slides: AROM;Both;10 reps;Supine Hip ABduction/ADduction: AROM;Both;10 reps;Supine Straight Leg Raises: AROM;Both;5 reps;Supine    General Comments General comments (skin integrity, edema, etc.): BP stable in supine, HR 70-75 bpm at rest/during LE therex in bed; pt deferred EOB/OOB mobility due to pain and anxious/not wanting to have another Afib episode; reviewed extensively sternal precs, energy conservation/activity pacing, benefits of HHPT vs cardiopulmonary rehab once discharged to reach prior level of function, HEP handout      Pertinent Vitals/Pain Pain Assessment: Faces Faces Pain Scale: Hurts little more Pain Location: low back pain,  incisional soreness Pain Descriptors / Indicators: Discomfort;Sore Pain Intervention(s): Monitored during session;Repositioned;Limited activity within patient's tolerance (heels floated)   Vitals: taken at beginning of session/supine   05/02/20 1621  BP: 120/60  Pulse: 72  Resp: 20  Temp: 98.5 F (36.9 C)  SpO2: 96%    Home Living                      Prior Function            PT Goals (current goals can now be found in the care plan section) Acute Rehab PT Goals Patient Stated Goal: to get better PT  Goal Formulation: With patient Potential to Achieve Goals: Good Progress towards PT goals: Progressing toward goals (limited session but participatory)    Frequency    Min 3X/week      PT Plan Current plan remains appropriate    Co-evaluation              AM-PAC PT "6 Clicks" Mobility   Outcome Measure  Help needed turning from your back to your side while in a flat bed without using bedrails?: A Little Help needed moving from lying on your back to sitting on the side of a flat bed without using bedrails?: A Lot Help needed moving to and from a bed to a chair (including a wheelchair)?: A Little Help needed standing up from a chair using your arms (e.g., wheelchair or bedside chair)?: A Little Help needed to walk in hospital room?: A Little Help needed climbing 3-5 steps with a railing? : A Lot 6 Click Score: 16    End of Session   Activity Tolerance: Patient tolerated treatment well Patient left: in bed;with call bell/phone within reach (pt refusing bed alarm, reports she will press call bell if needing EOB/OOB mobility) Nurse Communication: Mobility status PT Visit Diagnosis: Unsteadiness on feet (R26.81);Pain;Other abnormalities of gait and mobility (R26.89);Difficulty in walking, not elsewhere classified (R26.2) Pain - Right/Left:  (low back, sternal incision) Pain - part of body:  (chest)     Time: 9030-0923 PT Time Calculation (min) (ACUTE ONLY): 37 min  Charges:  $Therapeutic Exercise: 8-22 mins $Therapeutic Activity: 8-22 mins                     Fletcher Ostermiller P., PTA Acute Rehabilitation Services Pager: [phone] Office: [phone]   Angus Palms 05/02/2020, 5:22 PM

## 2020-05-02 NOTE — Progress Notes (Signed)
Approximately 915 am, pt developed rhythm changed to 160s and sustained . EKG performed confirmed aflutter/afib. Page PA. Amiodarone 150 mg bolus ordered. Pt asymtomatic. Will continue to monitor the pt.   Lawson Radar, RN

## 2020-05-02 NOTE — Progress Notes (Signed)
PT Cancellation Note  Patient Details Name: [name] MRN: [id number] DOB: [birth year]   Cancelled Treatment:    Reason Eval/Treat Not Completed: (P) Medical issues which prohibited therapy (RN defer due to pt Afib with RVR/tachycardia.) Will continue efforts in afternoon per PT POC as schedule permits once medically appropriate.   Dorathy Kinsman [name] 05/02/2020, 10:57 AM

## 2020-05-02 NOTE — Progress Notes (Signed)
Occupational Therapy Treatment Patient Details Name: [name] MRN: [id number] DOB: [birth year] Today's Date: 05/02/2020    History of present illness 65 y.o. female with a history of PMR,  HTN, and OA.  Presented with the diagnosis of abnormal ekg - chest pain - sob.  Underwent CORONARY ARTERY BYPASS GRAFTING (CABG) 2/3.  Patient had bilateral THA and prior L4-5 surgery in the remote past.   OT comments  Pt making steady progress towards OT goals this session. Pt supine in bed upon arrival agreeable to OT intervention. Pt declined OOB mobility or ADL participation therefore session focus on education related to maintaining sternal precautions during ADL participation. Issued pt handout to increase carryover. Pt able to correctly state safe set-up for functional mobility. Education provided on all compensatory methods for maintaining precautions during ADLs. Per chart review pt later walked with cardiac rehab with pt presenting with irregular HR, plan for DC now canceled. Pt would continue to benefit from skilled occupational therapy while admitted and after d/c to address the below listed limitations in order to improve overall functional mobility and facilitate independence with BADL participation. DC plan remains appropriate, will follow acutely per POC.     Follow Up Recommendations  Home health OT    Equipment Recommendations  3 in 1 bedside commode;Tub/shower seat    Recommendations for Other Services      Precautions / Restrictions Precautions Precautions: Sternal Precaution Booklet Issued: Yes (comment) Restrictions Weight Bearing Restrictions: No Other Position/Activity Restrictions: Educated on sternal precautions.       Mobility Bed Mobility               General bed mobility comments: pt declined OOB mobility but able to verbalize appropriate sequence for bed mobility in order to maintain sternal precautions  Transfers                 General transfer  comment: pt declined tranfser training but able to state safe set- up for functional transfers    Balance                                           ADL either performed or assessed with clinical judgement   ADL Overall ADL's : Needs assistance/impaired           Upper Body Bathing Details (indicate cue type and reason): education provided on compensatory methods for UB Bathing in order to maintain sternal precautions       Upper Body Dressing Details (indicate cue type and reason): education provided on compensatory methods for UB dressing in order to maintain sternal precautions       Toilet Transfer Details (indicate cue type and reason): pt declined OOB transfer but pt able to state correct hand placement and sequence tomaintan sternal precautions   Toileting - Clothing Manipulation Details (indicate cue type and reason): educaution provided on compensatory methods for maintaining sternal precautions duing pericare       General ADL Comments: session focus on education related to ADL participation in order to maintain sternal precautions     Vision       Perception     Praxis      Cognition Arousal/Alertness: Awake/alert Behavior During Therapy: [facility] for tasks assessed/performed Overall Cognitive Status: Within Functional Limits for tasks assessed  General Comments: asking appropriate questions about DC home        Exercises     Shoulder Instructions       General Comments pt reports dtr can assist her as needed.    Pertinent Vitals/ Pain       Pain Assessment: Faces Faces Pain Scale: Hurts a little bit Pain Location: lowback Pain Descriptors / Indicators: Discomfort;Tightness Pain Intervention(s): Monitored during session  Home Living                                          Prior Functioning/Environment              Frequency  Min 2X/week        Progress  Toward Goals  OT Goals(current goals can now be found in the care plan section)  Progress towards OT goals: Progressing toward goals  Acute Rehab OT Goals Patient Stated Goal: to get better OT Goal Formulation: With patient Time For Goal Achievement: 05/13/20 Potential to Achieve Goals: Good  Plan Discharge plan remains appropriate;Frequency remains appropriate    Co-evaluation                 AM-PAC OT "6 Clicks" Daily Activity     Outcome Measure   Help from another person eating meals?: None Help from another person taking care of personal grooming?: None Help from another person toileting, which includes using toliet, bedpan, or urinal?: A Little Help from another person bathing (including washing, rinsing, drying)?: A Little Help from another person to put on and taking off regular upper body clothing?: A Little Help from another person to put on and taking off regular lower body clothing?: A Little 6 Click Score: 20    End of Session    OT Visit Diagnosis: Unsteadiness on feet (R26.81);Pain Pain - Right/Left: Right Pain - part of body: Leg   Activity Tolerance Patient tolerated treatment well   Patient Left in bed;with call bell/phone within reach   Nurse Communication Mobility status        Time: 1610-9604 OT Time Calculation (min): 8 min  Charges: OT General Charges $OT Visit: 1 Visit OT Treatments $Self Care/Home Management : 8-22 mins  Lillyth Spong., COTA/L Acute Rehabilitation Services [phone]    Chimere Klingensmith 05/02/2020, 9:14 AM

## 2020-05-03 DIAGNOSIS — I480 Paroxysmal atrial fibrillation: Secondary | ICD-10-CM

## 2020-05-03 DIAGNOSIS — Z951 Presence of aortocoronary bypass graft: Secondary | ICD-10-CM

## 2020-05-03 DIAGNOSIS — I200 Unstable angina: Secondary | ICD-10-CM | POA: Diagnosis not present

## 2020-05-03 LAB — COMPREHENSIVE METABOLIC PANEL
ALT: 36 U/L (ref 0–44)
AST: 40 U/L (ref 15–41)
Albumin: 2.9 g/dL — ABNORMAL LOW (ref 3.5–5.0)
Alkaline Phosphatase: 84 U/L (ref 38–126)
Anion gap: 10 (ref 5–15)
BUN: 17 mg/dL (ref 8–23)
CO2: 26 mmol/L (ref 22–32)
Calcium: 9.4 mg/dL (ref 8.9–10.3)
Chloride: 100 mmol/L (ref 98–111)
Creatinine, Ser: 1.02 mg/dL — ABNORMAL HIGH (ref 0.44–1.00)
GFR, Estimated: >60 mL/min (ref >60)
Glucose, Bld: 147 mg/dL — ABNORMAL HIGH (ref 70–99)
Potassium: 3.6 mmol/L (ref 3.5–5.1)
Sodium: 136 mmol/L (ref 135–145)
Total Bilirubin: 0.6 mg/dL (ref 0.3–1.2)
Total Protein: 6.2 g/dL — ABNORMAL LOW (ref 6.5–8.1)

## 2020-05-03 LAB — CBC
HCT: 26 % — ABNORMAL LOW (ref 36.0–46.0)
Hemoglobin: 8.3 g/dL — ABNORMAL LOW (ref 12.0–15.0)
MCH: 29.9 pg (ref 26.0–34.0)
MCHC: 31.9 g/dL (ref 30.0–36.0)
MCV: 93.5 fL (ref 80.0–100.0)
Platelets: 323 10*3/uL (ref 150–400)
RBC: 2.78 MIL/uL — ABNORMAL LOW (ref 3.87–5.11)
RDW: 13.5 % (ref 11.5–15.5)
WBC: 10.3 10*3/uL (ref 4.0–10.5)
nRBC: 0 % (ref 0.0–0.2)

## 2020-05-03 LAB — MAGNESIUM: Magnesium: 2 mg/dL (ref 1.7–2.4)

## 2020-05-03 LAB — GLUCOSE, CAPILLARY
Glucose-Capillary: 88 mg/dL (ref 70–99)
Glucose-Capillary: 119 mg/dL — ABNORMAL HIGH (ref 70–99)

## 2020-05-03 LAB — TSH: TSH: 8.98 u[IU]/mL — ABNORMAL HIGH (ref 0.350–4.500)

## 2020-05-03 MED ORDER — AMIODARONE IV BOLUS ONLY 150 MG/100ML
150.0000 mg | Freq: Once | INTRAVENOUS | Status: AC
  Administered 2020-05-03: 150 mg via INTRAVENOUS
  Filled 2020-05-03: qty 100

## 2020-05-03 MED ORDER — POTASSIUM CHLORIDE CRYS ER 20 MEQ PO TBCR
20.0000 meq | EXTENDED_RELEASE_TABLET | Freq: Two times a day (BID) | ORAL | Status: DC
  Administered 2020-05-03 – 2020-05-04 (×3): 20 meq via ORAL
  Filled 2020-05-03 (×4): qty 1

## 2020-05-03 MED ORDER — FUROSEMIDE 40 MG PO TABS
40.0000 mg | ORAL_TABLET | Freq: Every day | ORAL | Status: DC
  Administered 2020-05-03 – 2020-05-04 (×2): 40 mg via ORAL
  Filled 2020-05-03 (×2): qty 1

## 2020-05-03 MED ORDER — METOPROLOL TARTRATE 25 MG/10 ML ORAL SUSPENSION
25.0000 mg | Freq: Two times a day (BID) | ORAL | Status: DC
  Filled 2020-05-03 (×2): qty 10

## 2020-05-03 MED ORDER — METOPROLOL TARTRATE 25 MG PO TABS
37.5000 mg | ORAL_TABLET | Freq: Two times a day (BID) | ORAL | Status: DC
  Administered 2020-05-03: 37.5 mg via ORAL
  Filled 2020-05-03: qty 1

## 2020-05-03 NOTE — Progress Notes (Addendum)
Progress Note  Patient Name: [name] Date of Encounter: 05/03/2020  Primary Cardiologist: Dietrich Pates, MD  Subjective   Earlier today had rapid Afib with soft BP associated with upper back/shoulder/jaw/teeth pain. Given Iv Lopressor and re-bolused with amiodarone with conversion back to NSR after 1pm. Feeling much better now.   Inpatient Medications    Scheduled Meds: . amiodarone  400 mg Oral BID  . apixaban  5 mg Oral BID  . aspirin EC  81 mg Oral Daily  . bisacodyl  10 mg Oral Daily   Or  . bisacodyl  10 mg Rectal Daily  . Chlorhexidine Gluconate Cloth  6 each Topical Daily  . colchicine  0.3 mg Oral BID  . cyclobenzaprine  10 mg Oral TID  . docusate sodium  200 mg Oral Daily  . furosemide  40 mg Oral Daily  . influenza vaccine adjuvanted  0.5 mL Intramuscular Tomorrow-1000  . mouth rinse  15 mL Mouth Rinse BID  . metoprolol tartrate  37.5 mg Oral BID   Or  . metoprolol tartrate  25 mg Per Tube BID  . pantoprazole  40 mg Oral Daily  . potassium chloride  20 mEq Oral BID  . rosuvastatin  20 mg Oral Daily  . sodium chloride flush  10-40 mL Intracatheter Q12H  . sodium chloride flush  10-40 mL Intracatheter Q12H  . sodium chloride flush  3 mL Intravenous Q12H   Continuous Infusions: . sodium chloride Stopped (04/28/20 0802)  . sodium chloride    . sodium chloride 10 mL/hr at 04/27/20 1357  . lactated ringers    . lactated ringers Stopped (04/29/20 2200)   PRN Meds: sodium chloride, diphenhydrAMINE, gabapentin, levalbuterol, menthol-cetylpyridinium, metoprolol tartrate, ondansetron (ZOFRAN) IV, oxyCODONE, sodium chloride flush, sodium chloride flush, sodium chloride flush, traMADol   Vital Signs    Vitals:   05/03/20 1206 05/03/20 1255 05/03/20 1436 05/03/20 1605  BP: (!) 96/55 97/79 (!) 115/56 (!) 119/59  Pulse: 76 (!) 128 78 73  Resp: (!) 21 20 19 18   Temp: 98.4 F (36.9 C) 98.3 F (36.8 C) 97.7 F (36.5 C) 98.3 F (36.8 C)  TempSrc: Oral Oral  Oral   SpO2: 95% 97% 95% 97%  Weight:      Height:        Intake/Output Summary (Last 24 hours) at 05/03/2020 1618 Last data filed at 05/03/2020 1606 Gross per 24 hour  Intake --  Output 1150 ml  Net -1150 ml   Last 3 Weights 05/03/2020 05/02/2020 05/01/2020  Weight (lbs) 158 lb 9.6 oz 162 lb 14.7 oz 174 lb 9.7 oz  Weight (kg) 71.94 kg 73.9 kg 79.2 kg     Telemetry    NSR interspersed with PAF followed by some junctional beating before returning to primarily NSR - Personally Reviewed  Physical Exam   GEN: No acute distress.  HEENT: Normocephalic, atraumatic, sclera non-icteric. Neck: No JVD or bruits. Cardiac: RRR no murmurs, rubs, or gallops.  Radials/DP/PT 1+ and equal bilaterally.  Respiratory: Coarse BS to auscultation bilaterally but no wheezes, rales or rhonchi. Breathing is unlabored. GI: Soft, nontender, non-distended, BS +x 4. MS: no deformity. Extremities: No clubbing or cyanosis. No edema. Distal pedal pulses are 2+ and equal bilaterally. Neuro:  AAOx3. Follows commands. Psych:  Responds to questions appropriately with a normal affect.  Labs    High Sensitivity Troponin:  No results for input(s): TROPONINIHS in the last 720 hours.    Cardiac EnzymesNo results for input(s): TROPONINI  in the last 168 hours. No results for input(s): TROPIPOC in the last 168 hours.   Chemistry Recent Labs  Lab 04/29/20 0340 04/30/20 0127 05/01/20 0823  NA 138 137 139  K 3.6 3.4* 4.1  CL 107 108 107  CO2 23 22 24   GLUCOSE 94 117* 103*  BUN 11 18 15   CREATININE 0.79 0.93 0.84  CALCIUM 9.1 9.0 9.4  GFRNONAA >60 >60 >60  ANIONGAP 8 7 8      Hematology Recent Labs  Lab 04/28/20 2114 04/29/20 0340 04/30/20 0127  WBC 15.2* 12.5* 12.5*  RBC 2.61* 2.53* 2.58*  HGB 8.2* 7.9* 8.2*  HCT 24.0* 23.3* 23.3*  MCV 92.0 92.1 90.3  MCH 31.4 31.2 31.8  MCHC 34.2 33.9 35.2  RDW 14.3 14.3 14.4  PLT 149* 134* 148*    BNPNo results for input(s): BNP, PROBNP in the last 168 hours.   DDimer  No results for input(s): DDIMER in the last 168 hours.   Radiology    No results found.  Cardiac Studies   Repeat echo 04/28/20 1. Poor acoustic windows limit study. Difficult to see endocardium.  2. Left ventricular ejection fraction, by estimation, is 35 to 40%. The  left ventricle has moderately decreased function. The left ventricle  demonstrates global hypokinesis. Left ventricular diastolic parameters are  indeterminate.  3. Right ventricular systolic function is mildly reduced. The right  ventricular size is normal. There is normal pulmonary artery systolic  pressure.  4. The mitral valve is normal in structure. Mild mitral valve  regurgitation.  5. The aortic valve is abnormal. Aortic valve regurgitation is not  visualized. Mild aortic valve sclerosis is present, with no evidence of  aortic valve stenosis.   2D echo 04/25/20 1. Left ventricular ejection fraction, by estimation, is 60 to 65%. The  left ventricle has normal function. The left ventricle has no regional  wall motion abnormalities. Left ventricular diastolic parameters are  consistent with Grade I diastolic  dysfunction (impaired relaxation).  2. Right ventricular systolic function is normal. The right ventricular  size is normal. There is normal pulmonary artery systolic pressure. The  estimated right ventricular systolic pressure is 28.0 mmHg.  3. The mitral valve is normal in structure. Trivial mitral valve  regurgitation. No evidence of mitral stenosis.  4. The aortic valve is tricuspid. Aortic valve regurgitation is not  visualized. Mild aortic valve sclerosis is present, with no evidence of  aortic valve stenosis.  5. Aortic dilatation noted. There is mild dilatation of the aortic root,  measuring 38 mm.  6. The inferior vena cava is normal in size with greater than 50%  respiratory variability, suggesting right atrial pressure of 3 mmHg.   Cath 04/25/20  Mid RCA lesion is 20%  stenosed.  Mid LM to Dist LM lesion is 50% stenosed.  Ost LAD to Prox LAD lesion is 80% stenosed.  Ost Cx to Prox Cx lesion is 50% stenosed.  The left ventricular systolic function is normal.  LV end diastolic pressure is normal.  The left ventricular ejection fraction is 55-65% by visual estimate.  There is no mitral valve regurgitation.   1. Moderate distal left main stenosis 2. Severe disease in the ostium of the large caliber LAD extending into the proximal vessel.  3. Moderate stenosis in the ostium of the large caliber Circumflex artery. The small to moderate caliber intermediate vessel has moderate proximal disease.  4. Mild non-obstructive disease in the mid RCA 5. Normal LV systolic function  Recommendations:  She has complex disease involving the distal left main artery, ostial and proximal LAD, ostial Circumflex. I have reviewed her films with our IC team and there is agreement that PCI would be high risk given the complexity of the disease and potential to lose flow into the Circumflex artery if we attempt stenting of the LAD back to the ostium given the plaque burden in the left main and Circumflex. Will ask CT surgery to see her to discuss CABG. I will start a statin. She has possible interstitial lung disease. She has had recent PFTs and high res CT chest in the [facility] health [facility].    Patient Profile     65 y.o. female with PMR, HTN, OA, possible ILD being worked up by [employer] was recently seen in the office with SOB and chest pressure concerning for angina. R/LHC showed multivessel disease and normal LV function. Pre-op echo showed normal function. Repeat echo 2/3 showed EF 35-40% with global HK, mildly reduced RV function, mild MR. She was also noted to have paroxysms of rapid afib post-bypass.   Assessment & Plan    1. 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 PAF - in retrospect now thinks this may have been present even before bypass due to recognizing the palpitations from previous events - intermittently bolused with IV amiodarone per [facility] review - last 48 hours includes 2 doses on 2/7 and 1 dose today with continuation of amiodarone 400mg  BID - now maintaining NSR (when flipping between afib and NSR she does appear to have periodic junctional beats - no sustained bradycardia or pauses noted) - BB titrated to 37.5mg  BID by surgical team - on Eliquis for anticoagulation - will discuss strategy with MD - given possible ILD, amiodarone may not be the optimal choice long term if there are challenges keeping her in NSR with this regimen - will repeat labs today to reassess for any acute changes - include baseline LFTs/TSH  3. HTN - BP soft, some orthostatic symptoms - IV Lasix changed to oral today by surgical team  4. Cardiomyopathy - EF pre-surgery was normal - repeat echo 2/3 (?originating from admit orders) showed EF 35-40% but poor windows - BP too low to optimize meds otherwise - follow volume status and consider repeat limited echo to clarify LVEF, will await MD input on this  For questions or updates, please contact [facility] Please consult [email or web address] for contact info under Cardiology/STEMI.  Signed, , PA-C 05/03/2020, 4:18 PM    I have examined the patient and reviewed assessment and plan and discussed with patient.  Agree with above as stated.  Back in NSR.  COntinue Amiodarone.  With IV dose, this should help with loading.  Can also increase metorpolol to 50 mg BID if HR will allow.  Will defer on anticoagulation to CT surgery.  Amio would be short term given concern for lung disease. Would try to wean over the next 4-6 weeks as rhythm stabilizes.    COuld consider 07/01/2020 or  Multaq as alternatives if longer term antiarrhythmic was needed.   Joice Lofts

## 2020-05-03 NOTE — TOC Benefit Eligibility Note (Signed)
Transition of Care [facility]) Benefit Eligibility Note    Patient Details  Name: [name] MRN: [id number] Date of Birth: [birth year]   Medication/Dose: Eliquis 5mg  bid / Xarelto 20mg  qd  Covered?: Yes   Spoke with Person/Company/Phone Number:: pharmacy  Co-Pay: $74.69 for 30 day retail (eliquis) / $66.55 for 30 day retail (xarelto)  Prior Approval: No     Additional Notes: patient is already on eliquis and it cannot be filled until 3/2 -- patient should be able to [phone] rx coupon card    Artist Phone Number: 05/03/2020, 11:04 AM

## 2020-05-03 NOTE — Progress Notes (Signed)
Pt's HR running on 140s. Pt asymptomatic. Amiodarone 150 mg bolus ordered per PA. Will continue to monitor the pt.   Lawson Radar, RN

## 2020-05-03 NOTE — Progress Notes (Signed)
OT Cancellation Note  Patient Details Name: [name] MRN: [id number] DOB: 1955-12-08   Cancelled Treatment:    Reason Eval/Treat Not Completed: Medical issues which prohibited therapy;Other (comment) Pt with elevated HR at rest, per RN request will hold OT session this AM and return as pt medically appropriate.   Amanda Wiersma., COTA/L Acute Rehabilitation Services [phone]   [name] 05/03/2020, 10:53 AM

## 2020-05-03 NOTE — Progress Notes (Signed)
      [address]       [place] [address]             [phone]      6 Days Post-Op Procedure(s) (LRB): CORONARY ARTERY BYPASS GRAFTING (CABG) TIMES TWO USING BILATERAL INTERNAL MAMMARY ARTERIES (N/A) TRANSESOPHAGEAL ECHOCARDIOGRAM (TEE) (N/A) INDOCYANINE GREEN FLUORESCENCE IMAGING (ICG) (N/A)   Subjective:  No new complaints.  She thinks she was having A. Fib prior to surgery after yesterdays episodes.  She states she would experience similar episodes and just rest and they would resolve.  Objective: Vital signs in last 24 hours: Temp:  [98 F (36.7 C)-98.9 F (37.2 C)] 98.2 F (36.8 C) (02/08 0403) Pulse Rate:  [66-100] 66 (02/08 0403) Cardiac Rhythm: Normal sinus rhythm (02/07 1900) Resp:  [16-20] 18 (02/08 0403) BP: (96-127)/(54-73) 96/56 (02/08 0403) SpO2:  [93 %-100 %] 93 % (02/08 0403) Weight:  [71.9 kg] 71.9 kg (02/08 0536)  General appearance: alert, cooperative and no distress Heart: regular rate and rhythm Lungs: clear to auscultation bilaterally Abdomen: soft, non-tender; bowel sounds normal; no masses,  no organomegaly Extremities: extremities normal, atraumatic, no cyanosis or edema Wound: clean and dry  Lab Results: No results for input(s): WBC, HGB, HCT, PLT in the last 72 hours. BMET: Recent Labs    05/01/20 0823  NA 139  K 4.1  CL 107  CO2 24  GLUCOSE 103*  BUN 15  CREATININE 0.84  CALCIUM 9.4    PT/INR: No results for input(s): LABPROT, INR in the last 72 hours. ABG    Component Value Date/Time   PHART 7.341 (L) 04/27/2020 2231   HCO3 23.3 04/27/2020 2231   TCO2 25 04/27/2020 2231   ACIDBASEDEF 2.0 04/27/2020 2231   O2SAT 97.0 04/27/2020 2231   CBG (last 3)  Recent Labs    05/02/20 0554 05/02/20 1111 05/02/20 1626  GLUCAP 112* 158* 90    Assessment/Plan: S/P Procedure(s) (LRB): CORONARY ARTERY BYPASS GRAFTING (CABG) TIMES TWO USING BILATERAL INTERNAL MAMMARY ARTERIES (N/A) TRANSESOPHAGEAL ECHOCARDIOGRAM  (TEE) (N/A) INDOCYANINE GREEN FLUORESCENCE IMAGING (ICG) (N/A)  1. CV- episodes of Rapid A. Fib yesterday, received several boluses of Amiodarone with conversion to NSR...she will remain on Amiodarone, Lopressor, remains on Eliquis 2. Pulm- no acute issues, off oxygen 3. Renal- electrolytes WNL, weight is trending down, continue Lasix 4. Deconditioning- home PT ordered 5. Dispo- patient stable, will have nursing ambulate this morning, if she remains in NSR can plan for d/c home this afternoon   LOS: 8 days    Lowella Dandy, PA-C 05/03/2020

## 2020-05-03 NOTE — Progress Notes (Signed)
CARDIAC REHAB PHASE I   Checked on pt. HR better than this am. Stills feels worn out with some SOB. Sitting on EOB to eat lunch. Encouraged nutrition, rest, and ambulation later as able. PT helped to stand to change bed pad, states some lightheadedness. Encouraged use of rollator to walk later in case of need for rest. Pt agreeable. Provided support and encouragement. Will continue to follow.  [id number] Reynold Bowen, RN BSN 05/03/2020 2:08 PM

## 2020-05-03 NOTE — Progress Notes (Signed)
Pt converted from SR to afib with rate of 130s. Confirmed rhymth  on EKG. Pt asymptomatic. Administered scheduled pacerone and metoprolol po per PA instructed. Will continue to monitor the pt.   Lawson Radar, RN

## 2020-05-03 NOTE — Significant Event (Addendum)
Rapid Response Event Note   Reason for Call :  Afib, HR 140s-150s Pt endorses shortness of breath  Initial Focused Assessment:  Pt lying in bed, AO. She endorses shortness of breath and pain in bilateral shoulders with pain radiating up her neck. Lung sounds are clear, diminished. No accessory muscle use, non-labored breathing. Heart rate is fast, irregular. Peripheral pulses are 2+. Skin is warm, dry, pale. Diffuse edema, non-pitting to 1+.   VS: T 98.31F, BP 97/71, HR 151, RR 17, SpO2 92% on 2LNC  Interventions:  -PRN 2.5mg  IV Lopressor  Plan of Care:  -Rapid response called to another emergency. RN to notify provider, as yesterday Amiodarone bolus was used to manage HR. Current HR 105-134 bpm, BP 89/60. -Recommend EKG if pt continues to complain of pain or discomfort in her chest, shoulders, or neck  Call rapid response for additional needs.   Event Summary:  MD Notified: G. [place], [place] Call Time: [id number] Arrival Time: 0912 End Time: 6767  Jennye Moccasin, RN

## 2020-05-03 NOTE — Progress Notes (Signed)
Mobility Specialist: Progress Note   05/03/20 1742  Mobility  Activity Dangled on edge of bed  Level of Assistance Independent  Assistive Device None  Mobility Response Tolerated well  Mobility performed by Mobility specialist  $Mobility charge 1 Mobility   Post-Mobility: 74 HR, 106/78 BP, 99% SpO2  Pt EOB upon entering room with PT. Pt performed leg extension and hip flexion exercises while sitting EOB once finished with PT. Pt on 2 L/min [place] during session and back to bed after.   [facility] Jerianne Anselmo Mobility Specialist Mobility Specialist Phone: [phone]

## 2020-05-03 NOTE — Progress Notes (Signed)
PT Cancellation Note  Patient Details Name: KHANDI KERNES MRN: [id number] DOB: February 18, 1956   Cancelled Treatment:    Reason Eval/Treat Not Completed: (P) Medical issues which prohibited therapy RN defer AM session due to pt with elevated HR, will re-attempt in PM if medically appropriate as schedule permits.   Dorathy Kinsman Stacy Sailer 05/03/2020, 9:57 AM

## 2020-05-03 NOTE — Progress Notes (Signed)
@  2224 Dr. Mackie Pai, on-call for cardiology, text paged regarding pt's Afib RVR (130s-150s sustained) and SBPs 90s-100s.  Order promptly received for Amio bolus and administered. Pt converted back to NSR @ approx. 2323. Will continue to monitor.

## 2020-05-03 NOTE — Progress Notes (Signed)
Patient in Atrial fib This Am on monitor. EKGs have been performed and vital signs obtained. Currently patient systolic BP is 85/50 and -97/65mmhg respectively heart rate up to 160 on monitor. Gershon Crane [facility] on call PA made aware and stated OK to give 2.5mg  metoprolol IV as ordered.  Vitals rechecked again and bp 102/56. Metoprolol given as ordered by bedside RN see MAR.  Rapid Response RN at bedside also. Will continue to Monitor patient. Khamya Topp, Randall An RN

## 2020-05-03 NOTE — Progress Notes (Signed)
Physical Therapy Treatment Patient Details Name: [name] MRN: [id number] DOB: [birth year] Today's Date: 05/03/2020    History of Present Illness 65 y.o. female with a history of PMR,  HTN, and OA.  Presented with the diagnosis of abnormal ekg - chest pain - sob.  Underwent CORONARY ARTERY BYPASS GRAFTING (CABG) 2/3.  Patient had bilateral THA and prior L4-5 surgery in the remote past. Discharge cancelled 2/7-2/8 due to new onset Afib with RVR.    PT Comments    Pt received in supine, agreeable to therapy session and with good participation and fair tolerance for mobility. Mobility session limited for safety due to low diastolic BP/low mean arterial pressure standing at EOB, RN notified, pt reports minimal dizziness but mostly just feeling decreased strength. Pt performed log rolling with up to min guard and static sitting/weight shifting with no LOB. Min guard for safety with standing at RW. Pt agreeable to seated exercises at EOB/supine and left in care of mobility tech for completion of exercises, deferred gait trial for safety due to BP. Pt continues to benefit from PT services to progress toward functional mobility goals. Continue to recommend HHPT and increased 24hr supervision/assist at discharge once medically cleared.  Follow Up Recommendations  Home health PT;Outpatient PT;Supervision/Assistance - 24 hour     Equipment Recommendations  3in1 (PT);Rolling walker with 5" wheels    Recommendations for Other Services       Precautions / Restrictions Precautions Precautions: Sternal Precaution Booklet Issued: Yes (comment) Precaution Comments: handout already in room with HEP Restrictions Weight Bearing Restrictions: No Other Position/Activity Restrictions: Educated on sternal precautions.    Mobility  Bed Mobility Overal bed mobility: Needs Assistance Bed Mobility: Rolling;Sidelying to Sit Rolling: Supervision Sidelying to sit: Min guard;HOB elevated       General bed  mobility comments: cues for sequencing with fair carryover; increased time to upright but good compliance with Move in the Tube  Transfers Overall transfer level: Needs assistance Equipment used: Rolling walker (2 wheeled) Transfers: Sit to/from Stand Sit to Stand: Min guard         General transfer comment: cues for hand placement with poor carryover; EOB<>RW  Ambulation/Gait             General Gait Details: defer for safety at DBP 40 and mean arterial pressure (60) standing   Stairs             Wheelchair Mobility    Modified Rankin (Stroke Patients Only)       Balance Overall balance assessment: Needs assistance Sitting-balance support: Feet supported Sitting balance-Leahy Scale: Good Sitting balance - Comments: No LOB sitting EOB and weight shifting   Standing balance support: Bilateral upper extremity supported Standing balance-Leahy Scale: Poor Standing balance comment: able to stand briefly unsupported but reliant on RW for safety with static/dynamic standing                            Cognition Arousal/Alertness: Awake/alert Behavior During Therapy: [facility] for tasks assessed/performed;Anxious Overall Cognitive Status: Within Functional Limits for tasks assessed                                 General Comments: increased processing time      Exercises Other Exercises Other Exercises: pt left in care of mobility tech for seated exercises at end of session, recommend defer gait due to low  MAP/diastolic BP    General Comments General comments (skin integrity, edema, etc.): BP 111/64 (75) after arm exercise supine; BP 104/64 (76) after arm exercise seated; BP 106/40 (60) standing, deferred gait trial for safety, RN notified. BP 96/75 (81) seated after standing trial      Pertinent Vitals/Pain Pain Assessment: Faces Faces Pain Scale: Hurts a little bit Pain Location: incision site, L forearm where previous IV  infiltrated Pain Descriptors / Indicators: Discomfort;Sore Pain Intervention(s): Monitored during session;Repositioned    Home Living                      Prior Function            PT Goals (current goals can now be found in the care plan section) Acute Rehab PT Goals Patient Stated Goal: to get better PT Goal Formulation: With patient Potential to Achieve Goals: Good Progress towards PT goals: Progressing toward goals (slow progress, BP limiting)    Frequency    Min 3X/week      PT Plan Current plan remains appropriate    Co-evaluation              AM-PAC PT "6 Clicks" Mobility   Outcome Measure  Help needed turning from your back to your side while in a flat bed without using bedrails?: A Little Help needed moving from lying on your back to sitting on the side of a flat bed without using bedrails?: A Little Help needed moving to and from a bed to a chair (including a wheelchair)?: A Little Help needed standing up from a chair using your arms (e.g., wheelchair or bedside chair)?: A Little Help needed to walk in hospital room?: A Little Help needed climbing 3-5 steps with a railing? : A Lot 6 Click Score: 17    End of Session Equipment Utilized During Treatment: Oxygen Activity Tolerance: Patient tolerated treatment well;Treatment limited secondary to medical complications (Comment) (hypotensive in standing (DBP low)) Patient left: in bed;with call bell/phone within reach (seated EOB in care of mobility tech) Nurse Communication: Mobility status PT Visit Diagnosis: Unsteadiness on feet (R26.81);Pain;Other abnormalities of gait and mobility (R26.89);Difficulty in walking, not elsewhere classified (R26.2) Pain - Right/Left:  (sternal incision) Pain - part of body:  (sternal incision/chest)     Time: 6283-1517 PT Time Calculation (min) (ACUTE ONLY): 19 min  Charges:  $Therapeutic Activity: 8-22 mins                     Dallin Mccorkel P., PTA Acute  Rehabilitation Services Pager: [phone] Office: [phone]   Angus Palms 05/03/2020, 5:27 PM

## 2020-05-04 DIAGNOSIS — I200 Unstable angina: Secondary | ICD-10-CM | POA: Diagnosis not present

## 2020-05-04 DIAGNOSIS — Z951 Presence of aortocoronary bypass graft: Secondary | ICD-10-CM | POA: Diagnosis not present

## 2020-05-04 DIAGNOSIS — I480 Paroxysmal atrial fibrillation: Secondary | ICD-10-CM

## 2020-05-04 LAB — CBC
HCT: 25.8 % — ABNORMAL LOW (ref 36.0–46.0)
Hemoglobin: 8.2 g/dL — ABNORMAL LOW (ref 12.0–15.0)
MCH: 29.7 pg (ref 26.0–34.0)
MCHC: 31.8 g/dL (ref 30.0–36.0)
MCV: 93.5 fL (ref 80.0–100.0)
Platelets: 335 10*3/uL (ref 150–400)
RBC: 2.76 MIL/uL — ABNORMAL LOW (ref 3.87–5.11)
RDW: 13.7 % (ref 11.5–15.5)
WBC: 9.9 10*3/uL (ref 4.0–10.5)
nRBC: 0 % (ref 0.0–0.2)

## 2020-05-04 LAB — BASIC METABOLIC PANEL
Anion gap: 9 (ref 5–15)
BUN: 16 mg/dL (ref 8–23)
CO2: 26 mmol/L (ref 22–32)
Calcium: 9.3 mg/dL (ref 8.9–10.3)
Chloride: 101 mmol/L (ref 98–111)
Creatinine, Ser: 0.98 mg/dL (ref 0.44–1.00)
GFR, Estimated: >60 mL/min (ref >60)
Glucose, Bld: 106 mg/dL — ABNORMAL HIGH (ref 70–99)
Potassium: 4.3 mmol/L (ref 3.5–5.1)
Sodium: 136 mmol/L (ref 135–145)

## 2020-05-04 LAB — T4, FREE: Free T4: 1.5 ng/dL — ABNORMAL HIGH (ref 0.61–1.12)

## 2020-05-04 MED ORDER — METOPROLOL TARTRATE 25 MG/10 ML ORAL SUSPENSION
37.5000 mg | Freq: Two times a day (BID) | ORAL | Status: DC
  Filled 2020-05-04: qty 15

## 2020-05-04 MED ORDER — METOPROLOL TARTRATE 25 MG PO TABS: 37.5000 mg | ORAL_TABLET | Freq: Three times a day (TID) | ORAL | Status: DC

## 2020-05-04 MED ORDER — METOPROLOL TARTRATE 25 MG/10 ML ORAL SUSPENSION: 37.5000 mg | Freq: Three times a day (TID) | ORAL | Status: DC

## 2020-05-04 MED ORDER — METOPROLOL TARTRATE 25 MG PO TABS: 37.5000 mg | ORAL_TABLET | Freq: Three times a day (TID) | ORAL | 3 refills | Status: DC

## 2020-05-04 MED ORDER — METOPROLOL TARTRATE 25 MG PO TABS
25.0000 mg | ORAL_TABLET | Freq: Three times a day (TID) | ORAL | Status: DC
  Administered 2020-05-04: 25 mg via ORAL
  Filled 2020-05-04: qty 1

## 2020-05-04 MED ORDER — METOPROLOL TARTRATE 25 MG PO TABS: 37.5000 mg | ORAL_TABLET | Freq: Two times a day (BID) | ORAL | Status: DC

## 2020-05-04 NOTE — Progress Notes (Addendum)
Progress Note  Patient Name: [name] Date of Encounter: 05/04/2020  [facility] Cardiologist: No primary care provider on file.   Subjective   Reportedly had an episode of afib RVR last night and bolused with IV amiodarone. Actually don't see that event on telemetry  No further issues this morning, has ambulated with PT and done well  Inpatient Medications    Scheduled Meds: . amiodarone  400 mg Oral BID  . apixaban  5 mg Oral BID  . aspirin EC  81 mg Oral Daily  . bisacodyl  10 mg Oral Daily   Or  . bisacodyl  10 mg Rectal Daily  . Chlorhexidine Gluconate Cloth  6 each Topical Daily  . cyclobenzaprine  10 mg Oral TID  . docusate sodium  200 mg Oral Daily  . furosemide  40 mg Oral Daily  . influenza vaccine adjuvanted  0.5 mL Intramuscular Tomorrow-1000  . mouth rinse  15 mL Mouth Rinse BID  . metoprolol tartrate  37.5 mg Oral TID  . pantoprazole  40 mg Oral Daily  . potassium chloride  20 mEq Oral BID  . rosuvastatin  20 mg Oral Daily  . sodium chloride flush  10-40 mL Intracatheter Q12H   Continuous Infusions: . sodium chloride    . lactated ringers     PRN Meds: diphenhydrAMINE, gabapentin, levalbuterol, menthol-cetylpyridinium, metoprolol tartrate, ondansetron (ZOFRAN) IV, oxyCODONE, sodium chloride flush, traMADol   Vital Signs    Vitals:   05/04/20 0200 05/04/20 0230 05/04/20 0336 05/04/20 0839  BP: (!) 99/59 (!) 99/54 103/61 105/68  Pulse: 66 65 64 77  Resp: 20 19 20 17   Temp:  98.3 F (36.8 C) 98.5 F (36.9 C) 98.7 F (37.1 C)  TempSrc:  Oral Oral Oral  SpO2: 91% 91% 94%   Weight:   72.3 kg   Height:        Intake/Output Summary (Last 24 hours) at 05/04/2020 1156 Last data filed at 05/04/2020 0653 Gross per 24 hour  Intake 760 ml  Output 1225 ml  Net -465 ml   Last 3 Weights 05/04/2020 05/03/2020 05/02/2020  Weight (lbs) 159 lb 8 oz 158 lb 9.6 oz 162 lb 14.7 oz  Weight (kg) 72.349 kg 71.94 kg 73.9 kg      Telemetry    SR with episode of  ST - Personally Reviewed  Physical Exam   GEN: No acute distress.   Neck: No JVD Cardiac: RRR, no murmurs, rubs, or gallops.  Respiratory: Clear to auscultation bilaterally. GI: Soft, nontender, non-distended  MS: No edema; No deformity. Neuro:  Nonfocal  Psych: Normal affect   Labs    High Sensitivity Troponin:  No results for input(s): TROPONINIHS in the last 720 hours.    Chemistry Recent Labs  Lab 05/01/20 0823 05/03/20 1611 05/04/20 0431  NA 139 136 136  K 4.1 3.6 4.3  CL 107 100 101  CO2 24 26 26   GLUCOSE 103* 147* 106*  BUN 15 17 16   CREATININE 0.84 1.02* 0.98  CALCIUM 9.4 9.4 9.3  PROT  --  6.2*  --   ALBUMIN  --  2.9*  --   AST  --  40  --   ALT  --  36  --   ALKPHOS  --  84  --   BILITOT  --  0.6  --   GFRNONAA >60 >60 >60  ANIONGAP 8 10 9      Hematology Recent Labs  Lab 04/30/20 0127 05/03/20  1611 05/04/20 0431  WBC 12.5* 10.3 9.9  RBC 2.58* 2.78* 2.76*  HGB 8.2* 8.3* 8.2*  HCT 23.3* 26.0* 25.8*  MCV 90.3 93.5 93.5  MCH 31.8 29.9 29.7  MCHC 35.2 31.9 31.8  RDW 14.4 13.5 13.7  PLT 148* 323 335    BNPNo results for input(s): BNP, PROBNP in the last 168 hours.   DDimer No results for input(s): DDIMER in the last 168 hours.   Radiology    No results found.  Cardiac Studies   Repeat echo 04/28/20 1. Poor acoustic windows limit study. Difficult to see endocardium.  2. Left ventricular ejection fraction, by estimation, is 35 to 40%. The  left ventricle has moderately decreased function. The left ventricle  demonstrates global hypokinesis. Left ventricular diastolic parameters are  indeterminate.  3. Right ventricular systolic function is mildly reduced. The right  ventricular size is normal. There is normal pulmonary artery systolic  pressure.  4. The mitral valve is normal in structure. Mild mitral valve  regurgitation.  5. The aortic valve is abnormal. Aortic valve regurgitation is not  visualized. Mild aortic valve sclerosis is  present, with no evidence of  aortic valve stenosis.   2D echo 04/25/20 1. Left ventricular ejection fraction, by estimation, is 60 to 65%. The  left ventricle has normal function. The left ventricle has no regional  wall motion abnormalities. Left ventricular diastolic parameters are  consistent with Grade I diastolic  dysfunction (impaired relaxation).  2. Right ventricular systolic function is normal. The right ventricular  size is normal. There is normal pulmonary artery systolic pressure. The  estimated right ventricular systolic pressure is 28.0 mmHg.  3. The mitral valve is normal in structure. Trivial mitral valve  regurgitation. No evidence of mitral stenosis.  4. The aortic valve is tricuspid. Aortic valve regurgitation is not  visualized. Mild aortic valve sclerosis is present, with no evidence of  aortic valve stenosis.  5. Aortic dilatation noted. There is mild dilatation of the aortic root,  measuring 38 mm.  6. The inferior vena cava is normal in size with greater than 50%  respiratory variability, suggesting right atrial pressure of 3 mmHg.   Cath 04/25/20  Mid RCA lesion is 20% stenosed.  Mid LM to Dist LM lesion is 50% stenosed.  Ost LAD to Prox LAD lesion is 80% stenosed.  Ost Cx to Prox Cx lesion is 50% stenosed.  The left ventricular systolic function is normal.  LV end diastolic pressure is normal.  The left ventricular ejection fraction is 55-65% by visual estimate.  There is no mitral valve regurgitation.  1. Moderate distal left main stenosis 2. Severe disease in the ostium of the large caliber LAD extending into the proximal vessel.  3. Moderate stenosis in the ostium of the large caliber Circumflex artery. The small to moderate caliber intermediate vessel has moderate proximal disease.  4. Mild non-obstructive disease in the mid RCA 5. Normal LV systolic function  Recommendations: She has complex disease involving the distal left main  artery, ostial and proximal LAD, ostial Circumflex. I have reviewed her films with our IC team and there is agreement that PCI would be high risk given the complexity of the disease and potential to lose flow into the Circumflex artery if we attempt stenting of the LAD back to the ostium given the plaque burden in the left main and Circumflex. Will ask CT surgery to see her to discuss CABG. I will start a statin. She has possible interstitial lung  disease. She has had recent PFTs and high res CT chest in the [facility] health system.   Patient Profile     65 y.o. female with PMR, HTN, OA, possible ILD being worked up by [employer] was recently seen in the office with SOB and chest pressure concerning for angina. R/LHC showed multivessel disease and normal LV function. Pre-op echo showed normal function. Repeat echo 2/3 showed EF 35-40% with global HK, mildly reduced RV function, mild MR. She was also noted to have paroxysms of rapid afib post-bypass.   Assessment & Plan    1. CAD with anginal equivalent s/p bypass this admission: progressing with surgery, now on ASA, BB, statin -- colchicine has been stopped with need for amiodarone  2. PAF: intermittently bolused with IV amiodarone. Reportedly had an episode last night, but unable to find on telemetry. -- Rhythm has been stable this morning with ambulation -- now on amiodarone 400mg  BID with plans to reduce as an outpatient and DC for long term given underlying ILD  -- BB titrated to 37.5mg  BID by surgical team -- on Eliquis for anticoagulation  3. HTN: BP soft but tolerating current therapy  4. Cardiomyopathy: EF pre-surgery was normal -- repeat echo 2/3 showed EF 35-40% but poor windows -- BP too low to optimize meds otherwise -- repeat as an outpatient  5. Hypothyroidism: TSH elevated at 8.9, but Free T4 was 1.5 -- follow up with PCP  [facility] for discharge from general cardiology standpoint. Follow up has been arranged  For questions  or updates, please contact [facility] Please consult [email or web address] for contact info under    Signed, [facility], NP  05/04/2020, 11:56 AM    I have examined the patient and reviewed assessment and plan and discussed with patient.  Agree with above as stated.  RHythm currently stable.  She is considering getting an Apple watch to help with HR monitoring.  I think Amio will be temporary.  Hopefully we can wean the medicine after a few weeks.   OK to discharge from rhythm standpoint.   07/02/2020

## 2020-05-04 NOTE — Progress Notes (Signed)
Mobility Specialist: Progress Note   05/04/20 1524  Mobility  Activity Refused mobility   Pt refused mobility stating she is supposed to discharge soon and is feeling too fatigued to walk in the hallway. Pt declined bed mobility as well.   [facility] Freeman Borba Mobility Specialist Mobility Specialist Phone: [phone]

## 2020-05-04 NOTE — Progress Notes (Addendum)
CARDIOLOGY OFFICE NOTE  Date:  05/18/2020    [name] Date of Birth: 03-Jul-1955 Medical Record #[id number]  PCP:  Deloris Ping, MD  Cardiologist:  Tenny Craw (NEW)  Chief Complaint  Patient presents with  . Follow-up  . Hospitalization Follow-up    Seen for Dr. Tenny Craw (NEW)    History of Present Illness: [name] is a 65 y.o. female who presents today for a follow up visit. Seen for Dr. Tenny Craw.   She has a history of HTN and HLD. Has history of inflammatory and scarring in the lungs - no PE - seen by pulmonary at [facility] with PFTs - showing reduced FVC - refused steroids due to potential and prior weight gain when used. Has had noted coronary calcifications noted on repeat scan last November. Was going to have bronchoscopy - however EKG abnormal - referred to Cardiology - had left main disease - referred to Dr. Vickey Sages for CABG.   Earlier this month - she had CABG x 2 with LIM to LAD and RIMA to OM. Complicated by AF with RVR - treated with amiodarone - was placed on Eliquis.   She has seen Dr. Vickey Sages back last week - HR reported to have intermittently been in the 30's - had poor appetite as well - amiodarone stopped along with Lasix and potassium.   Comes in today. Here with her daughter [name]. Several concerns. She notes her BP is all over the place. Her HR is all over the place. She feels sure she is having recurrent AF. She is on Eliquis. Appetite is just fair. She is sleeping in the bed. Bowels working ok. Little walking. More concerned about her HR. Not really short of breath. She is dizzy at times. Daughter needs FMLA papers filled out. Was previously on Norvasc and sounds like she had a prior ACE cough.   Past Medical History:  Diagnosis Date  . Anemia    HX IRON INFUSION  . Arthritis   . Blood transfusion 10/2010  . Chronic back pain   . Coronary artery disease   . GERD (gastroesophageal reflux disease)   . Headache(784.0)    migraines  . Hyperlipidemia   .  Hypertension    under control, has been on med. since 2001  . Osteopenia   . Polymyalgia rheumatica (HCC)   . PONV (postoperative nausea and vomiting)   . Snores    denies apnea  . Spinal stenosis     Past Surgical History:  Procedure Laterality Date  . APPENDECTOMY    . BACK SURGERY  1990, 1992   L4-5  . CHOLECYSTECTOMY  08/17/2011   Procedure: LAPAROSCOPIC CHOLECYSTECTOMY WITH INTRAOPERATIVE CHOLANGIOGRAM;  Surgeon: Ernestene Mention, MD;  Location: [facility];  Service: General;  Laterality: N/A;  Laparoscopic cholecystectomy with cholangiogram  . CORONARY ARTERY BYPASS GRAFT N/A 04/27/2020   Procedure: CORONARY ARTERY BYPASS GRAFTING (CABG) TIMES TWO USING BILATERAL INTERNAL MAMMARY ARTERIES;  Surgeon: Linden Dolin, MD;  Location: [facility] OR;  Service: Open Heart Surgery;  Laterality: N/A;  BIMA  . JOINT REPLACEMENT  11/14/2010   right hip  . RIGHT/LEFT HEART CATH AND CORONARY ANGIOGRAPHY N/A 04/25/2020   Procedure: RIGHT/LEFT HEART CATH AND CORONARY ANGIOGRAPHY;  Surgeon: Kathleene Hazel, MD;  Location: [facility] INVASIVE CV [facility];  Service: Cardiovascular;  Laterality: N/A;  . TEE WITHOUT CARDIOVERSION N/A 04/27/2020   Procedure: TRANSESOPHAGEAL ECHOCARDIOGRAM (TEE);  Surgeon: Linden Dolin, MD;  Location: [facility] OR;  Service:  Open Heart Surgery;  Laterality: N/A;  . TOTAL HIP ARTHROPLASTY Left 05/13/2012   Procedure: TOTAL HIP ARTHROPLASTY ANTERIOR APPROACH;  Surgeon: Shelda Pal, MD;  Location: [facility] ORS;  Service: Orthopedics;  Laterality: Left;  . TUBAL LIGATION       Medications: Current Meds  Medication Sig  . acetaminophen (TYLENOL) 500 MG tablet Take 2 tablets (1,000 mg total) by mouth every 6 (six) hours as needed for mild pain or fever.  Marland Kitchen albuterol (VENTOLIN HFA) 108 (90 Base) MCG/ACT inhaler Inhale 2 puffs into the lungs every 4 (four) hours as needed for wheezing.  Marland Kitchen amoxicillin (AMOXIL) 500 MG capsule Take 500 mg by mouth as directed. Before dental  aapointments  . apixaban (ELIQUIS) 5 MG TABS tablet Take 1 tablet (5 mg total) by mouth 2 (two) times daily.  Marland Kitchen aspirin EC 81 MG tablet Take 1 tablet (81 mg total) by mouth daily. Swallow whole.  . Cholecalciferol 25 MCG (1000 UT) tablet Take 1,000 Units by mouth daily.  . cyclobenzaprine (FLEXERIL) 10 MG tablet Take 10 mg by mouth 3 (three) times daily as needed (Pain).  Marland Kitchen diclofenac Sodium (VOLTAREN) 1 % GEL Apply 2 g topically daily as needed (Join pain).  Marland Kitchen diphenhydrAMINE (BENADRYL) 25 mg capsule Take 50 mg by mouth at bedtime as needed for sleep (drainage).  . gabapentin (NEURONTIN) 100 MG capsule Take 100-200 mg by mouth at bedtime as needed (Restless leg).  . metoprolol tartrate (LOPRESSOR) 25 MG tablet Take 0.5 tablets (12.5 mg total) by mouth 2 (two) times daily.  . Oxycodone HCl 10 MG TABS Take 10 mg by mouth 4 (four) times daily as needed (Pain).  . promethazine (PHENERGAN) 25 MG tablet Take 25 mg by mouth 4 (four) times daily as needed for nausea or vomiting.  . rosuvastatin (CRESTOR) 20 MG tablet Take 1 tablet (20 mg total) by mouth daily.     Allergies: Allergies  Allergen Reactions  . Imitrex [Sumatriptan Base] Other (See Comments)    CHEST PAIN    Social History: The patient  reports that she has never smoked. She has never used smokeless tobacco. She reports that she does not drink alcohol and does not use drugs.   Family History: The patient's family history includes Anesthesia problems in her brother; Cancer in her brother and sister; Heart disease in her father; Kidney disease in her mother.   Review of Systems: Please see the history of present illness.   All other systems are reviewed and negative.   Physical Exam: VS:  BP (!) 172/116   Pulse 75   Ht 5' (1.524 m)   Wt 153 lb (69.4 kg)   SpO2 97%   BMI 29.88 kg/m  .  BMI Body mass index is 29.88 kg/m.  Wt Readings from Last 3 Encounters:  05/18/20 153 lb (69.4 kg)  05/09/20 153 lb (69.4 kg)  05/04/20  159 lb 8 oz (72.3 kg)   BP is 170/80 by me.   General: Alert and in no acute distress. Little anxious. Color pale.    Cardiac: Regular rate and rhythm. No murmurs, rubs, or gallops. No edema. Her sternum looks ok.  Respiratory:  Lungs are fairly clear to auscultation bilaterally with normal work of breathing.  GI: Soft and nontender.  MS: No deformity or atrophy. Gait and ROM intact.  Skin: Warm and dry. Color is normal.  Neuro:  Strength and sensation are intact and no gross focal deficits noted.  Psych: Alert, appropriate and with normal  affect.   LABORATORY DATA:  EKG:  EKG is ordered today.  Personally reviewed by me. This demonstrates NSR with diffuse T wave changes - HR is 75. Reviewed with Dr. Excell Seltzer.   Lab Results  Component Value Date   WBC 9.9 05/04/2020   HGB 8.2 (L) 05/04/2020   HCT 25.8 (L) 05/04/2020   PLT 335 05/04/2020   GLUCOSE 106 (H) 05/04/2020   CHOL 160 04/27/2020   TRIG 136 04/27/2020   HDL 40 (L) 04/27/2020   LDLCALC 93 04/27/2020   ALT 36 05/03/2020   AST 40 05/03/2020   NA 136 05/04/2020   K 4.3 05/04/2020   CL 101 05/04/2020   CREATININE 0.98 05/04/2020   BUN 16 05/04/2020   CO2 26 05/04/2020   TSH 8.980 (H) 05/03/2020   INR 1.4 (H) 04/27/2020   HGBA1C 6.3 (H) 04/27/2020     BNP (last 3 results) No results for input(s): BNP in the last 8760 hours.  ProBNP (last 3 results) No results for input(s): PROBNP in the last 8760 hours.   Other Studies Reviewed Today:  Significant Diagnostic Studies: angiography:    Mid RCA lesion is 20% stenosed.  Mid LM to Dist LM lesion is 50% stenosed.  Ost LAD to Prox LAD lesion is 80% stenosed.  Ost Cx to Prox Cx lesion is 50% stenosed.  The left ventricular systolic function is normal.  LV end diastolic pressure is normal.  The left ventricular ejection fraction is 55-65% by visual estimate.  There is no mitral valve regurgitation.  1. Moderate distal left main stenosis 2. Severe disease  in the ostium of the large caliber LAD extending into the proximal vessel.  3. Moderate stenosis in the ostium of the large caliber Circumflex artery. The small to moderate caliber intermediate vessel has moderate proximal disease.  4. Mild non-obstructive disease in the mid RCA 5. Normal LV systolic function  Treatments: surgery:    Coronary Artery Bypass Grafting x2 Left Internal Mammary Artery to Distal Left Anterior Descending Coronary Artery; pedicled RIMAGraft to Obtuse Marginal Branch of Left Circumflex Coronary Artery Bilateral IMA harvesting Completion graft surveillance with indocyanine green fluorescence imaging (SPY)    Assessment/Plan: 1. CAD - now s/p CABG x 2 - slow progress - most likely having recurrent AF - discussed with Dr. Excell Seltzer - will NOT restart amiodarone given concern for significant bradycardia - Metoprolol will be increased back to 25 mg BID. 7 day live Zio to be placed. She will need close follow up. Lab today. Would hold on cardiac rehab for now.   2. HTN - lability - will add prn Norvasc with parameters at 2.5mg  prn daily for SBP over 170 or DBP over 100  3. HLD - on statin - will need repeat lab in a few weeks fasting.   4. Post op AF - see #1.   5. Abnormal chest CT - with need for bronchoscopy - not really discussed today - this will need to be addressed once she has recovered or further out from her surgery.    Current medicines are reviewed with the patient today.  The patient does not have concerns regarding medicines other than what has been noted above.  The following changes have been made:  See above.  Labs/ tests ordered today include:    Orders Placed This Encounter  Procedures  . EKG 12-Lead     Disposition:   FU with Dr. Tenny Craw Derrek Monaco in about 2 weeks and then Dr. Tenny Craw in 4  to 6 weeks with fasting labs on that visit. Plan as outlined above.    Patient is agreeable to this plan and will call if any problems develop  in the interim.   SignedNorma Fredrickson, NP  05/18/2020 9:52 AM  [facility] [address] [place], [place]  [address] Phone: [phone] Fax: [phone]        Addendum: Noted no available appointment times until mid March.   Rosalio Macadamia, RN, ANP-C [facility] [address] [place], [place]  [address] [phone]

## 2020-05-04 NOTE — Progress Notes (Signed)
      [address]       [place] [address]             [phone]      7 Days Post-Op Procedure(s) (LRB): CORONARY ARTERY BYPASS GRAFTING (CABG) TIMES TWO USING BILATERAL INTERNAL MAMMARY ARTERIES (N/A) TRANSESOPHAGEAL ECHOCARDIOGRAM (TEE) (N/A) INDOCYANINE GREEN FLUORESCENCE IMAGING (ICG) (N/A)   Subjective:  No new complaints.  Patient experienced more Atrial Fibrillation overnight.  Objective: Vital signs in last 24 hours: Temp:  [97.7 F (36.5 C)-99 F (37.2 C)] 98.5 F (36.9 C) (02/09 0336) Pulse Rate:  [61-155] 64 (02/09 0336) Cardiac Rhythm: Normal sinus rhythm (02/09 0336) Resp:  [17-31] 20 (02/09 0336) BP: (89-119)/(43-86) 103/61 (02/09 0336) SpO2:  [89 %-99 %] 94 % (02/09 0336) Weight:  [72.3 kg] 72.3 kg (02/09 0336)  Intake/Output from previous day: 02/08 0701 - 02/09 0700 In: 760 [P.O.:360; I.V.:400] Out: 1875 [Urine:1875]  General appearance: alert, cooperative and no distress Heart: regular rate and rhythm Lungs: clear to auscultation bilaterally Abdomen: soft, non-tender; bowel sounds normal; no masses,  no organomegaly Extremities: extremities normal, atraumatic, no cyanosis or edema Wound: clean and dry  Lab Results: Recent Labs    05/03/20 1611 05/04/20 0431  WBC 10.3 9.9  HGB 8.3* 8.2*  HCT 26.0* 25.8*  PLT 323 335   BMET:  Recent Labs    05/03/20 1611 05/04/20 0431  NA 136 136  K 3.6 4.3  CL 100 101  CO2 26 26  GLUCOSE 147* 106*  BUN 17 16  CREATININE 1.02* 0.98  CALCIUM 9.4 9.3    PT/INR: No results for input(s): LABPROT, INR in the last 72 hours. ABG    Component Value Date/Time   PHART 7.341 (L) 04/27/2020 2231   HCO3 23.3 04/27/2020 2231   TCO2 25 04/27/2020 2231   ACIDBASEDEF 2.0 04/27/2020 2231   O2SAT 97.0 04/27/2020 2231   CBG (last 3)  Recent Labs    05/02/20 1626 05/03/20 1115 05/03/20 1601  GLUCAP 90 88 119*    Assessment/Plan: S/P Procedure(s) (LRB): CORONARY ARTERY BYPASS  GRAFTING (CABG) TIMES TWO USING BILATERAL INTERNAL MAMMARY ARTERIES (N/A) TRANSESOPHAGEAL ECHOCARDIOGRAM (TEE) (N/A) INDOCYANINE GREEN FLUORESCENCE IMAGING (ICG) (N/A)  1. CV- PAF, another episode of A. Fib overnight, received additional Amiodarone bolus with conversion to NSR, she is in NSR currently-colchicine has been stopped,  continue Amiodarone, will adjust lopressor to TID dosing, continue Eliquis 2. Pulm- no acute issues, continue IS 3. Renal- creatinine WNL, weight is trending down, continue Lasix 4. Hypothyroidism?- TSH is elevated at 8.9 Free T4 is 1.5.Marland KitchenMarland Kitchenrecommend follow up with PCP with repeat level check and if indicated synthroid can be initiated 5. Dispo- patient with continued episodes of Rapid A. Fib, she responds to Amiodarone boluses and remains in NSR this morning, continue Amiodarone for now, adjusted Lopressor to TID dosing, continue Eliquis, possible new onset Hypothyroidism, patient will need follow up with primary care at discharge, appreciated cardiology assistance, patient will be ready for discharge once she is stable from rhythm issues   LOS: 9 days    Lowella Dandy, PA-C' 05/04/2020

## 2020-05-04 NOTE — Progress Notes (Signed)
[id number] Education completed with pt who voiced understanding. Sternal precautions and staying in the tube reviewed. Discussed importance of IS and walking as tolerated for pulmonary improvement. Gave modified walking instructions due to respiratory issues.. Gave heart healthy diet. Discussed CRP 2 and will refer to [place]. Pt will consider. Encouraged pt to have daughter read materials also. Luetta Nutting RN BSN 05/04/2020 2:31 PM

## 2020-05-04 NOTE — Progress Notes (Signed)
Discharge summary reviewed including appointments, medications, and activity level.  Daughter at bedside.  Pt with her belongings ie:  3 in 1 & walker.  Assisted to private vehicle w/o difficulty

## 2020-05-04 NOTE — Progress Notes (Signed)
Occupational Therapy Treatment Patient Details Name: [name] MRN: [id number] DOB: [birth year] Today's Date: 05/04/2020    History of present illness 65 y.o. female with a history of PMR,  HTN, and OA.  Presented with the diagnosis of abnormal ekg - chest pain - sob.  Underwent CORONARY ARTERY BYPASS GRAFTING (CABG) 2/3.  Patient had bilateral THA and prior L4-5 surgery in the remote past. Discharge cancelled 2/7-2/8 due to new onset Afib with RVR.   OT comments  Patient seen this date prior to discharge to ensure compliance and understanding of ADL completion given sternal precautions.  Patient able to complete UB/LB dressing from sit/stand level with Mod I.  Demo and verbal cues given, and patient demonstrated good understanding.  Educated patient regarding energy conservation, and household/home management tasks she could perform as therapy to build back her activity tolerance.  Any remaining questions answered, and no follow up OT recommended.  The patient will have assist as needed from her daughter.    Follow Up Recommendations  No OT follow up    Equipment Recommendations  3 in 1 bedside commode;Tub/shower seat    Recommendations for Other Services      Precautions / Restrictions Precautions Precautions: Sternal Precaution Booklet Issued: Yes (comment) Precaution Comments: handout already in room with HEP Restrictions Weight Bearing Restrictions: No RUE Weight Bearing: Weight bearing as tolerated LUE Weight Bearing: Weight bearing as tolerated Other Position/Activity Restrictions: Educated on sternal precautions.  Pushing thrugh her knees.       Mobility Bed Mobility Overal bed mobility: Needs Assistance Bed Mobility: Sit to Supine Rolling: Modified independent (Device/Increase time) Sidelying to sit: HOB elevated;Supervision   Sit to supine: Modified independent (Device/Increase time)   General bed mobility comments: to R EOB, did not need physical  assist  Transfers Overall transfer level: Needs assistance Equipment used: Rolling walker (2 wheeled) Transfers: Sit to/from Stand Sit to Stand: Modified independent (Device/Increase time)         General transfer comment: cues for hand placement with limited; EOB>RW x1, toilet>RW and chair<>RW x3 reps    Balance Overall balance assessment: Mild deficits observed, not formally tested Sitting-balance support: Feet supported Sitting balance-Leahy Scale: Good Sitting balance - Comments: No LOB sitting EOB and weight shifting   Standing balance support: Bilateral upper extremity supported Standing balance-Leahy Scale: Poor Standing balance comment: able to stand briefly unsupported but lightly reliant on RW for safety with static/dynamic standing                           ADL either performed or assessed with clinical judgement   ADL                   Upper Body Dressing : Modified independent;Sitting;Standing   Lower Body Dressing: Modified independent;Sit to/from stand               Functional mobility during ADLs: Modified independent       Vision       Perception     Praxis      Cognition Arousal/Alertness: Awake/alert Behavior During Therapy: WFL for tasks assessed/performed Overall Cognitive Status: Within Functional Limits for tasks assessed                                 General Comments: increased processing time, mildly anxious regarding mobility but participatory with encouragement  Exercises Exercises: Other exercises Other Exercises Other Exercises: B arm pumps x10 reps prior to standing to decrease chance of dizziness/stabilize BP   Shoulder Instructions       General Comments HR 65-77 bpm, BP WNL prior to mobility and no dizziness reported, not otherwise assessed; RR 20-28 rpm and reviewed slow pursed-lip breathing techniques    Pertinent Vitals/ Pain       Pain Assessment: Faces Faces Pain  Scale: Hurts a little bit Pain Location: incision site Pain Descriptors / Indicators: Tender Pain Intervention(s): Monitored during session  Home Living                                          Prior Functioning/Environment              Frequency  Min 2X/week        Progress Toward Goals  OT Goals(current goals can now be found in the care plan section)  Progress towards OT goals: Progressing toward goals  Acute Rehab OT Goals Patient Stated Goal: hoping to discharge home today OT Goal Formulation: With patient Time For Goal Achievement: 05/13/20 Potential to Achieve Goals: Good  Plan Discharge plan remains appropriate    Co-evaluation                 AM-PAC OT "6 Clicks" Daily Activity     Outcome Measure   Help from another person eating meals?: None Help from another person taking care of personal grooming?: None Help from another person toileting, which includes using toliet, bedpan, or urinal?: None Help from another person bathing (including washing, rinsing, drying)?: A Little Help from another person to put on and taking off regular upper body clothing?: A Little Help from another person to put on and taking off regular lower body clothing?: None 6 Click Score: 22    End of Session    OT Visit Diagnosis: Unsteadiness on feet (R26.81);Pain   Activity Tolerance Patient tolerated treatment well   Patient Left in bed;with call bell/phone within reach   Nurse Communication          Time: 4174-0814 OT Time Calculation (min): 17 min  Charges: OT General Charges $OT Visit: 1 Visit OT Treatments $Self Care/Home Management : 8-22 mins  05/04/2020  Rich, OTR/L  Acute Rehabilitation Services  Office:  [phone]    Suzanna Obey 05/04/2020, 1:25 PM

## 2020-05-04 NOTE — Progress Notes (Signed)
Physical Therapy Treatment Patient Details Name: [name] MRN: [id number] DOB: 12-24-1955 Today's Date: 05/04/2020    History of Present Illness 65 y.o. female with a history of PMR,  HTN, and OA.  Presented with the diagnosis of abnormal ekg - chest pain - sob.  Underwent CORONARY ARTERY BYPASS GRAFTING (CABG) 2/3.  Patient had bilateral THA and prior L4-5 surgery in the remote past. Discharge cancelled 2/7-2/8 due to new onset Afib with RVR.    PT Comments    Pt received in supine, agreeable to therapy session and with improved participation/activity tolerance this date. Pt able to progress gait distance to 283ft (including 2 seated breaks) using RW and Supervision, mostly needing encouragement to progress gait distance and cues for activity pacing/slow deep breaths due to pt anxiety tends to breathe shallowly at times. Plan to perform curb step trial next session if pt remains hospitalized. Pt continues to benefit from PT services to progress toward functional mobility goals. Continue to recommend HHPT vs OPPT to progress strength/endurance once home, anticipate pt safe to discharge in care of family once medically cleared.   Follow Up Recommendations  Home health PT;Outpatient PT;Supervision/Assistance - 24 hour (HHPT vs OPPT depending on transportation available; anticipate she will benefit most from OPPT for endurance building)     Equipment Recommendations  3in1 (PT);Rolling walker with 5" wheels    Recommendations for Other Services       Precautions / Restrictions Precautions Precautions: Sternal Precaution Booklet Issued: Yes (comment) Precaution Comments: handout already in room with HEP Restrictions Weight Bearing Restrictions: No Other Position/Activity Restrictions: Educated on sternal precautions.    Mobility  Bed Mobility Overal bed mobility: Needs Assistance Bed Mobility: Rolling;Sidelying to Sit Rolling: Modified independent (Device/Increase time) Sidelying to  sit: HOB elevated;Supervision       General bed mobility comments: to R EOB, did not need physical assist  Transfers Overall transfer level: Needs assistance Equipment used: Rolling walker (2 wheeled) Transfers: Sit to/from Stand Sit to Stand: Supervision         General transfer comment: cues for hand placement with limited; EOB>RW x1, toilet>RW and chair<>RW x3 reps  Ambulation/Gait Ambulation/Gait assistance: Supervision Gait Distance (Feet): 250 Feet ((including 2 seated breaks)) Assistive device: Rolling walker (2 wheeled) Gait Pattern/deviations: Step-through pattern;Narrow base of support;Decreased stride length Gait velocity: reduced, grossly <0.4 m/s, variable 0.3-0.4 m/s   General Gait Details: pt without LOB, chair follow due to pt anxiety regarding mobility/decreased activity tolerance; pt with decreased cadence and RR 20-28 rpm, HR variable 65-75 bpm and needed x2 seated rest breaks to achieve distance   Stairs Stairs:  (pt defers to attempt however able to verbalize safe sequencing and reports step height <4", she is able to flex hips to proper height to simulate steps and anticipate pt will have no difficulty)           Wheelchair Mobility    Modified Rankin (Stroke Patients Only)       Balance Overall balance assessment: Needs assistance Sitting-balance support: Feet supported Sitting balance-Leahy Scale: Good Sitting balance - Comments: No LOB sitting EOB and weight shifting   Standing balance support: Bilateral upper extremity supported Standing balance-Leahy Scale: Poor Standing balance comment: able to stand briefly unsupported but lightly reliant on RW for safety with static/dynamic standing                            Cognition Arousal/Alertness: Awake/alert Behavior During Therapy: [facility]  for tasks assessed/performed;Anxious Overall Cognitive Status: Within Functional Limits for tasks assessed                                  General Comments: increased processing time, mildly anxious regarding mobility but participatory with encouragement      Exercises Other Exercises Other Exercises: B arm pumps x10 reps prior to standing to decrease chance of dizziness/stabilize BP    General Comments General comments (skin integrity, edema, etc.): HR 65-77 bpm, BP WNL prior to mobility and no dizziness reported, not otherwise assessed; RR 20-28 rpm and reviewed slow pursed-lip breathing techniques      Pertinent Vitals/Pain Pain Assessment: No/denies pain Pain Intervention(s): Monitored during session;Repositioned    Home Living                      Prior Function            PT Goals (current goals can now be found in the care plan section) Acute Rehab PT Goals Patient Stated Goal: to get better PT Goal Formulation: With patient Potential to Achieve Goals: Good Progress towards PT goals: Progressing toward goals    Frequency    Min 3X/week      PT Plan Current plan remains appropriate    Co-evaluation              AM-PAC PT "6 Clicks" Mobility   Outcome Measure  Help needed turning from your back to your side while in a flat bed without using bedrails?: None Help needed moving from lying on your back to sitting on the side of a flat bed without using bedrails?: None Help needed moving to and from a bed to a chair (including a wheelchair)?: A Little Help needed standing up from a chair using your arms (e.g., wheelchair or bedside chair)?: A Little Help needed to walk in hospital room?: A Little Help needed climbing 3-5 steps with a railing? : A Little 6 Click Score: 20    End of Session Equipment Utilized During Treatment: Oxygen Activity Tolerance: Patient tolerated treatment well;Treatment limited secondary to medical complications (Comment) (hypotensive in standing (DBP low)) Patient left: with call bell/phone within reach;in chair (RN seated outside room) Nurse  Communication: Mobility status PT Visit Diagnosis: Unsteadiness on feet (R26.81);Pain;Other abnormalities of gait and mobility (R26.89);Difficulty in walking, not elsewhere classified (R26.2) Pain - Right/Left:  (sternal incision) Pain - part of body:  (sternal incision)     Time: 2703-5009 PT Time Calculation (min) (ACUTE ONLY): 27 min  Charges:  $Gait Training: 8-22 mins $Therapeutic Activity: 8-22 mins                     Tavonna Worthington P., PTA Acute Rehabilitation Services Pager: [phone] Office: [phone]   Angus Palms 05/04/2020, 10:44 AM

## 2020-05-06 ENCOUNTER — Telehealth: Payer: Self-pay | Admitting: Internal Medicine

## 2020-05-06 ENCOUNTER — Telehealth (HOSPITAL_COMMUNITY): Payer: Self-pay

## 2020-05-06 NOTE — Telephone Encounter (Signed)
Pt insurance is active and benefits verified through [employer] Co-pay 0, DED $350/$350 met, out of pocket $6,000/$128.91 met, co-insurance 35%. no pre-authorization required. Passport, 05/06/2020'@12' :16pm, REF# [id number]  2ndary insurance is active and benefits verified through Medicare a/b. Co-pay 0, DED $233/$233 met, out of pocket 0/0 met, co-insurance 20%. No pre-authorization required. Passport, 05/06/2020'@12' :25pm, REF# [id number]  Will contact patient to see if she is interested in the Cardiac Rehab Program. If interested, patient will need to complete follow up appt. Once completed, patient will be contacted for scheduling upon review by the RN Navigator.

## 2020-05-06 NOTE — Telephone Encounter (Signed)
Pt had post op echo on 04/28/20. Cancelled echo on 2/15 and called pt and informed. She has post op f/u with LTyrone Sage on 2/23.

## 2020-05-06 NOTE — Telephone Encounter (Signed)
Called patient to see if she is interested in the Cardiac Rehab Program. Patient expressed interest. Explained scheduling process and went over insurance, patient verbalized understanding. Will contact patient for scheduling once f/u has been completed. 

## 2020-05-06 NOTE — Telephone Encounter (Signed)
New Message"     Pt said she had an Echo on 04-28-20 at the hospital.  She wants to know if she can cx her Echo here on 05-09-20?

## 2020-05-09 ENCOUNTER — Encounter: Payer: Self-pay | Admitting: Cardiothoracic Surgery

## 2020-05-09 ENCOUNTER — Ambulatory Visit
Admission: RE | Admit: 2020-05-09 | Discharge: 2020-05-09 | Disposition: A | Discharge status: Home / Self Care | Payer: Medicare Other | Source: Ambulatory Visit | Attending: Cardiothoracic Surgery | Admitting: Cardiothoracic Surgery

## 2020-05-09 ENCOUNTER — Ambulatory Visit (INDEPENDENT_AMBULATORY_CARE_PROVIDER_SITE_OTHER): Payer: Self-pay | Admitting: Cardiothoracic Surgery

## 2020-05-09 ENCOUNTER — Other Ambulatory Visit: Payer: Self-pay

## 2020-05-09 ENCOUNTER — Other Ambulatory Visit: Payer: Self-pay | Admitting: Cardiothoracic Surgery

## 2020-05-09 ENCOUNTER — Ambulatory Visit: Payer: Federal, State, Local not specified - PPO | Admitting: Cardiothoracic Surgery

## 2020-05-09 VITALS — BP 114/68 | HR 66 | Resp 20 | Ht 61.0 in | Wt 153.0 lb

## 2020-05-09 DIAGNOSIS — Z951 Presence of aortocoronary bypass graft: Secondary | ICD-10-CM

## 2020-05-09 MED ORDER — METOPROLOL TARTRATE 25 MG PO TABS: 12.5000 mg | ORAL_TABLET | Freq: Two times a day (BID) | ORAL | 11 refills | Status: DC

## 2020-05-09 NOTE — Progress Notes (Signed)
[address]       [place] [address]             [phone]     CARDIOTHORACIC SURGERY OFFICE NOTE  Referring Provider is Ryter-Brown, [name] [name], * Primary Cardiologist is No primary care provider on file. PCP is Ryter-Brown, [name] [name], MD   HPI:  65 year old lady is status post CABG x2 on 04/27/2020.  She returns today for additional postoperative visit after recent discharge.  She reports bradycardia into the heart rate of 30s on a couple of occasions and felt poorly when this occurred.  She also reports poor appetite and an abnormal taste sensation that is likely related to diuretic medications.  She denies chest pain or fevers and chills  Current Outpatient Medications  Medication Sig Dispense Refill  . acetaminophen (TYLENOL) 500 MG tablet Take 2 tablets (1,000 mg total) by mouth every 6 (six) hours as needed for mild pain or fever. 30 tablet 0  . albuterol (VENTOLIN HFA) 108 (90 Base) MCG/ACT inhaler Inhale 2 puffs into the lungs every 4 (four) hours as needed for wheezing.    Marland Kitchen amoxicillin (AMOXIL) 500 MG capsule Take 500 mg by mouth as directed. Before dental aapointments    . apixaban (ELIQUIS) 5 MG TABS tablet Take 1 tablet (5 mg total) by mouth 2 (two) times daily. 60 tablet 3  . aspirin EC 81 MG tablet Take 1 tablet (81 mg total) by mouth daily. Swallow whole. 90 tablet 3  . Cholecalciferol 25 MCG (1000 UT) tablet Take 1,000 Units by mouth daily.    . cyclobenzaprine (FLEXERIL) 10 MG tablet Take 10 mg by mouth 3 (three) times daily as needed (Pain).    Marland Kitchen diclofenac Sodium (VOLTAREN) 1 % GEL Apply 2 g topically daily as needed (Join pain).    Marland Kitchen diphenhydrAMINE (BENADRYL) 25 mg capsule Take 50 mg by mouth at bedtime as needed for sleep (drainage).    . gabapentin (NEURONTIN) 100 MG capsule Take 100-200 mg by mouth at bedtime as needed (Restless leg).    . metoprolol tartrate (LOPRESSOR) 25 MG tablet Take 0.5 tablets (12.5 mg total) by mouth 2 (two) times  daily. 60 tablet 11  . Oxycodone HCl 10 MG TABS Take 10 mg by mouth 4 (four) times daily as needed (Pain).    . promethazine (PHENERGAN) 25 MG tablet Take 25 mg by mouth 4 (four) times daily as needed for nausea or vomiting.    . rosuvastatin (CRESTOR) 20 MG tablet Take 1 tablet (20 mg total) by mouth daily. 30 tablet 3   No current facility-administered medications for this visit.      Physical Exam:   BP 114/68 (BP Location: Right Arm, Patient Position: Sitting)   Pulse 66   Resp 20   Ht 5\' 1"  (1.549 m)   Wt 69.4 kg   SpO2 90% Comment: RA with mask on  BMI 28.91 kg/m   General:  Well-appearing no acute distress  Chest:   Clear to auscultation bilaterally  CV:   Regular rate and rhythm  Incisions:  Well-healed  Abdomen:  Soft nontender  Extremities:  Mild edema  Diagnostic Tests:  PA and lateral chest x-ray from today is reviewed and this shows clear lung fields and a stable mediastinal silhouette.   Impression:  Doing well after CABG.  Plan:  F/u in 2 weeks as scheduled--she may cancel this if feeling better by then Stop Lasix/potassium; stop amiodarone Dose change entered for  metoprolol I spent in excess of 20 minutes during the conduct of this office consultation and >50% of this time involved direct face-to-face encounter with the patient for counseling and/or coordination of their care.  Level 2                 10 minutes Level 3                 15 minutes Level 4                 25 minutes Level 5                 40 minutes  B. Lorayne Marek, MD 05/09/2020 10:09 AM

## 2020-05-10 ENCOUNTER — Other Ambulatory Visit (HOSPITAL_COMMUNITY): Payer: Federal, State, Local not specified - PPO

## 2020-05-18 ENCOUNTER — Telehealth: Payer: Self-pay | Admitting: Internal Medicine

## 2020-05-18 ENCOUNTER — Ambulatory Visit (INDEPENDENT_AMBULATORY_CARE_PROVIDER_SITE_OTHER): Payer: Medicare Other | Admitting: Nurse Practitioner

## 2020-05-18 ENCOUNTER — Other Ambulatory Visit: Payer: Self-pay

## 2020-05-18 ENCOUNTER — Encounter: Payer: Self-pay | Admitting: Radiology

## 2020-05-18 ENCOUNTER — Encounter: Payer: Self-pay | Admitting: Nurse Practitioner

## 2020-05-18 ENCOUNTER — Ambulatory Visit (INDEPENDENT_AMBULATORY_CARE_PROVIDER_SITE_OTHER): Payer: Medicare Other

## 2020-05-18 VITALS — BP 172/116 | HR 75 | Ht 60.0 in | Wt 153.0 lb

## 2020-05-18 DIAGNOSIS — I10 Essential (primary) hypertension: Secondary | ICD-10-CM

## 2020-05-18 DIAGNOSIS — Z951 Presence of aortocoronary bypass graft: Secondary | ICD-10-CM

## 2020-05-18 DIAGNOSIS — I259 Chronic ischemic heart disease, unspecified: Secondary | ICD-10-CM | POA: Diagnosis not present

## 2020-05-18 DIAGNOSIS — I4891 Unspecified atrial fibrillation: Secondary | ICD-10-CM

## 2020-05-18 DIAGNOSIS — E782 Mixed hyperlipidemia: Secondary | ICD-10-CM

## 2020-05-18 DIAGNOSIS — Z0279 Encounter for issue of other medical certificate: Secondary | ICD-10-CM

## 2020-05-18 LAB — BASIC METABOLIC PANEL
BUN/Creatinine Ratio: 14 (ref 12–28)
BUN: 13 mg/dL (ref 8–27)
CO2: 19 mmol/L — ABNORMAL LOW (ref 20–29)
Calcium: 10.9 mg/dL — ABNORMAL HIGH (ref 8.7–10.3)
Chloride: 105 mmol/L (ref 96–106)
Creatinine, Ser: 0.94 mg/dL (ref 0.57–1.00)
GFR calc Af Amer: 74 mL/min/{1.73_m2} (ref >59)
GFR calc non Af Amer: 64 mL/min/{1.73_m2} (ref >59)
Glucose: 111 mg/dL — ABNORMAL HIGH (ref 65–99)
Potassium: 4.8 mmol/L (ref 3.5–5.2)
Sodium: 139 mmol/L (ref 134–144)

## 2020-05-18 LAB — CBC
Hematocrit: 34.5 % (ref 34.0–46.6)
Hemoglobin: 11.3 g/dL (ref 11.1–15.9)
MCH: 30.3 pg (ref 26.6–33.0)
MCHC: 32.8 g/dL (ref 31.5–35.7)
MCV: 93 fL (ref 79–97)
Platelets: 536 10*3/uL — ABNORMAL HIGH (ref 150–450)
RBC: 3.73 x10E6/uL — ABNORMAL LOW (ref 3.77–5.28)
RDW: 13.5 % (ref 11.7–15.4)
WBC: 12.7 10*3/uL — ABNORMAL HIGH (ref 3.4–10.8)

## 2020-05-18 MED ORDER — METOPROLOL TARTRATE 25 MG PO TABS: 25.0000 mg | ORAL_TABLET | Freq: Two times a day (BID) | ORAL | 11 refills | Status: DC

## 2020-05-18 MED ORDER — AMLODIPINE BESYLATE 2.5 MG PO TABS: 2.5000 mg | ORAL_TABLET | Freq: Every day | ORAL | 3 refills | Status: DC | PRN

## 2020-05-18 NOTE — Telephone Encounter (Signed)
Forms from patient's daughter received on 05/18/20. Completed patient auth attached. Took form to Dr. Tenny Craw box for completion due to Norma Fredrickson retiring. JG 05/18/20

## 2020-05-18 NOTE — Patient Instructions (Addendum)
After Visit Summary:  We will be checking the following labs today - BMET & CBC   Medication Instructions:    Continue with your current medicines. BUT  We are going to increase the Lopressor to 25 mg twice a day.   You may use Norvasc 2.5 mg prn once a day prn BP >902 systolic or >409 diastolic   If you need a refill on your cardiac medications before your next appointment, please call your pharmacy.     Testing/Procedures To Be Arranged:  7 day live monitor  Follow-Up:   See Dr. Vincenza Hews in about 10 to 14 days  See Dr. Harrington Challenger in 4 to 6 weeks with fasting labs.     At [facility], you and your health needs are our priority.  As part of our continuing mission to provide you with exceptional heart care, we have created designated Provider Care Teams.  These Care Teams include your primary Cardiologist (physician) and Advanced Practice Providers (APPs -  Physician Assistants and Nurse Practitioners) who all work together to provide you with the care you need, when you need it.  Special Instructions:  . Stay safe, wash your hands for at least 20 seconds and wear a mask when needed.  . It was good to talk with you today.    Call the [place] office at [phone] if you have any questions, problems or concerns.      ZIO AT Long term monitor-Live Telemetry  Your physician has requested you wear a ZIO patch monitor for _7_ days.  This is a single patch monitor. [employer] supplies one patch monitor per enrollment. Additional stickers are not available.  Please do not apply patch if you will be having a Nuclear Stress Test, Echocardiogram, Cardiac CT, MRI, or Chest Xray during the time frame you would be wearing the monitor. The patch cannot be worn during these tests. You cannot remove and re-apply the ZIO AT patch monitor.   Your ZIO patch monitor will be sent Fed Ex from [employer] directly to your home address. The monitor may also  be mailed to a PO BOX if home delivery is not available. It may take 3-5 days to receive your monitor after you have been enrolled.  Once you have received you monitor, please review enclosed instructions. Your monitor has already been registered assigning a specific monitor serial # to you.   Applying the monitor  Shave hair from upper left chest.  Hold abrader disc by orange tab. Rub abrader in 40 strokes over left upper chest as indicated in your monitor instructions.  Clean area with 4 enclosed alcohol pads. Use all pads to ensure the area is cleaned thoroughly. Let dry.  Apply patch as indicated in monitor instructions. Patch will be placed under collarbone on left side of chest with arrow pointing upward.  Rub patch adhesive wings for 2 minutes. Remove the white label marked "1". Remove the white label marked "2". Rub patch adhesive wings for 2 additional minutes.  While looking in a mirror, press and release button in center of patch. A small green light will flash 3-4 times. This will be your only indicator the monitor has been turned on.  Do not shower for the first 24 hours. You may shower after the first 24 hours.  Press the button if you feel a symptom. You will hear a small click. Record Date, Time and Symptom in the Patient Log.   Starting the [employer]  In your kit there is a small plastic box the size of a cellphone. This is Airline pilot. It transmits all your recorded data to [facility]. This box must stay within 10 feet of you at all times. Open the box and push the * button. There will be a light that blinks orange and then green a few times. When the light stops blinking, the Gateway is connected to the ZIO patch.  Call [employer] at [phone] to confirm your monitor is transmitting.   Returning your monitor  Remove your patch and place it inside the [place]. In the lower half of the Gateway there is a white bag with prepaid postage on it. Place Gateway in bag and seal. Mail package  back to [facility] as soon as possible. Your physician should have your final report approximately 7 days after you have mailed back your monitor.   Call [place] at [phone] if you have questions regarding your ZIO AT patch monitor. Call them immediately if you see an orange light blinking on your monitor.  If your monitor falls off in less than 4 days contact our Monitor department at [phone]. If your monitor becomes loose or falls off after 4 days call [employer] at [phone] for suggestions on securing your monitor.

## 2020-05-18 NOTE — Progress Notes (Signed)
Enrolled patient for a 7 day Zio AT Monitor to be mailed to patients home

## 2020-05-18 NOTE — Addendum Note (Signed)
Addended by: Rosalio Macadamia on: 05/18/2020 10:27 AM   Modules accepted: Orders

## 2020-05-19 ENCOUNTER — Telehealth: Payer: Self-pay | Admitting: Nurse Practitioner

## 2020-05-19 NOTE — Telephone Encounter (Signed)
Patient returning call for lab results. 

## 2020-05-20 ENCOUNTER — Other Ambulatory Visit: Payer: Self-pay | Admitting: Cardiothoracic Surgery

## 2020-05-20 DIAGNOSIS — Z951 Presence of aortocoronary bypass graft: Secondary | ICD-10-CM

## 2020-05-22 NOTE — Progress Notes (Signed)
Pt needs f/u before midmarch.  Can she add on with me as overbook

## 2020-05-23 ENCOUNTER — Telehealth: Payer: Self-pay | Admitting: *Deleted

## 2020-05-23 ENCOUNTER — Ambulatory Visit: Payer: Self-pay | Admitting: Cardiothoracic Surgery

## 2020-05-23 NOTE — Telephone Encounter (Signed)
-----   Message from Pricilla Riffle, MD sent at 05/22/2020 10:39 PM EST -----   ----- Message ----- From: Rosalio Macadamia, NP Sent: 05/18/2020  10:27 AM EST To: Pricilla Riffle, MD  Not able to get her back in with anyone until mid March.  Lawson Fiscal

## 2020-05-23 NOTE — Telephone Encounter (Signed)
Patient returned call. She wants to know if Dr. Tenny Craw still wants her to come in on 03/03 beings that she just got the heart monitor today. Please advise.

## 2020-05-23 NOTE — Telephone Encounter (Signed)
Author: Pricilla Riffle, MD Service: Cardiology Author Type: Physician  Filed: 05/22/2020 10:40 PM Encounter Date: 05/18/2020 Status: Signed  Editor: Pricilla Riffle, MD (Physician)    Pt needs f/u before midmarch.  Can she add on with me as overbook       LEFT MESSAGE FOR PATIENT TO CALL BACK.  SHE SHOULD BE ADDED IN ONE OF THE DOD SPOTS ON 05/26/20.

## 2020-05-24 ENCOUNTER — Telehealth: Payer: Self-pay | Admitting: Physician Assistant

## 2020-05-24 DIAGNOSIS — I259 Chronic ischemic heart disease, unspecified: Secondary | ICD-10-CM | POA: Diagnosis not present

## 2020-05-24 DIAGNOSIS — Z951 Presence of aortocoronary bypass graft: Secondary | ICD-10-CM

## 2020-05-24 DIAGNOSIS — I10 Essential (primary) hypertension: Secondary | ICD-10-CM | POA: Diagnosis not present

## 2020-05-24 DIAGNOSIS — I4891 Unspecified atrial fibrillation: Secondary | ICD-10-CM | POA: Diagnosis not present

## 2020-05-24 DIAGNOSIS — E782 Mixed hyperlipidemia: Secondary | ICD-10-CM

## 2020-05-24 NOTE — Telephone Encounter (Signed)
Spontaneous transmission after monitor placed, pt in atrial flutter.  No sx reported.  HR just over 100.  Pt already on Eliquis  Theodore Demark, PA-C 05/24/2020 7:46 PM

## 2020-05-26 ENCOUNTER — Encounter: Payer: Self-pay | Admitting: *Deleted

## 2020-05-26 NOTE — Progress Notes (Unsigned)
FMLA forms for patient daughter completed by Dr. Tenny Craw and returned to medical records.

## 2020-06-10 ENCOUNTER — Ambulatory Visit: Payer: Federal, State, Local not specified - PPO | Admitting: Physician Assistant

## 2020-06-14 ENCOUNTER — Encounter: Payer: Self-pay | Admitting: Internal Medicine

## 2020-06-14 ENCOUNTER — Ambulatory Visit (INDEPENDENT_AMBULATORY_CARE_PROVIDER_SITE_OTHER): Payer: Medicare Other | Admitting: Internal Medicine

## 2020-06-14 ENCOUNTER — Other Ambulatory Visit: Payer: Self-pay

## 2020-06-14 VITALS — BP 130/70 | HR 70 | Ht 60.0 in | Wt 151.0 lb

## 2020-06-14 DIAGNOSIS — I259 Chronic ischemic heart disease, unspecified: Secondary | ICD-10-CM | POA: Diagnosis not present

## 2020-06-14 DIAGNOSIS — I10 Essential (primary) hypertension: Secondary | ICD-10-CM | POA: Diagnosis not present

## 2020-06-14 DIAGNOSIS — R0602 Shortness of breath: Secondary | ICD-10-CM | POA: Diagnosis not present

## 2020-06-14 NOTE — Progress Notes (Signed)
Cardiology Office Note   Date:  06/14/2020   ID:  [name], [name] Oct 27, 1955, MRN [id number]  PCP:  Deloris Ping, MD  Cardiologist:   Dietrich Pates, MD   Pt returns for f/u of CAD     History of Present Illness: [name] is a 65 y.o. female with a history of PMR,  HTN, and OA   I have seen her in the past as she accomp her daughter to clinic (has MVP)   I saw the patinet in Feb    She had becom complaining of lung problems (coughing, SOB) since 2020    R sided CP   Seen at [facility].   CT shoced scaring and inflamm lungs  O2 sats 88%   ? Hypersensitivity Spirometry showed decreased FEV1 and FBC  The pt was seen at [facility] in Nov 2021 by Dr Candie Echevaria   CT of chest showed bilateral diffuse mosaic  pattern,  ? Pneumonitis  Note CT showed moderate coronary calcificaitons   Bronchoscopy planned  EKG done in preop   Abnormal     The pt was seen in IM on 04/04/20  PFTs recomm   Work up for ILD and pneuonitis.   BP was found to be severely elevated at 182/98   Placed on lisinopril/ HCTZ   She had been on lisinopril in the past   Complicated by cough    I saw her in Jan 2022   She complained of chest pressure with activity  PFTs had shown only mild limitation a couple years prior.  CT did showe coronary calcificastions    I set the pt up for a R and L heart cath    See below   The pt underwent CABG x 2 (LIMA to LAD; RIMA to OM)   Post op complicated by Afib with RVR  Rx with amiodarone and Eliqus    After d/c amiodarone was stopped due to bradycardia and poor appetite.  The pt was seen by Darrel Hoover on Feb 23   The pt felt she was having afib   Metoprolol increased to 25 bid  Amlodipine added for BP (2.5 mg as needed For SBP over 170)    The pt wore a monitor   Initally had 1 spell of afib  But after that none  The pt says she is feeling better   Chst wall still a little sore    Breathing better than pre surgery     Current Meds  Medication Sig  . acetaminophen (TYLENOL) 500 MG tablet Take 2  tablets (1,000 mg total) by mouth every 6 (six) hours as needed for mild pain or fever.  Marland Kitchen albuterol (VENTOLIN HFA) 108 (90 Base) MCG/ACT inhaler Inhale 2 puffs into the lungs every 4 (four) hours as needed for wheezing.  Marland Kitchen amLODipine (NORVASC) 2.5 MG tablet Take 1 tablet (2.5 mg total) by mouth daily as needed (SBP >170 or DBP >100).  Marland Kitchen amoxicillin (AMOXIL) 500 MG capsule Take 500 mg by mouth as directed. Before dental aapointments  . apixaban (ELIQUIS) 5 MG TABS tablet Take 1 tablet (5 mg total) by mouth 2 (two) times daily.  Marland Kitchen aspirin EC 81 MG tablet Take 1 tablet (81 mg total) by mouth daily. Swallow whole.  . Cholecalciferol 25 MCG (1000 UT) tablet Take 1,000 Units by mouth daily.  . cyclobenzaprine (FLEXERIL) 10 MG tablet Take 10 mg by mouth 3 (three) times daily as needed (Pain).  Marland Kitchen diclofenac Sodium (VOLTAREN)  1 % GEL Apply 2 g topically daily as needed (Join pain).  Marland Kitchen diphenhydrAMINE (BENADRYL) 25 mg capsule Take 50 mg by mouth at bedtime as needed for sleep (drainage).  . gabapentin (NEURONTIN) 100 MG capsule Take 100-200 mg by mouth at bedtime as needed (Restless leg).  . metoprolol tartrate (LOPRESSOR) 25 MG tablet Take 1 tablet (25 mg total) by mouth 2 (two) times daily.  . Oxycodone HCl 10 MG TABS Take 10 mg by mouth 4 (four) times daily as needed (Pain).  . promethazine (PHENERGAN) 25 MG tablet Take 25 mg by mouth 4 (four) times daily as needed for nausea or vomiting.  . rosuvastatin (CRESTOR) 20 MG tablet Take 1 tablet (20 mg total) by mouth daily.     Allergies:   Imitrex [sumatriptan base]   Past Medical History:  Diagnosis Date  . Anemia    HX IRON INFUSION  . Arthritis   . Blood transfusion 10/2010  . Chronic back pain   . Coronary artery disease   . GERD (gastroesophageal reflux disease)   . Headache(784.0)    migraines  . Hyperlipidemia   . Hypertension    under control, has been on med. since 2001  . Osteopenia   . Polymyalgia rheumatica (HCC)   . PONV  (postoperative nausea and vomiting)   . Snores    denies apnea  . Spinal stenosis     Past Surgical History:  Procedure Laterality Date  . APPENDECTOMY    . BACK SURGERY  1990, 1992   L4-5  . CHOLECYSTECTOMY  08/17/2011   Procedure: LAPAROSCOPIC CHOLECYSTECTOMY WITH INTRAOPERATIVE CHOLANGIOGRAM;  Surgeon: Ernestene Mention, MD;  Location: [facility];  Service: General;  Laterality: N/A;  Laparoscopic cholecystectomy with cholangiogram  . CORONARY ARTERY BYPASS GRAFT N/A 04/27/2020   Procedure: CORONARY ARTERY BYPASS GRAFTING (CABG) TIMES TWO USING BILATERAL INTERNAL MAMMARY ARTERIES;  Surgeon: Linden Dolin, MD;  Location: [facility] OR;  Service: Open Heart Surgery;  Laterality: N/A;  BIMA  . JOINT REPLACEMENT  11/14/2010   right hip  . RIGHT/LEFT HEART CATH AND CORONARY ANGIOGRAPHY N/A 04/25/2020   Procedure: RIGHT/LEFT HEART CATH AND CORONARY ANGIOGRAPHY;  Surgeon: Kathleene Hazel, MD;  Location: [facility];  Service: Cardiovascular;  Laterality: N/A;  . TEE WITHOUT CARDIOVERSION N/A 04/27/2020   Procedure: TRANSESOPHAGEAL ECHOCARDIOGRAM (TEE);  Surgeon: Linden Dolin, MD;  Location: [facility] OR;  Service: Open Heart Surgery;  Laterality: N/A;  . TOTAL HIP ARTHROPLASTY Left 05/13/2012   Procedure: TOTAL HIP ARTHROPLASTY ANTERIOR APPROACH;  Surgeon: Shelda Pal, MD;  Location: [facility];  Service: Orthopedics;  Laterality: Left;  . TUBAL LIGATION       Social History:  The patient  reports that she has never smoked. She has never used smokeless tobacco. She reports that she does not drink alcohol and does not use drugs.   Family History:  The patient's family history includes Anesthesia problems in her brother; Cancer in her brother and sister; Heart disease in her father; Kidney disease in her mother.    ROS:  Please see the history of present illness. All other systems are reviewed and  Negative to the above problem except as noted.    PHYSICAL EXAM: VS:  BP  130/70   Pulse 70   Ht 5' (1.524 m)   Wt 151 lb (68.5 kg)   SpO2 96%   BMI 29.49 kg/m   GEN: Overweight 65 yo in no acute distress  HEENT: normal  Neck: no JVD, carotid bruits Cardiac: RRR; no murmurs  No LE  edema  Respiratory:  clear to auscultation bilaterally,  GI: soft, nontender, nondistended, + BS  No hepatomegaly  MS: no deformity Moving all extremities   Skin: warm and dry, no rash Neuro:  Strength and sensation are intact Psych: euthymic mood, full affect   EKG:  EKG is not done today     Echo  04/25/20 1. Left ventricular ejection fraction, by estimation, is 60 to 65%. The left ventricle has normal function. The left ventricle has no regional wall motion abnormalities. Left ventricular diastolic parameters are consistent with Grade I diastolic dysfunction (impaired relaxation). 2. Right ventricular systolic function is normal. The right ventricular size is normal. There is normal pulmonary artery systolic pressure. The estimated right ventricular systolic pressure is 28.0 mmHg. 3. The mitral valve is normal in structure. Trivial mitral valve regurgitation. No evidence of mitral stenosis. 4. The aortic valve is tricuspid. Aortic valve regurgitation is not visualized. Mild aortic valve sclerosis is present, with no evidence of aortic valve stenosis. 5. Aortic dilatation noted. There is mild dilatation of the aortic root, measuring 38 mm. 6. The inferior vena cava is normal in size with greater than 50% respiratory variability, suggesting right atrial pressure of 3 mmHg.   Cardiac cath   04/25/20   Mid RCA lesion is 20% stenosed.  Mid LM to Dist LM lesion is 50% stenosed.  Ost LAD to Prox LAD lesion is 80% stenosed.  Ost Cx to Prox Cx lesion is 50% stenosed.  The left ventricular systolic function is normal.  LV end diastolic pressure is normal.  The left ventricular ejection fraction is 55-65% by visual estimate.  There is no mitral valve regurgitation.    1. Moderate distal left main stenosis 2. Severe disease in the ostium of the large caliber LAD extending into the proximal vessel.  3. Moderate stenosis in the ostium of the large caliber Circumflex artery. The small to moderate caliber intermediate vessel has moderate proximal disease.  4. Mild non-obstructive disease in the mid RCA 5. Normal LV systolic function  R heart cath:   RA 0   RV 28/2  Mean PAP 13   PCWP 2     Echo  04/28/20  1. Poor acoustic windows limit study. Difficult to see endocardium. 2. Left ventricular ejection fraction, by estimation, is 35 to 40%. The left ventricle has moderately decreased function. The left ventricle demonstrates global hypokinesis. Left ventricular diastolic parameters are indeterminate. 3. Right ventricular systolic function is mildly reduced. The right ventricular size is normal. There is normal pulmonary artery systolic pressure. 4. The mitral valve is normal in structure. Mild mitral valve regurgitation. 5. The aortic valve is abnormal. Aortic valve regurgitation is not visualized. Mild aortic valve sclerosis is present, with no evidence of aortic valve stenosis. Lipid Panel    Component Value Date/Time   CHOL 160 04/27/2020 0306   CHOL 231 (H) 04/18/2020 1641   TRIG 136 04/27/2020 0306   HDL 40 (L) 04/27/2020 0306   HDL 45 04/18/2020 1641   CHOLHDL 4.0 04/27/2020 0306   VLDL 27 04/27/2020 0306   LDLCALC 93 04/27/2020 0306   LDLCALC 136 (H) 04/18/2020 1641      Wt Readings from Last 3 Encounters:  06/14/20 151 lb (68.5 kg)  05/18/20 153 lb (69.4 kg)  05/09/20 153 lb (69.4 kg)      ASSESSMENT AND PLAN:  1  CAD   Pt is s/p CABG  Doing better than before surgery   Volume status looks good    I would keep on same regimen for now    Will need a repeat echo    I think EF has probably improved   2  Atrial fibrillation   The pt had only one spell of atrial fibrillation early in wearing monitor   None after     WIll review   Keep  on Eliquis for now    2  Lipids   Pt on Crestor   LDL will need to be followed     3  HTN  BP good now   WIll need to follow     4  Pulmonary   WIll review records from [facility]   Pt will need f/u when recovers     Current medicines are reviewed at length with the patient today.  The patient does not have concerns regarding medicines.  Signed, Dietrich Pates, MD  06/14/2020 2:10 PM    [facility] [address] [place], [place], [place]  [address] Phone: [phone]; Fax: [phone]

## 2020-06-14 NOTE — Patient Instructions (Signed)
Medication Instructions:  The current medical regimen is effective;  continue present plan and medications.  *If you need a refill on your cardiac medications before your next appointment, please call your pharmacy*  Testing/Procedures: Your physician has requested that you have an echocardiogram at the end of April. Echocardiography is a painless test that uses sound waves to create images of your heart. It provides your doctor with information about the size and shape of your heart and how well your heart's chambers and valves are working. This procedure takes approximately one hour. There are no restrictions for this procedure.  Follow-Up: At [facility], you and your health needs are our priority.  As part of our continuing mission to provide you with exceptional heart care, we have created designated Provider Care Teams.  These Care Teams include your primary Cardiologist (physician) and Advanced Practice Providers (APPs -  Physician Assistants and Nurse Practitioners) who all work together to provide you with the care you need, when you need it.  We recommend signing up for the patient portal called "MyChart".  Sign up information is provided on this After Visit Summary.  MyChart is used to connect with patients for Virtual Visits (Telemedicine).  Patients are able to view lab/test results, encounter notes, upcoming appointments, etc.  Non-urgent messages can be sent to your provider as well.   To learn more about what you can do with MyChart, go to [email or web address].    Your next appointment:   2 month(s)  The format for your next appointment:   In Person  Provider:   Dietrich Pates, MD  Thank you for choosing [facility]!!

## 2020-07-15 ENCOUNTER — Ambulatory Visit (HOSPITAL_COMMUNITY): Payer: Medicare Other | Attending: Internal Medicine

## 2020-07-15 ENCOUNTER — Other Ambulatory Visit: Payer: Self-pay

## 2020-07-15 DIAGNOSIS — R0602 Shortness of breath: Secondary | ICD-10-CM | POA: Insufficient documentation

## 2020-07-15 DIAGNOSIS — I2510 Atherosclerotic heart disease of native coronary artery without angina pectoris: Secondary | ICD-10-CM | POA: Diagnosis not present

## 2020-07-15 DIAGNOSIS — I259 Chronic ischemic heart disease, unspecified: Secondary | ICD-10-CM | POA: Diagnosis not present

## 2020-07-15 DIAGNOSIS — I10 Essential (primary) hypertension: Secondary | ICD-10-CM | POA: Insufficient documentation

## 2020-07-15 LAB — ECHOCARDIOGRAM COMPLETE
Area-P 1/2: 1.77 cm2
S' Lateral: 2.4 cm

## 2020-07-22 ENCOUNTER — Encounter (HOSPITAL_COMMUNITY): Payer: Self-pay

## 2020-07-26 NOTE — Telephone Encounter (Signed)
I called pt to let her know that I received her MyChart message. She didn't answer so I LM stating that Dr Tenny Craw could go over her Echo results with her at her upcoming appt.

## 2020-07-31 NOTE — Progress Notes (Signed)
Cardiology Office Note   Date:  08/02/2020   ID:  [name] 01/20/1956, MRN [id number]  PCP:  Wayland Salinas, MD  Cardiologist:   Dorris Carnes, MD   Pt returns for f/u of CAD     History of Present Illness: [name] is a 65 y.o. female with a history of PMR,  HTN, and OA    She began complaining of lung problems (coughing, SOB) since 2020    R sided CP   Seen at [facility].   CT showed scarring and inflamm lungs  O2 sats 88%   ? Hypersensitivity Spirometry showed decreased FEV1 and FBC The pt was seen at [facility] in Nov 2021 by Dr Early Osmond   CT of chest there showed bilateral diffuse mosaic  pattern,  ? Pneumonitis  Note CT also showed moderate coronary calcificaitons   Bronchoscopy planned  EKG done in preop   Abnormal    The pt was seen in IM on 04/04/20  PFTs recomm   Work up for ILD and pneuonitis.   BP was found to be severely elevated at 182/98   Placed on lisinopril/ HCTZ   She had been on lisinopril in the past   Complicated by cough    I saw her in Jan 2022   She complained of chest pressure with activity Given CT findings of CAD,   I set the pt up for a R and L heart cath    See below The pt went on to have CABG x 2 (LIMA to LAD; RIMA to OM)   Post op complicated by Afib with RVR  Rx with amiodarone and Eliqus    After d/c amiodarone was stopped due to bradycardia and poor appetite.  The pt was seen by Tommas Olp on Feb 23   The pt felt she was having afib   Metoprolol increased to 25 bid  Amlodipine added for BP (2.5 mg as needed For SBP over 170)   The pt wore a monitor   Monitor showed  1 spell of afib early on  She sensed this   There were none after     I saw the pt in clinic in March 2022 Since seen she is feeling much better     She says she feels better than before surgery   NO SOB   No chest pains   Wants to do more  Current Meds  Medication Sig  . albuterol (VENTOLIN HFA) 108 (90 Base) MCG/ACT inhaler Inhale 2 puffs into the lungs every 4 (four) hours as needed for  wheezing.  Marland Kitchen amLODipine (NORVASC) 2.5 MG tablet Take 1 tablet (2.5 mg total) by mouth daily as needed (SBP >170 or DBP >100).  Marland Kitchen amoxicillin (AMOXIL) 500 MG capsule Take 500 mg by mouth as directed. Before dental aapointments  . apixaban (ELIQUIS) 5 MG TABS tablet Take 1 tablet (5 mg total) by mouth 2 (two) times daily.  Marland Kitchen aspirin EC 81 MG tablet Take 1 tablet (81 mg total) by mouth daily. Swallow whole.  . Cholecalciferol 25 MCG (1000 UT) tablet Take 1,000 Units by mouth daily.  . cyclobenzaprine (FLEXERIL) 10 MG tablet Take 10 mg by mouth 3 (three) times daily as needed (Pain).  Marland Kitchen diclofenac Sodium (VOLTAREN) 1 % GEL Apply 2 g topically daily as needed (Join pain).  Marland Kitchen diphenhydrAMINE (BENADRYL) 25 mg capsule Take 50 mg by mouth at bedtime as needed for sleep (drainage).  . gabapentin (NEURONTIN) 100 MG capsule  Take 100-200 mg by mouth at bedtime as needed (Restless leg).  . metoprolol tartrate (LOPRESSOR) 25 MG tablet Take 1 tablet (25 mg total) by mouth 2 (two) times daily.  . Oxycodone HCl 10 MG TABS Take 10 mg by mouth 4 (four) times daily as needed (Pain).  . promethazine (PHENERGAN) 25 MG tablet Take 25 mg by mouth 4 (four) times daily as needed for nausea or vomiting.  . rosuvastatin (CRESTOR) 20 MG tablet Take 1 tablet (20 mg total) by mouth daily.     Allergies:   Imitrex [sumatriptan base]   Past Medical History:  Diagnosis Date  . Anemia    HX IRON INFUSION  . Arthritis   . Blood transfusion 10/2010  . Chronic back pain   . Coronary artery disease   . GERD (gastroesophageal reflux disease)   . Headache(784.0)    migraines  . Hyperlipidemia   . Hypertension    under control, has been on med. since 2001  . Osteopenia   . Polymyalgia rheumatica ([place])   . PONV (postoperative nausea and vomiting)   . Snores    denies apnea  . Spinal stenosis     Past Surgical History:  Procedure Laterality Date  . APPENDECTOMY    . [place]   L4-5  . CHOLECYSTECTOMY   08/17/2011   Procedure: LAPAROSCOPIC CHOLECYSTECTOMY WITH INTRAOPERATIVE CHOLANGIOGRAM;  Surgeon: Adin Hector, MD;  Location: [place];  Service: General;  Laterality: N/A;  Laparoscopic cholecystectomy with cholangiogram  . CORONARY ARTERY BYPASS GRAFT N/A 04/27/2020   Procedure: CORONARY ARTERY BYPASS GRAFTING (CABG) TIMES TWO USING BILATERAL INTERNAL MAMMARY ARTERIES;  Surgeon: Wonda Olds, MD;  Location: [place];  Service: Open Heart Surgery;  Laterality: N/A;  BIMA  . JOINT REPLACEMENT  11/14/2010   right hip  . RIGHT/LEFT HEART CATH AND CORONARY ANGIOGRAPHY N/A 04/25/2020   Procedure: RIGHT/LEFT HEART CATH AND CORONARY ANGIOGRAPHY;  Surgeon: Burnell Blanks, MD;  Location: [place] CV LAB;  Service: Cardiovascular;  Laterality: N/A;  . TEE WITHOUT CARDIOVERSION N/A 04/27/2020   Procedure: TRANSESOPHAGEAL ECHOCARDIOGRAM (TEE);  Surgeon: Wonda Olds, MD;  Location: [place];  Service: Open Heart Surgery;  Laterality: N/A;  . TOTAL HIP ARTHROPLASTY Left 05/13/2012   Procedure: TOTAL HIP ARTHROPLASTY ANTERIOR APPROACH;  Surgeon: Mauri Pole, MD;  Location: [facility];  Service: Orthopedics;  Laterality: Left;  . TUBAL LIGATION       Social History:  The patient  reports that she has never smoked. She has never used smokeless tobacco. She reports that she does not drink alcohol and does not use drugs.   Family History:  The patient's family history includes Anesthesia problems in her brother; Cancer in her brother and sister; Heart disease in her father; Kidney disease in her mother.    ROS:  Please see the history of present illness. All other systems are reviewed and  Negative to the above problem except as noted.    PHYSICAL EXAM: VS:  BP 128/82   Pulse 69   Ht 5' (1.524 m)   Wt 155 lb 12.8 oz (70.7 kg)   SpO2 97%   BMI 30.43 kg/m   GEN: Overweight 65 yo in no acute distress  HEENT: normal  Neck: no JVD, carotid bruits Cardiac: RRR; no murmurs   No LE  edema   Lipedema Chest  Wound healing well Respiratory:    Mild crackes anteriorly around sternum  Chest   Wound  well healed   GI: soft, nontender, nondistended, + BS  No hepatomegaly  MS: no deformity Moving all extremities   Skin: warm and dry, no rash Neuro:  Strength and sensation are intact Psych: euthymic mood, full affect   EKG:  EKG is not done today     Echo  04/25/20 1. Left ventricular ejection fraction, by estimation, is 60 to 65%. The left ventricle has normal function. The left ventricle has no regional wall motion abnormalities. Left ventricular diastolic parameters are consistent with Grade I diastolic dysfunction (impaired relaxation). 2. Right ventricular systolic function is normal. The right ventricular size is normal. There is normal pulmonary artery systolic pressure. The estimated right ventricular systolic pressure is 25.4 mmHg. 3. The mitral valve is normal in structure. Trivial mitral valve regurgitation. No evidence of mitral stenosis. 4. The aortic valve is tricuspid. Aortic valve regurgitation is not visualized. Mild aortic valve sclerosis is present, with no evidence of aortic valve stenosis. 5. Aortic dilatation noted. There is mild dilatation of the aortic root, measuring 38 mm. 6. The inferior vena cava is normal in size with greater than 50% respiratory variability, suggesting right atrial pressure of 3 mmHg.   Cardiac cath   04/25/20   Mid RCA lesion is 20% stenosed.  Mid LM to Dist LM lesion is 50% stenosed.  Ost LAD to Prox LAD lesion is 80% stenosed.  Ost Cx to Prox Cx lesion is 50% stenosed.  The left ventricular systolic function is normal.  LV end diastolic pressure is normal.  The left ventricular ejection fraction is 55-65% by visual estimate.  There is no mitral valve regurgitation.   1. Moderate distal left main stenosis 2. Severe disease in the ostium of the large caliber LAD extending into the proximal vessel.  3.  Moderate stenosis in the ostium of the large caliber Circumflex artery. The small to moderate caliber intermediate vessel has moderate proximal disease.  4. Mild non-obstructive disease in the mid RCA 5. Normal LV systolic function  R heart cath:   RA 0   RV 28/2  Mean PAP 13   PCWP 2     Echo  04/28/20  1. Poor acoustic windows limit study. Difficult to see endocardium. 2. Left ventricular ejection fraction, by estimation, is 35 to 40%. The left ventricle has moderately decreased function. The left ventricle demonstrates global hypokinesis. Left ventricular diastolic parameters are indeterminate. 3. Right ventricular systolic function is mildly reduced. The right ventricular size is normal. There is normal pulmonary artery systolic pressure. 4. The mitral valve is normal in structure. Mild mitral valve regurgitation. 5. The aortic valve is abnormal. Aortic valve regurgitation is not visualized. Mild aortic valve sclerosis is present, with no evidence of aortic valve stenosis.  Echo 07/15/20  1. Left ventricular ejection fraction, by estimation, is 45 to 50%. The left ventricle has mildly decreased function. The left ventricle has no regional wall motion abnormalities. There is mild concentric left ventricular hypertrophy. Left ventricular diastolic parameters are consistent with Grade I diastolic dysfunction (impaired relaxation). 2. Right ventricular systolic function is normal. The right ventricular size is normal. 3. The mitral valve is normal in structure. Trivial mitral valve regurgitation. 4. The aortic valve is tricuspid. Aortic valve regurgitation is not visualized. No aortic stenosis is present. 5. The inferior vena cava is normal in size with greater than 50% respiratory variability, suggesting right atrial pressure of 3 mmHg. Comparison(s): A prior study was performed on 04/28/2020. Prior images reviewed side by side. Improvement  in LVEF. Lipid Panel    Component Value  Date/Time   CHOL 160 04/27/2020 0306   CHOL 231 (H) 04/18/2020 1641   TRIG 136 04/27/2020 0306   HDL 40 (L) 04/27/2020 0306   HDL 45 04/18/2020 1641   CHOLHDL 4.0 04/27/2020 0306   VLDL 27 04/27/2020 0306   LDLCALC 93 04/27/2020 0306   LDLCALC 136 (H) 04/18/2020 1641      Wt Readings from Last 3 Encounters:  08/02/20 155 lb 12.8 oz (70.7 kg)  06/14/20 151 lb (68.5 kg)  05/18/20 153 lb (69.4 kg)      ASSESSMENT AND PLAN:  1  CAD  Pt is now 3 months post CABG   Doing very well   Increasing activity  Echo in April showed improvement in LVEF   Not fully normalized Recomm:   Would cotntinue   OK to get back into yard work   OK to drive     Cut metoprolol to 25 XL daily (she said she is losing hair)   Keep on ASA  2  HTN   Follow BP as taper back on metoprolol  May need a low dose amlodipine or ARB  3  Afib    She has not felt any heart racing   Sensed that last time she was in in earlier this spring (first part of monitor)   I feel it is OK to stop Eliquis     Follow clinically   5  HL   Will check lipids   LDL needs tighter control if still above 80  6   Pulmonary   Will review records   Pt right now does not want anything done   I need to review  She does have rales anteriorly  Check BNP    Check ESR  7  Thyroid   Levels abnormal in Feb   Will check        F/U in September 2022   Current medicines are reviewed at length with the patient today.  The patient does not have concerns regarding medicines.  Signed, Dorris Carnes, MD  08/02/2020 8:35 AM    [facility] [place], [place], [place]  [address] Phone: [phone]; Fax: [phone]

## 2020-08-02 ENCOUNTER — Ambulatory Visit (INDEPENDENT_AMBULATORY_CARE_PROVIDER_SITE_OTHER): Payer: Medicare Other | Admitting: Internal Medicine

## 2020-08-02 ENCOUNTER — Other Ambulatory Visit: Payer: Self-pay

## 2020-08-02 ENCOUNTER — Encounter: Payer: Self-pay | Admitting: Internal Medicine

## 2020-08-02 VITALS — BP 128/82 | HR 69 | Ht 60.0 in | Wt 155.8 lb

## 2020-08-02 DIAGNOSIS — I4891 Unspecified atrial fibrillation: Secondary | ICD-10-CM

## 2020-08-02 DIAGNOSIS — E782 Mixed hyperlipidemia: Secondary | ICD-10-CM

## 2020-08-02 DIAGNOSIS — R0602 Shortness of breath: Secondary | ICD-10-CM | POA: Diagnosis not present

## 2020-08-02 DIAGNOSIS — I259 Chronic ischemic heart disease, unspecified: Secondary | ICD-10-CM

## 2020-08-02 MED ORDER — METOPROLOL SUCCINATE ER 25 MG PO TB24: 25.0000 mg | ORAL_TABLET | Freq: Every day | ORAL | 3 refills | Status: DC

## 2020-08-02 NOTE — Patient Instructions (Addendum)
Medication Instructions:  Your physician has recommended you make the following change in your medication:  1.) stop metoprolol tartrate (Lopressor) 2.) start metoprolol succinate (Toprol XL) 25 mg -DAILY  *If you need a refill on your cardiac medications before your next appointment, please call your pharmacy*   Lab Work: Today: lipids, cbc, bnp, sed rate, tsh, free T3 free T4, bmet  If you have labs (blood work) drawn today and your tests are completely normal, you will receive your results only by: Marland Kitchen MyChart Message (if you have MyChart) OR . A paper copy in the mail If you have any lab test that is abnormal or we need to change your treatment, we will call you to review the results.   Testing/Procedures: none   Follow-Up: At [facility], you and your health needs are our priority.  As part of our continuing mission to provide you with exceptional heart care, we have created designated Provider Care Teams.  These Care Teams include your primary Cardiologist (physician) and Advanced Practice Providers (APPs -  Physician Assistants and Nurse Practitioners) who all work together to provide you with the care you need, when you need it.   Your next appointment:   4 month(s)  The format for your next appointment:   In Person  Provider:   You may see Dietrich Pates, MD or one of the following Advanced Practice Providers on your designated Care Team:    Tereso Newcomer, PA-C  Vin Edgar, [place]

## 2020-08-03 LAB — T3, FREE: T3, Free: 2.8 pg/mL (ref 2.0–4.4)

## 2020-08-03 LAB — CBC
Hematocrit: 37.9 % (ref 34.0–46.6)
Hemoglobin: 12.5 g/dL (ref 11.1–15.9)
MCH: 30.2 pg (ref 26.6–33.0)
MCHC: 33 g/dL (ref 31.5–35.7)
MCV: 92 fL (ref 79–97)
Platelets: 313 10*3/uL (ref 150–450)
RBC: 4.14 x10E6/uL (ref 3.77–5.28)
RDW: 12.6 % (ref 11.7–15.4)
WBC: 8.2 10*3/uL (ref 3.4–10.8)

## 2020-08-03 LAB — SEDIMENTATION RATE: Sed Rate: 33 mm/hr (ref 0–40)

## 2020-08-03 LAB — LIPID PANEL
Chol/HDL Ratio: 2.5 ratio (ref 0.0–4.4)
Cholesterol, Total: 115 mg/dL (ref 100–199)
HDL: 46 mg/dL (ref >39)
LDL Chol Calc (NIH): 48 mg/dL (ref 0–99)
Triglycerides: 114 mg/dL (ref 0–149)
VLDL Cholesterol Cal: 21 mg/dL (ref 5–40)

## 2020-08-03 LAB — TSH: TSH: 3.52 u[IU]/mL (ref 0.450–4.500)

## 2020-08-03 LAB — PRO B NATRIURETIC PEPTIDE: NT-Pro BNP: 319 pg/mL — ABNORMAL HIGH (ref 0–301)

## 2020-08-03 LAB — T4, FREE: Free T4: 1.37 ng/dL (ref 0.82–1.77)

## 2020-08-08 ENCOUNTER — Telehealth (HOSPITAL_COMMUNITY): Payer: Self-pay

## 2020-08-08 NOTE — Telephone Encounter (Signed)
No response from pt.  Closed referral  

## 2020-08-18 NOTE — Telephone Encounter (Signed)
Last ov 08/02/20 with Dr. Tenny Craw:  2  HTN   Follow BP as taper back on metoprolol  May need a low dose amlodipine or ARB  Will route to MD for recommendations.

## 2020-08-19 MED ORDER — AMLODIPINE BESYLATE 2.5 MG PO TABS: 2.5000 mg | ORAL_TABLET | Freq: Every day | ORAL | 3 refills | Status: DC

## 2020-08-19 NOTE — Telephone Encounter (Signed)
Amanda Dade, MD  You 10 hours ago (10:39 PM)   I would recomm increasing amlodipine 2.5 mg bid. Keep following BP    __________________________________________________________  Pt has not been using amlodipine except for a couple times when BP has been >170.  This does help her BP come down.  She will start taking this every day for now.  She will keep track of BP and send in readings in about 10-14 days, at that time will re eval and then go to BID if needed.  She is feeling a lot of family stress currently and feels this is contributing.

## 2020-08-28 ENCOUNTER — Other Ambulatory Visit: Payer: Self-pay | Admitting: Physician Assistant

## 2020-08-31 DIAGNOSIS — I259 Chronic ischemic heart disease, unspecified: Secondary | ICD-10-CM

## 2020-08-31 DIAGNOSIS — Z951 Presence of aortocoronary bypass graft: Secondary | ICD-10-CM

## 2020-09-02 ENCOUNTER — Other Ambulatory Visit: Payer: Self-pay | Admitting: Internal Medicine

## 2020-09-06 MED ORDER — ENTRESTO 24-26 MG PO TABS: 1.0000 | ORAL_TABLET | Freq: Two times a day (BID) | ORAL | 5 refills | Status: DC

## 2020-09-06 NOTE — Telephone Encounter (Signed)
After review of her recent of her record/ progress since surgery and recent BP readings  I would recomm stopping amlodipine   Instead use Entresto 24/26   This will help BP more  Continue to assist heart in its recovery  Follow BMET in 10 days   I will send msg to patient to explain as well   Keep tabs of BP at home

## 2020-09-06 NOTE — Telephone Encounter (Signed)
Spoke w patient. She will stop amlodipine and begin Entresto 24-26 mg BID.  Will come to office lab for BMET on 09/16/20.  Will monitor BP at home.

## 2020-09-07 ENCOUNTER — Telehealth: Payer: Self-pay

## 2020-09-07 NOTE — Telephone Encounter (Signed)
**Note De-Identified Arjuna Doeden Obfuscation** I stated a Entresto PA through covermymeds. Key: [id number]

## 2020-09-09 NOTE — Telephone Encounter (Signed)
I have resubmitted the prior authorization using systolic heart failure as the indication and using her echo from Feb that showed an EF of 35-40 for documentation.  Im not sure what the indication was submitted for initially. HF is not documented in problem list, but she clearly has it. Waiting on insurance response

## 2020-09-09 NOTE — Telephone Encounter (Signed)
**Note De-Identified Anessa Charley Obfuscation** Letter received Rielly Brunn fax from [employer] stating that they denied the pts [employer] PA. Reason for denial: The use of Entresto for any other reason than heart failure is considered experimental or investigational use for the drug.  Per Echo from 07/15/20 her EF is 45-50% (in most cases ins plans require a EF less than or equal to 40% for approval of Entresto  I will forward this message to Dr Tenny Craw and her nurse for advisement to the pt.

## 2020-09-12 NOTE — Telephone Encounter (Signed)
PA for [name] has been approved through 09/09/2022

## 2020-09-13 NOTE — Telephone Encounter (Signed)
Patient has been informed that the insurance authorized for [employer].  She will go to pharmacy to see what cost will be and to use 30 day free trial.  She knows that she needs to get labs 10 days after starting.   She will call back to schedule her lab appointment.  I cancelled the lab visit for this week.

## 2020-09-13 NOTE — Telephone Encounter (Signed)
[name] has been approved by insurance   I would reomm she start it

## 2020-09-16 ENCOUNTER — Other Ambulatory Visit: Payer: Medicare Other

## 2020-09-29 ENCOUNTER — Other Ambulatory Visit: Payer: Medicare Other

## 2020-09-29 ENCOUNTER — Other Ambulatory Visit: Payer: Self-pay

## 2020-09-29 DIAGNOSIS — Z951 Presence of aortocoronary bypass graft: Secondary | ICD-10-CM

## 2020-09-29 DIAGNOSIS — I259 Chronic ischemic heart disease, unspecified: Secondary | ICD-10-CM

## 2020-09-29 LAB — BASIC METABOLIC PANEL
BUN/Creatinine Ratio: 14 (ref 12–28)
BUN: 13 mg/dL (ref 8–27)
CO2: 19 mmol/L — ABNORMAL LOW (ref 20–29)
Calcium: 10.7 mg/dL — ABNORMAL HIGH (ref 8.7–10.3)
Chloride: 104 mmol/L (ref 96–106)
Creatinine, Ser: 0.92 mg/dL (ref 0.57–1.00)
Glucose: 116 mg/dL — ABNORMAL HIGH (ref 65–99)
Potassium: 4 mmol/L (ref 3.5–5.2)
Sodium: 139 mmol/L (ref 134–144)
eGFR: 69 mL/min/{1.73_m2} (ref >59)
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
## 2020-12-15 ENCOUNTER — Ambulatory Visit (INDEPENDENT_AMBULATORY_CARE_PROVIDER_SITE_OTHER): Payer: Medicare Other | Admitting: Internal Medicine

## 2020-12-15 ENCOUNTER — Other Ambulatory Visit: Payer: Self-pay

## 2020-12-15 ENCOUNTER — Encounter: Payer: Self-pay | Admitting: Internal Medicine

## 2020-12-15 VITALS — BP 140/90 | HR 78 | Ht 60.0 in | Wt 159.8 lb

## 2020-12-15 DIAGNOSIS — I259 Chronic ischemic heart disease, unspecified: Secondary | ICD-10-CM | POA: Diagnosis not present

## 2020-12-15 DIAGNOSIS — I2510 Atherosclerotic heart disease of native coronary artery without angina pectoris: Secondary | ICD-10-CM | POA: Diagnosis not present

## 2020-12-15 MED ORDER — FUROSEMIDE 40 MG PO TABS: ORAL_TABLET | ORAL | 0 refills | Status: AC

## 2020-12-15 MED ORDER — POTASSIUM CHLORIDE CRYS ER 20 MEQ PO TBCR: EXTENDED_RELEASE_TABLET | ORAL | 0 refills | Status: DC

## 2020-12-15 NOTE — Patient Instructions (Signed)
Medication Instructions:  Your physician recommends that you continue on your current medications as directed. Please refer to the Current Medication list given to you today. Take furosemide 40 mg and potassium 20 meq by mouth as directed *If you need a refill on your cardiac medications before your next appointment, please call your pharmacy*   Lab Work: none If you have labs (blood work) drawn today and your tests are completely normal, you will receive your results only by: MyChart Message (if you have MyChart) OR A paper copy in the mail If you have any lab test that is abnormal or we need to change your treatment, we will call you to review the results.   Testing/Procedures: none   Follow-Up: At [facility], you and your health needs are our priority.  As part of our continuing mission to provide you with exceptional heart care, we have created designated Provider Care Teams.  These Care Teams include your primary Cardiologist (physician) and Advanced Practice Providers (APPs -  Physician Assistants and Nurse Practitioners) who all work together to provide you with the care you need, when you need it.  We recommend signing up for the patient portal called "MyChart".  Sign up information is provided on this After Visit Summary.  MyChart is used to connect with patients for Virtual Visits (Telemedicine).  Patients are able to view lab/test results, encounter notes, upcoming appointments, etc.  Non-urgent messages can be sent to your provider as well.   To learn more about what you can do with MyChart, go to [email or web address].    Your next appointment:   March 21,2023 at 1:20  The format for your next appointment:   In Person  Provider:   Dietrich Pates, MD   Other Instructions

## 2021-02-25 ENCOUNTER — Other Ambulatory Visit: Payer: Self-pay | Admitting: Internal Medicine

## 2021-02-27 MED ORDER — ENTRESTO 24-26 MG PO TABS: 1.0000 | ORAL_TABLET | Freq: Two times a day (BID) | ORAL | 9 refills | Status: DC

## 2021-03-01 ENCOUNTER — Telehealth: Payer: Self-pay | Admitting: Internal Medicine

## 2021-03-01 MED ORDER — ENTRESTO 24-26 MG PO TABS: 1.0000 | ORAL_TABLET | Freq: Two times a day (BID) | ORAL | 3 refills | Status: DC

## 2021-03-01 NOTE — Telephone Encounter (Signed)
I called [facility]. They said that patient just picked up Entresto on 11/30 The cost of $800 was bc the rx was too soon and it was a cash price. They did assure me that the copay card was linked to new Rx. But I did send in another new Rx for a 90 day supply since the copay card is 10$ for 30 or 90 days.

## 2021-03-01 NOTE — Telephone Encounter (Signed)
Pt c/o medication issue:  1. Name of Medication: sacubitril-valsartan (ENTRESTO) 24-26 MG  2. How are you currently taking this medication (dosage and times per day)? As directed  3. Are you having a reaction (difficulty breathing--STAT)?   4. What is your medication issue? Cost. Patient said her copay went up to over $800. She wanted to know what medicine she could go on that would be more affordable for her

## 2021-03-01 NOTE — Telephone Encounter (Deleted)
She has Nurse, learning disability, her $10 copay card should continue to work for [employer].

## 2021-03-01 NOTE — Telephone Encounter (Addendum)
**Note De-Identified [name] Obfuscation** The pt states that she has 2 weeks of Entresto on hand but needs samples because the cost of her [name] has gone way up and she cannot afford.   I advised her that we will leave her a 2 week supply of Entresto samples in the front office at Dr Charlott Rakes office for her to pick up.   Also, she wants to know if she will continue to get the $10 co-pay discount that she has been getting.   I advised her that I am unsure what she is asking concerning a discount card but according to phone note from 09/07/20 her [name] was approved for coverage until 06/24.   She states that the discount did not involve her ins at all. I advised her that I am forwarding this message to our pharmacist who assisted her with [name] in June.
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
## 2021-06-13 ENCOUNTER — Telehealth: Payer: Self-pay

## 2021-06-13 ENCOUNTER — Other Ambulatory Visit: Payer: Self-pay

## 2021-06-13 ENCOUNTER — Encounter: Payer: Self-pay | Admitting: Internal Medicine

## 2021-06-13 ENCOUNTER — Ambulatory Visit (INDEPENDENT_AMBULATORY_CARE_PROVIDER_SITE_OTHER): Payer: Medicare Other | Admitting: Internal Medicine

## 2021-06-13 VITALS — BP 120/78 | HR 87 | Ht 60.0 in | Wt 159.0 lb

## 2021-06-13 DIAGNOSIS — E782 Mixed hyperlipidemia: Secondary | ICD-10-CM

## 2021-06-13 DIAGNOSIS — Z951 Presence of aortocoronary bypass graft: Secondary | ICD-10-CM

## 2021-06-13 DIAGNOSIS — I2510 Atherosclerotic heart disease of native coronary artery without angina pectoris: Secondary | ICD-10-CM

## 2021-06-13 DIAGNOSIS — R0602 Shortness of breath: Secondary | ICD-10-CM | POA: Diagnosis not present

## 2021-06-13 DIAGNOSIS — Z79899 Other long term (current) drug therapy: Secondary | ICD-10-CM

## 2021-06-13 NOTE — Patient Instructions (Signed)
Medication Instructions:  ?Your physician recommends that you continue on your current medications as directed. Please refer to the Current Medication list given to you today. ? ?*If you need a refill on your cardiac medications before your next appointment, please call your pharmacy* ? ? ?Lab Work: ?CBC, CMET, HGBA1C, LIPID, TSH  ?If you have labs (blood work) drawn today and your tests are completely normal, you will receive your results only by: ?MyChart Message (if you have MyChart) OR ?A paper copy in the mail ?If you have any lab test that is abnormal or we need to change your treatment, we will call you to review the results. ? ? ?Testing/Procedures: ?NONE ? ? ?Follow-Up: ?At [facility], you and your health needs are our priority.  As part of our continuing mission to provide you with exceptional heart care, we have created designated Provider Care Teams.  These Care Teams include your primary Cardiologist (physician) and Advanced Practice Providers (APPs -  Physician Assistants and Nurse Practitioners) who all work together to provide you with the care you need, when you need it. ? ?We recommend signing up for the patient portal called "MyChart".  Sign up information is provided on this After Visit Summary.  MyChart is used to connect with patients for Virtual Visits (Telemedicine).  Patients are able to view lab/test results, encounter notes, upcoming appointments, etc.  Non-urgent messages can be sent to your provider as well.   ?To learn more about what you can do with MyChart, go to [email or web address].   ? ?Your next appointment:   ?8 month(s) ? ?The format for your next appointment:   ?In Person ? ?Provider:   ?Dietrich Pates, MD   ? ? ?Other Instructions ?  ?

## 2021-06-14 LAB — CBC
Hematocrit: 37.4 % (ref 34.0–46.6)
Hemoglobin: 12.7 g/dL (ref 11.1–15.9)
MCH: 31.1 pg (ref 26.6–33.0)
MCHC: 34 g/dL (ref 31.5–35.7)
MCV: 92 fL (ref 79–97)
Platelets: 317 10*3/uL (ref 150–450)
RBC: 4.08 x10E6/uL (ref 3.77–5.28)
RDW: 13 % (ref 11.7–15.4)
WBC: 9.7 10*3/uL (ref 3.4–10.8)

## 2021-06-14 LAB — COMPREHENSIVE METABOLIC PANEL
ALT: 11 IU/L (ref 0–32)
AST: 19 IU/L (ref 0–40)
Albumin/Globulin Ratio: 1.5 (ref 1.2–2.2)
Albumin: 4.5 g/dL (ref 3.8–4.8)
Alkaline Phosphatase: 92 IU/L (ref 44–121)
BUN/Creatinine Ratio: 24 (ref 12–28)
BUN: 22 mg/dL (ref 8–27)
Bilirubin Total: 0.7 mg/dL (ref 0.0–1.2)
CO2: 18 mmol/L — ABNORMAL LOW (ref 20–29)
Calcium: 10.8 mg/dL — ABNORMAL HIGH (ref 8.7–10.3)
Chloride: 105 mmol/L (ref 96–106)
Creatinine, Ser: 0.92 mg/dL (ref 0.57–1.00)
Globulin, Total: 3 g/dL (ref 1.5–4.5)
Glucose: 109 mg/dL — ABNORMAL HIGH (ref 70–99)
Potassium: 4.4 mmol/L (ref 3.5–5.2)
Sodium: 138 mmol/L (ref 134–144)
Total Protein: 7.5 g/dL (ref 6.0–8.5)
eGFR: 69 mL/min/{1.73_m2} (ref >59)

## 2021-06-14 LAB — HEMOGLOBIN A1C
Est. average glucose Bld gHb Est-mCnc: 140 mg/dL
Hgb A1c MFr Bld: 6.5 % — ABNORMAL HIGH (ref 4.8–5.6)

## 2021-06-14 LAB — LIPID PANEL
Chol/HDL Ratio: 2.8 ratio (ref 0.0–4.4)
Cholesterol, Total: 134 mg/dL (ref 100–199)
HDL: 48 mg/dL (ref >39)
LDL Chol Calc (NIH): 59 mg/dL (ref 0–99)
Triglycerides: 160 mg/dL — ABNORMAL HIGH (ref 0–149)
VLDL Cholesterol Cal: 27 mg/dL (ref 5–40)

## 2021-06-14 LAB — TSH: TSH: 3.04 u[IU]/mL (ref 0.450–4.500)

## 2021-06-17 ENCOUNTER — Encounter: Payer: Self-pay | Admitting: Internal Medicine

## 2021-06-17 DIAGNOSIS — Z79899 Other long term (current) drug therapy: Secondary | ICD-10-CM

## 2021-06-23 LAB — PTH, INTACT AND CALCIUM
Calcium: 10.8 mg/dL — ABNORMAL HIGH (ref 8.7–10.3)
PTH: 42 pg/mL (ref 15–65)

## 2021-06-23 LAB — VITAMIN D 25 HYDROXY (VIT D DEFICIENCY, FRACTURES): Vit D, 25-Hydroxy: 34.4 ng/mL (ref 30.0–100.0)

## 2021-07-31 ENCOUNTER — Other Ambulatory Visit: Payer: Self-pay

## 2021-07-31 MED ORDER — METOPROLOL SUCCINATE ER 25 MG PO TB24: 25.0000 mg | ORAL_TABLET | Freq: Every day | ORAL | 1 refills | Status: DC

## 2021-09-04 ENCOUNTER — Other Ambulatory Visit: Payer: Self-pay

## 2021-09-04 MED ORDER — ROSUVASTATIN CALCIUM 20 MG PO TABS: ORAL_TABLET | ORAL | 10 refills | Status: DC

## 2021-10-18 IMAGING — DX DG CHEST 1V PORT
1 series · 1 of 1 positions shown · non-contrast
Comparison: April 27, 2020.

CLINICAL DATA: Status post CABG.

EXAM:
PORTABLE CHEST 1 VIEW

[chest ap]
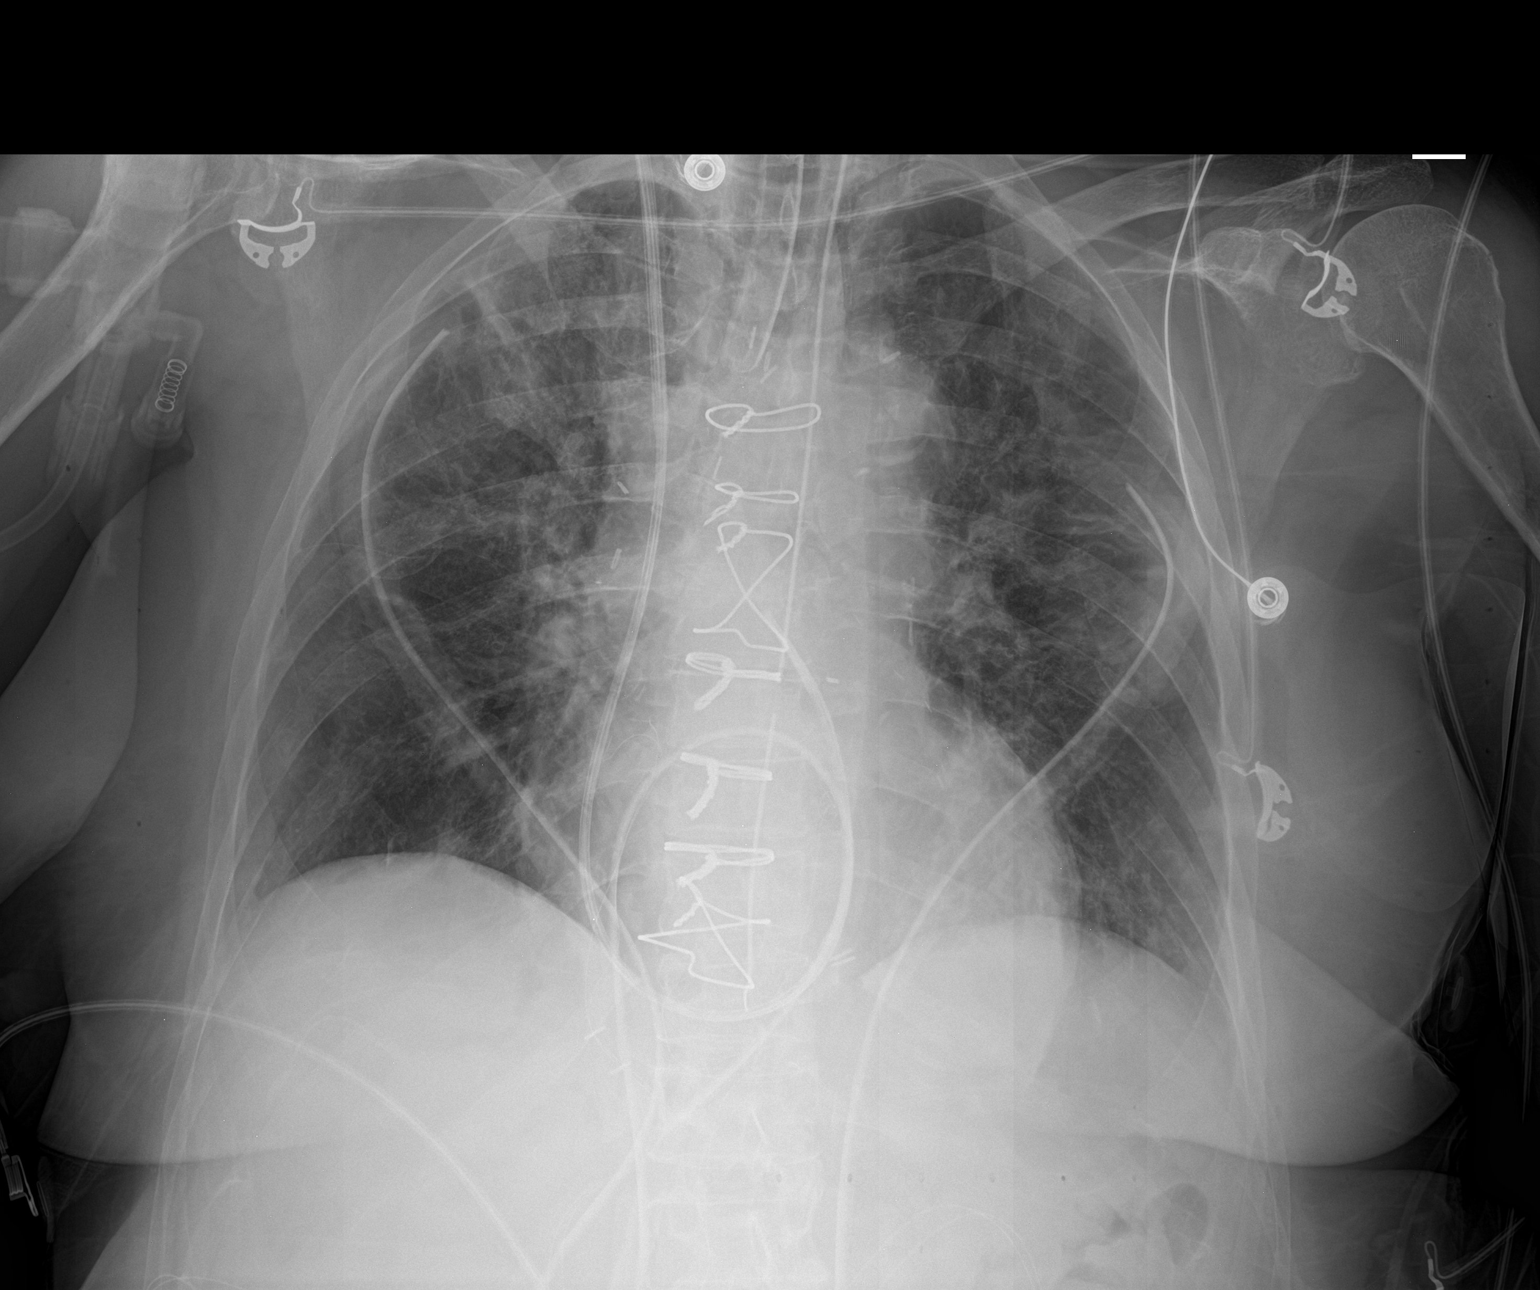

[1 of 1 positions shown; findings below may reference images not displayed]

FINDINGS: Endotracheal tube tip at the level of clavicular heads. Swan-Ganz
catheter with the tip projecting in the expected region of the main
pulmonary artery. Gastric tube tip is just proximal to the GE
junction. Bilateral thoracostomy tubes with tips projecting at the
mid upper lateral chest bilaterally. Mediastinal drain. Bilateral
upper lung interstitial prominence. No visible pleural effusions or
pneumothorax. Cardiac silhouette is within normal limits.
Postsurgical changes of CABG with median sternotomy.
IMPRESSION: 1. Support devices as detailed above. Gastric tube tip projects
immediately superior to the gastroesophageal junction. If
intragastric positioning is desired, recommend advancement.
2. Bilateral upper lung interstitial prominence, possibly edema
and/or atelectasis in the immediate postoperative setting.

## 2021-10-30 IMAGING — CR DG CHEST 2V
2 series · 2 of 2 positions shown · non-contrast
Comparison: 04/29/2020

CLINICAL DATA: Shortness of breath, chest pain

EXAM:
CHEST - 2 VIEW

[w chest pa]
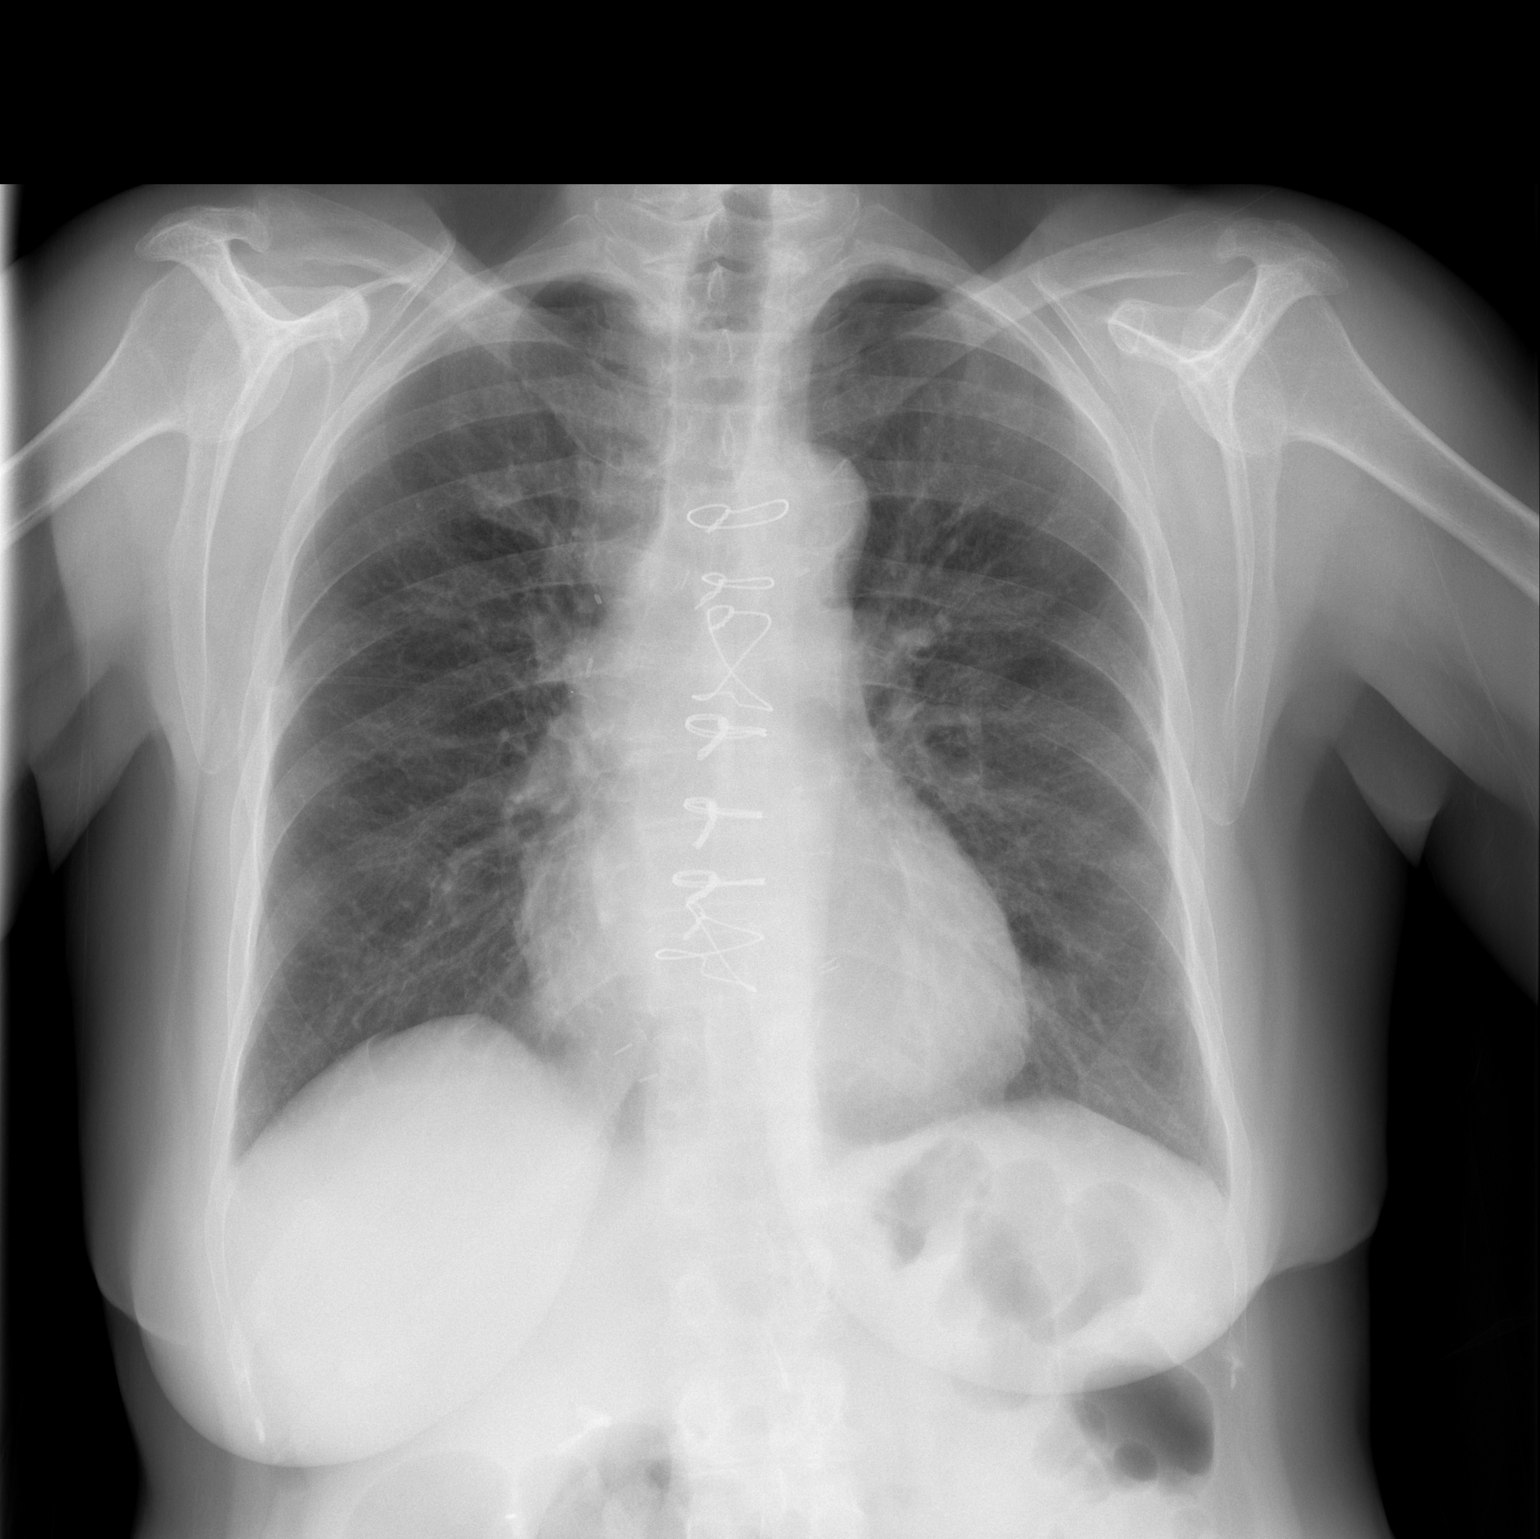

[w chest lat]
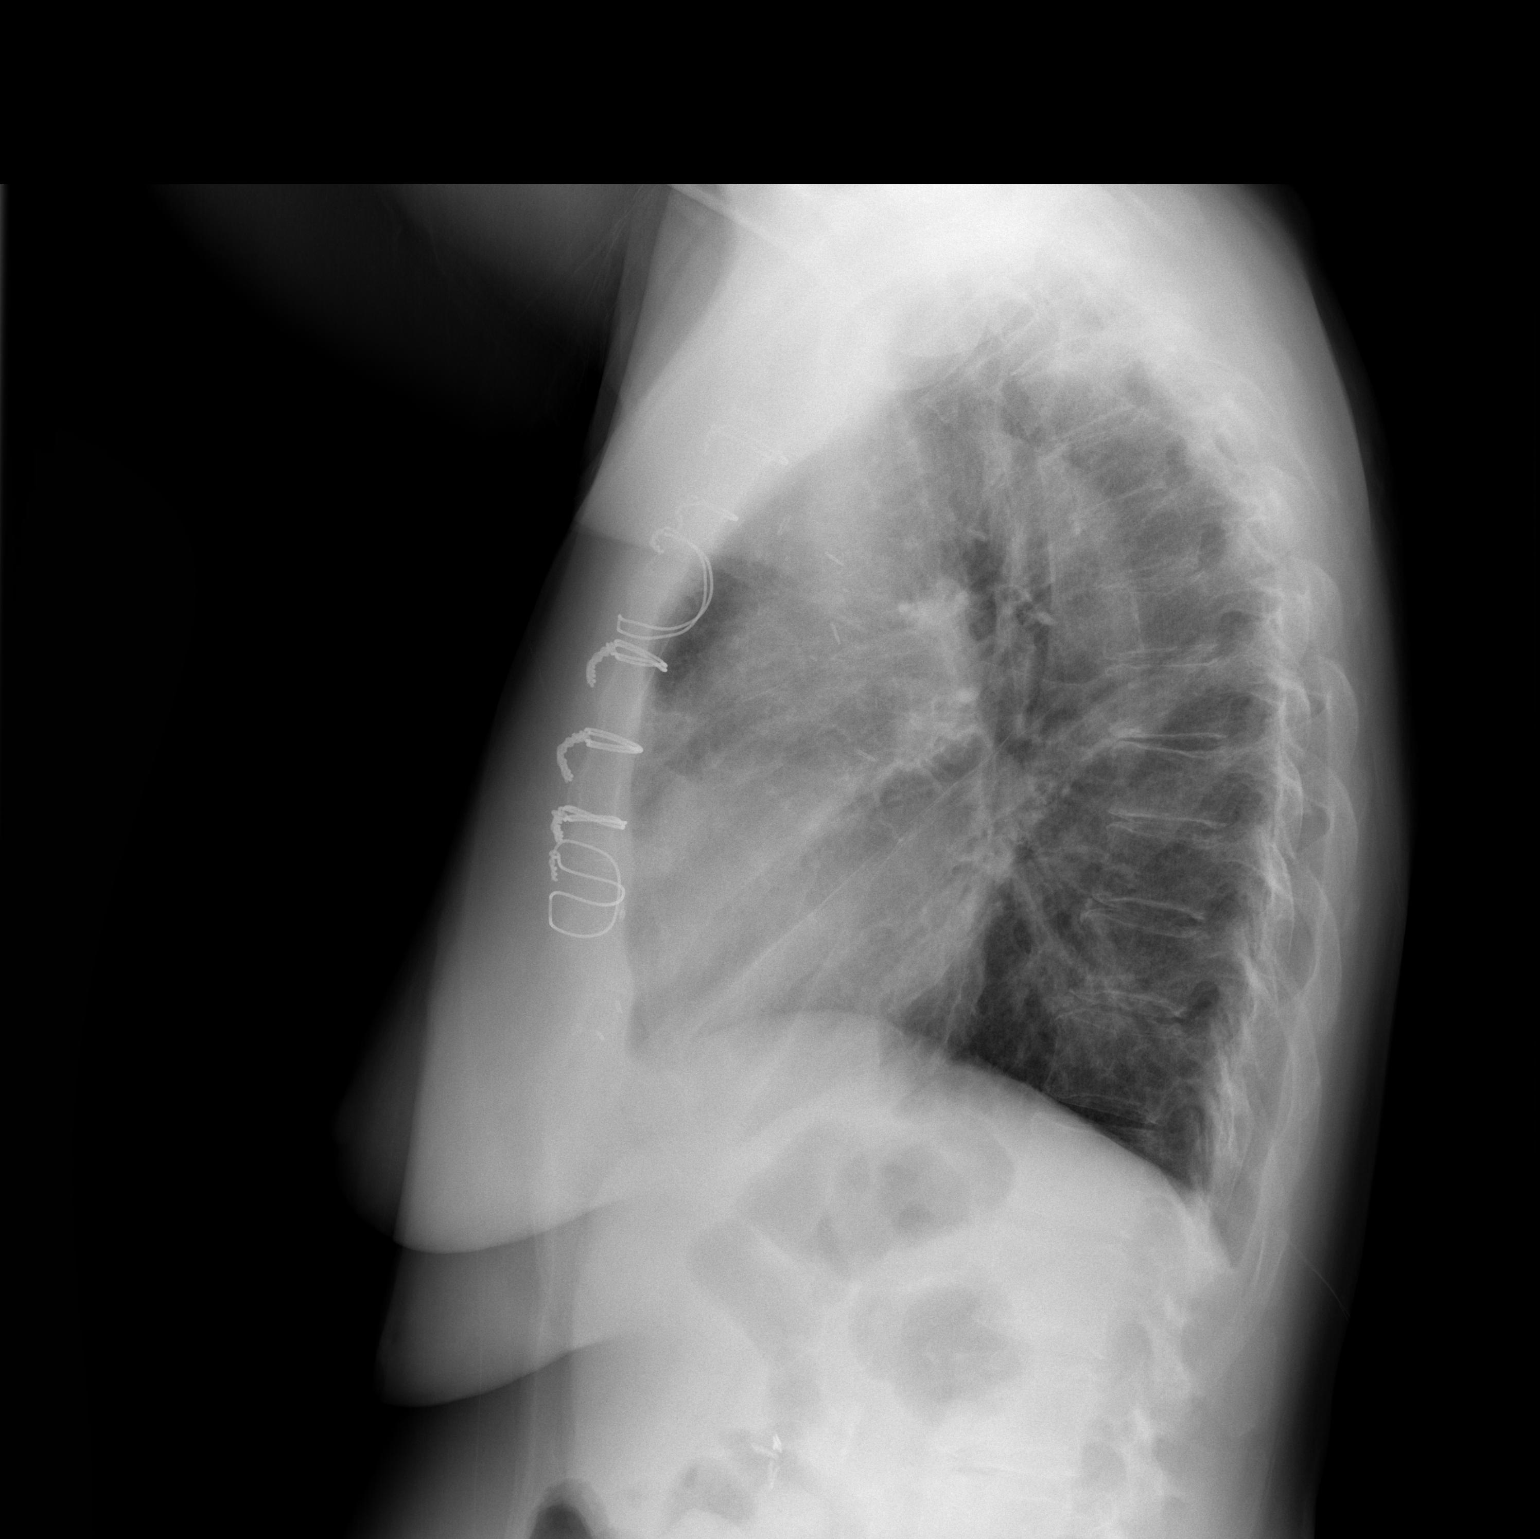

[2 of 2 positions shown; findings below may reference images not displayed]

FINDINGS: Prior median sternotomy. Heart and mediastinal contours are within
normal limits. No focal opacities or effusions. No acute bony
abnormality.
IMPRESSION: No active cardiopulmonary disease.

## 2021-11-11 ENCOUNTER — Other Ambulatory Visit: Payer: Self-pay | Admitting: Internal Medicine

## 2022-02-07 ENCOUNTER — Ambulatory Visit: Payer: Medicare Other | Attending: Internal Medicine | Admitting: Internal Medicine

## 2022-02-07 ENCOUNTER — Encounter: Payer: Self-pay | Admitting: Internal Medicine

## 2022-02-07 VITALS — BP 128/78 | HR 87 | Ht 60.0 in | Wt 160.0 lb

## 2022-02-07 DIAGNOSIS — Z79899 Other long term (current) drug therapy: Secondary | ICD-10-CM

## 2022-02-07 DIAGNOSIS — R0602 Shortness of breath: Secondary | ICD-10-CM

## 2022-02-07 DIAGNOSIS — I2510 Atherosclerotic heart disease of native coronary artery without angina pectoris: Secondary | ICD-10-CM

## 2022-02-07 MED ORDER — METOPROLOL SUCCINATE ER 25 MG PO TB24: ORAL_TABLET | ORAL | 3 refills | Status: DC

## 2022-02-07 MED ORDER — METOPROLOL SUCCINATE ER 25 MG PO TB24: ORAL_TABLET | ORAL | 1 refills | Status: DC

## 2022-02-07 NOTE — Patient Instructions (Signed)
Medication Instructions:   *If you need a refill on your cardiac medications before your next appointment, please call your pharmacy*   Lab Work: BMET AND PRO BNP TODAY   If you have labs (blood work) drawn today and your tests are completely normal, you will receive your results only by: MyChart Message (if you have MyChart) OR A paper copy in the mail If you have any lab test that is abnormal or we need to change your treatment, we will call you to review the results.   Testing/Procedures: Chest CT    Follow-Up: At [facility], you and your health needs are our priority.  As part of our continuing mission to provide you with exceptional heart care, we have created designated Provider Care Teams.  These Care Teams include your primary Cardiologist (physician) and Advanced Practice Providers (APPs -  Physician Assistants and Nurse Practitioners) who all work together to provide you with the care you need, when you need it.  We recommend signing up for the patient portal called "MyChart".  Sign up information is provided on this After Visit Summary.  MyChart is used to connect with patients for Virtual Visits (Telemedicine).  Patients are able to view lab/test results, encounter notes, upcoming appointments, etc.  Non-urgent messages can be sent to your provider as well.   To learn more about what you can do with MyChart, go to [email or web address].     Important Information About Sugar

## 2022-02-07 NOTE — Progress Notes (Signed)
Cardiology Office Note   Date:  02/07/2022   ID:  Illene, Sweeting Jun 14, 1955, MRN [id number]  PCP:  Deloris Ping, MD  Cardiologist:   Dietrich Pates, MD   Pt returns for f/u of CAD     History of Present Illness: [name] is a 66 y.o. female with a history of PMR,  HTN, and OA    She began complaining of coughing, SOB in 2020    R sided CP   Seen at [facility].   CT showed scarring and inflamm lungs  O2 sats 88%   ? Hypersensitivity pneumonitis Spirometry showed decreased FEV1 and FVC The pt was seen at [facility] in Nov 2021 by Dr Candie Echevaria   CT of chest there showed bilateral diffuse mosaic  pattern,  ? Pneumonitis  Note CT also showed moderate coronary calcificaitons   Bronchoscopy planned.    The pt was seen in IM on 04/04/20   Work up for ILD and pneuonitis.   BP was found to be severely elevated  Pt was placed on lisinopril/ HCTZ   She had been on lisinopril in the past   Complicated by cough    I saw her in Jan 2022   She complained of chest pressure with activity Given CT findings of CAD,   I set the pt up for a R and L heart cath    See below The pt went on to have CABG x 2 (LIMA to LAD; RIMA to OM)   Post op complicated by Afib with RVR  Rx with amiodarone and Eliqus    After d/c amiodarone was stopped due to bradycardia and poor appetite.  I sawa the pt in clinic in Spring 2023    She says she has had increased SOB with exertion   Moving slow   Gives out easy    Can hear herself breathe Denies CP   No palpitations    Current Meds  Medication Sig   amoxicillin (AMOXIL) 500 MG capsule Take 500 mg by mouth as directed. Before dental aapointments   aspirin EC 81 MG tablet Take 1 tablet (81 mg total) by mouth daily. Swallow whole.   Cholecalciferol 25 MCG (1000 UT) tablet Take 1,000 Units by mouth in the morning and at bedtime.   cyclobenzaprine (FLEXERIL) 10 MG tablet Take 10 mg by mouth 3 (three) times daily as needed (Pain).   diclofenac Sodium (VOLTAREN) 1 % GEL Apply 2 g  topically daily as needed (Join pain).   diphenhydrAMINE (BENADRYL) 25 mg capsule Take 50 mg by mouth at bedtime as needed for sleep (drainage).   furosemide (LASIX) 40 MG tablet Take one tablet by mouth as directed   gabapentin (NEURONTIN) 100 MG capsule Take 100-200 mg by mouth at bedtime as needed (Restless leg).   metoprolol succinate (TOPROL-XL) 25 MG 24 hr tablet TAKE 1 TABLET(25 MG) BY MOUTH DAILY   Oxycodone HCl 10 MG TABS Take 10 mg by mouth 4 (four) times daily as needed (Pain).   promethazine (PHENERGAN) 25 MG tablet Take 25 mg by mouth 4 (four) times daily as needed for nausea or vomiting.   rosuvastatin (CRESTOR) 20 MG tablet TAKE 1 TABLET(20 MG) BY MOUTH DAILY   sacubitril-valsartan (ENTRESTO) 24-26 MG Take 1 tablet by mouth 2 (two) times daily.     Allergies:   Imitrex [sumatriptan base]   Past Medical History:  Diagnosis Date   Anemia    HX IRON INFUSION   Arthritis  Blood transfusion 10/2010   Chronic back pain    Coronary artery disease    GERD (gastroesophageal reflux disease)    Headache(784.0)    migraines   Hyperlipidemia    Hypertension    under control, has been on med. since 2001   Osteopenia    Polymyalgia rheumatica (HCC)    PONV (postoperative nausea and vomiting)    Snores    denies apnea   Spinal stenosis     Past Surgical History:  Procedure Laterality Date   APPENDECTOMY     BACK SURGERY  1990, 1992   L4-5   CHOLECYSTECTOMY  08/17/2011   Procedure: LAPAROSCOPIC CHOLECYSTECTOMY WITH INTRAOPERATIVE CHOLANGIOGRAM;  Surgeon: Ernestene MentionHaywood M Ingram, MD;  Location: [facility];  Service: General;  Laterality: N/A;  Laparoscopic cholecystectomy with cholangiogram   CORONARY ARTERY BYPASS GRAFT N/A 04/27/2020   Procedure: CORONARY ARTERY BYPASS GRAFTING (CABG) TIMES TWO USING BILATERAL INTERNAL MAMMARY ARTERIES;  Surgeon: Linden DolinAtkins, Broadus Z, MD;  Location: [facility] OR;  Service: Open Heart Surgery;  Laterality: N/A;  BIMA   JOINT REPLACEMENT   11/14/2010   right hip   RIGHT/LEFT HEART CATH AND CORONARY ANGIOGRAPHY N/A 04/25/2020   Procedure: RIGHT/LEFT HEART CATH AND CORONARY ANGIOGRAPHY;  Surgeon: Kathleene HazelMcAlhany, Christopher D, MD;  Location: [facility];  Service: Cardiovascular;  Laterality: N/A;   TEE WITHOUT CARDIOVERSION N/A 04/27/2020   Procedure: TRANSESOPHAGEAL ECHOCARDIOGRAM (TEE);  Surgeon: Linden DolinAtkins, Broadus Z, MD;  [facility];  Service: Open Heart Surgery;  Laterality: N/A;   TOTAL HIP ARTHROPLASTY Left 05/13/2012   Procedure: TOTAL HIP ARTHROPLASTY ANTERIOR APPROACH;  Surgeon: Shelda PalMatthew D Olin, MD;  Location: [facility];  Service: Orthopedics;  Laterality: Left;   TUBAL LIGATION       Social History:  The patient  reports that she has never smoked. She has never used smokeless tobacco. She reports that she does not drink alcohol and does not use drugs.   Family History:  The patient's family history includes Anesthesia problems in her brother; Cancer in her brother and sister; Heart disease in her father; Kidney disease in her mother.    ROS:  Please see the history of present illness. All other systems are reviewed and  Negative to the above problem except as noted.    PHYSICAL EXAM: VS:  BP 128/78   Pulse 87   Ht 5' (1.524 m)   Wt 160 lb (72.6 kg)   SpO2 97%   BMI 31.25 kg/m    GEN: Obese  66 yo in no acute distress  HEENT: normal  Neck: no JVD   No  carotid bruits Cardiac: RRR; no murmurs  No LE  edema Chest  Wound healing well Respiratory:  Pops and crackles diffse  GI: soft, nontender, nondistended, + BS  No hepatomegaly  MS: no deformity Moving all extremities   Skin: warm and dry, no rash Neuro:  Strength and sensation are intact Psych: euthymic mood, full affect   EKG:  EKG is not done today    Echo  04/25/20 1. Left ventricular ejection fraction, by estimation, is 60 to 65%. The left ventricle has normal function. The left ventricle has no regional wall motion abnormalities. Left  ventricular diastolic parameters are consistent with Grade I diastolic dysfunction (impaired relaxation). 2. Right ventricular systolic function is normal. The right ventricular size is normal. There is normal pulmonary artery systolic pressure. The estimated right ventricular systolic pressure is 28.0 mmHg. 3. The mitral valve is normal in structure.  Trivial mitral valve regurgitation. No evidence of mitral stenosis. 4. The aortic valve is tricuspid. Aortic valve regurgitation is not visualized. Mild aortic valve sclerosis is present, with no evidence of aortic valve stenosis. 5. Aortic dilatation noted. There is mild dilatation of the aortic root, measuring 38 mm. 6. The inferior vena cava is normal in size with greater than 50% respiratory variability, suggesting right atrial pressure of 3 mmHg.   Cardiac cath   04/25/20  Mid RCA lesion is 20% stenosed. Mid LM to Dist LM lesion is 50% stenosed. Ost LAD to Prox LAD lesion is 80% stenosed. Ost Cx to Prox Cx lesion is 50% stenosed. The left ventricular systolic function is normal. LV end diastolic pressure is normal. The left ventricular ejection fraction is 55-65% by visual estimate. There is no mitral valve regurgitation.   1. Moderate distal left main stenosis 2. Severe disease in the ostium of the large caliber LAD extending into the proximal vessel.  3. Moderate stenosis in the ostium of the large caliber Circumflex artery. The small to moderate caliber intermediate vessel has moderate proximal disease.  4. Mild non-obstructive disease in the mid RCA 5. Normal LV systolic function  R heart cath:   RA 0   RV 28/2  Mean PAP 13   PCWP 2       Echo 07/15/20  1. Left ventricular ejection fraction, by estimation, is 45 to 50%. The left ventricle has mildly decreased function. The left ventricle has no regional wall motion abnormalities. There is mild concentric left ventricular hypertrophy. Left ventricular diastolic parameters  are consistent with Grade I diastolic dysfunction (impaired relaxation). 2. Right ventricular systolic function is normal. The right ventricular size is normal. 3. The mitral valve is normal in structure. Trivial mitral valve regurgitation. 4. The aortic valve is tricuspid. Aortic valve regurgitation is not visualized. No aortic stenosis is present. 5. The inferior vena cava is normal in size with greater than 50% respiratory variability, suggesting right atrial pressure of 3 mmHg. Comparison(s): A prior study was performed on 04/28/2020. Prior images reviewed side by side. Improvement in LVEF. Lipid Panel    Component Value Date/Time   CHOL 134 06/13/2021 1400   TRIG 160 (H) 06/13/2021 1400   HDL 48 06/13/2021 1400   CHOLHDL 2.8 06/13/2021 1400   CHOLHDL 4.0 04/27/2020 0306   VLDL 27 04/27/2020 0306   LDLCALC 59 06/13/2021 1400      Wt Readings from Last 3 Encounters:  02/07/22 160 lb (72.6 kg)  06/13/21 159 lb (72.1 kg)  12/15/20 159 lb 12.8 oz (72.5 kg)      ASSESSMENT AND PLAN:   1  Dyspnea  Lung exam is very abnomral  Diffuse pops and rales   Will set up for CT of chest to evaluate lungs     2  CAD  S/P CABG   I am not convinced symptoms are angina   Follow   3  CHF   HFmrEF    VOlume appear ok  Will check BMET and BNP   4  HTN   BP is controlled  Follow    5 Afib   Pt had post op    No recurrence   6  HL    LDL 59        7  Thyroid Check TSH      8  DJD   PT with significant pain from different parts of body   Weaning off of oxycodone   OK to  take 2 enteric coated motrin at night to sleep       F/u next fall/winter   Current medicines are reviewed at length with the patient today.  The patient does not have concerns regarding medicines.  Signed, Dietrich Pates, MD  02/07/2022 4:19 PM    [facility] [address] [place], [place], [place]  [address] Phone: [phone]; Fax: [phone]

## 2022-02-08 LAB — BASIC METABOLIC PANEL
BUN/Creatinine Ratio: 13 (ref 12–28)
BUN: 13 mg/dL (ref 8–27)
CO2: 19 mmol/L — ABNORMAL LOW (ref 20–29)
Calcium: 11.1 mg/dL — ABNORMAL HIGH (ref 8.7–10.3)
Chloride: 103 mmol/L (ref 96–106)
Creatinine, Ser: 0.98 mg/dL (ref 0.57–1.00)
Glucose: 102 mg/dL — ABNORMAL HIGH (ref 70–99)
Potassium: 4.6 mmol/L (ref 3.5–5.2)
Sodium: 136 mmol/L (ref 134–144)
eGFR: 63 mL/min/{1.73_m2} (ref >59)

## 2022-02-08 LAB — PRO B NATRIURETIC PEPTIDE: NT-Pro BNP: 198 pg/mL (ref 0–301)

## 2022-02-19 ENCOUNTER — Telehealth: Payer: Self-pay | Admitting: Internal Medicine

## 2022-02-19 ENCOUNTER — Other Ambulatory Visit: Payer: Self-pay

## 2022-02-19 DIAGNOSIS — R0602 Shortness of breath: Secondary | ICD-10-CM

## 2022-02-19 DIAGNOSIS — I2510 Atherosclerotic heart disease of native coronary artery without angina pectoris: Secondary | ICD-10-CM

## 2022-02-19 DIAGNOSIS — Z79899 Other long term (current) drug therapy: Secondary | ICD-10-CM

## 2022-02-19 NOTE — Telephone Encounter (Signed)
-----   Message from Pricilla Riffle, MD sent at 02/16/2022  8:44 AM EST ----- Electrolytes show Ca elevated a 11.1. Would recomm additional labs to evaluate Check serum phosphorus, TSH, CBC, Vit D , CMET, Vit A, Set for CT chest in Dec  See if can do earlier

## 2022-02-19 NOTE — Telephone Encounter (Signed)
Pt is returning call. Transferred to Frutoso Schatz, RN.

## 2022-02-19 NOTE — Telephone Encounter (Signed)
The patient has been notified of the result and verbalized understanding.  All questions (if any) were answered. Frutoso Schatz, RN 02/19/2022 5:00 PM  Labs have been ordered. Patient will go to [facility] on Thursday to have these drawn.

## 2022-02-25 LAB — COMPREHENSIVE METABOLIC PANEL
ALT: 12 IU/L (ref 0–32)
AST: 23 IU/L (ref 0–40)
Albumin/Globulin Ratio: 1.2 (ref 1.2–2.2)
Albumin: 4.6 g/dL (ref 3.9–4.9)
Alkaline Phosphatase: 100 IU/L (ref 44–121)
BUN/Creatinine Ratio: 24 (ref 12–28)
BUN: 23 mg/dL (ref 8–27)
Bilirubin Total: 0.5 mg/dL (ref 0.0–1.2)
CO2: 22 mmol/L (ref 20–29)
Calcium: 11.2 mg/dL — ABNORMAL HIGH (ref 8.7–10.3)
Chloride: 103 mmol/L (ref 96–106)
Creatinine, Ser: 0.97 mg/dL (ref 0.57–1.00)
Globulin, Total: 3.7 g/dL (ref 1.5–4.5)
Glucose: 104 mg/dL — ABNORMAL HIGH (ref 70–99)
Potassium: 4.6 mmol/L (ref 3.5–5.2)
Sodium: 140 mmol/L (ref 134–144)
Total Protein: 8.3 g/dL (ref 6.0–8.5)
eGFR: 64 mL/min/{1.73_m2} (ref >59)

## 2022-02-25 LAB — VITAMIN D 25 HYDROXY (VIT D DEFICIENCY, FRACTURES): Vit D, 25-Hydroxy: 34.7 ng/mL (ref 30.0–100.0)

## 2022-02-25 LAB — CBC
Hematocrit: 41.6 % (ref 34.0–46.6)
Hemoglobin: 13.6 g/dL (ref 11.1–15.9)
MCH: 29.9 pg (ref 26.6–33.0)
MCHC: 32.7 g/dL (ref 31.5–35.7)
MCV: 91 fL (ref 79–97)
Platelets: 332 10*3/uL (ref 150–450)
RBC: 4.55 x10E6/uL (ref 3.77–5.28)
RDW: 12.6 % (ref 11.7–15.4)
WBC: 9.5 10*3/uL (ref 3.4–10.8)

## 2022-02-25 LAB — TSH: TSH: 2.9 u[IU]/mL (ref 0.450–4.500)

## 2022-02-25 LAB — VITAMIN A: Vitamin A: 63.5 ug/dL (ref 22.0–69.5)

## 2022-02-25 LAB — PHOSPHORUS: Phosphorus: 3.8 mg/dL (ref 3.0–4.3)

## 2022-03-05 ENCOUNTER — Encounter: Payer: Self-pay | Admitting: Internal Medicine

## 2022-03-05 DIAGNOSIS — R0602 Shortness of breath: Secondary | ICD-10-CM

## 2022-03-13 ENCOUNTER — Ambulatory Visit
Admission: RE | Admit: 2022-03-13 | Discharge: 2022-03-13 | Disposition: A | Discharge status: Home / Self Care | Payer: Medicare Other | Source: Ambulatory Visit | Attending: Internal Medicine | Admitting: Internal Medicine

## 2022-03-20 ENCOUNTER — Other Ambulatory Visit: Payer: Self-pay | Admitting: Internal Medicine

## 2022-03-28 ENCOUNTER — Telehealth: Payer: Self-pay

## 2022-03-28 DIAGNOSIS — R0602 Shortness of breath: Secondary | ICD-10-CM

## 2022-03-28 NOTE — Telephone Encounter (Signed)
ASAP referral placed for local Pulmonary consult.

## 2022-03-28 NOTE — Telephone Encounter (Signed)
-----   Message from Richmond Campbell, LPN sent at [id number]/41/[id number] 12:45 PM EST -----  ----- Message ----- From: Fay Records, MD Sent: 03/16/2022   5:09 PM EST To: Brand Males, MD; Cv Div [facility] Triage  I reviewed findings with pt   appears to be diffuse inflammation of lungs  She notes some sharp pleuritic pains     Has been having nonproductive cough. Feels she cannot go back to [facility]    Too much walking  Will forward to pulmonary to see if she can get in    Does not want to be on steroids if possible   Had bad reaction in past

## 2022-04-10 NOTE — Telephone Encounter (Signed)
I am not sure where Dx of asthma was made   I did not put that in my note     She was not wheezing but lung exam was not normal   With current CT findings and my exam I think it is important for her to see pulmomary    Goal to get her feeling better    I tried to call her but there was no answer.

## 2022-04-10 NOTE — Addendum Note (Signed)
Addended by: Stephani Police on: 04/10/2022 05:37 PM   Modules accepted: Orders

## 2022-05-04 ENCOUNTER — Ambulatory Visit (INDEPENDENT_AMBULATORY_CARE_PROVIDER_SITE_OTHER): Payer: Medicare Other | Admitting: Pulmonary Disease

## 2022-05-04 ENCOUNTER — Encounter: Payer: Self-pay | Admitting: Pulmonary Disease

## 2022-05-04 VITALS — BP 110/62 | HR 85 | Temp 98.2°F | Ht 60.0 in | Wt 157.0 lb

## 2022-05-04 DIAGNOSIS — J849 Interstitial pulmonary disease, unspecified: Secondary | ICD-10-CM | POA: Diagnosis not present

## 2022-05-04 NOTE — Patient Instructions (Addendum)
Will get some additional labs to reevaluate your interstitial lung disease Please get rid of the down pillows Limit exposure to the bird Will schedule PFTs Follow-up in 2 to 3 months.
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
## 2022-05-31 ENCOUNTER — Other Ambulatory Visit (INDEPENDENT_AMBULATORY_CARE_PROVIDER_SITE_OTHER): Payer: Medicare Other

## 2022-05-31 DIAGNOSIS — J849 Interstitial pulmonary disease, unspecified: Secondary | ICD-10-CM

## 2022-05-31 LAB — CK: Total CK: 21 U/L (ref 7–177)

## 2022-05-31 LAB — C-REACTIVE PROTEIN: CRP: <1 mg/dL (ref 0.5–20.0)

## 2022-05-31 LAB — SEDIMENTATION RATE: Sed Rate: 41 mm/hr — ABNORMAL HIGH (ref 0–30)

## 2022-06-05 LAB — HYPERSENSITIVITY PNEUMONITIS
A. Pullulans Abs: NEGATIVE
A.Fumigatus #1 Abs: NEGATIVE
Micropolyspora faeni, IgG: NEGATIVE
Pigeon Serum Abs: POSITIVE — AB
Thermoact. Saccharii: NEGATIVE
Thermoactinomyces vulgaris, IgG: NEGATIVE

## 2022-06-05 LAB — ANTI-NUCLEAR AB-TITER (ANA TITER)
ANA TITER: 1:40 {titer} — ABNORMAL HIGH
ANA Titer 1: 1:40 {titer} — ABNORMAL HIGH

## 2022-06-05 LAB — SJOGREN'S SYNDROME ANTIBODS(SSA + SSB)
SSA (Ro) (ENA) Antibody, IgG: <1 AI
SSB (La) (ENA) Antibody, IgG: <1 AI

## 2022-06-05 LAB — CYCLIC CITRUL PEPTIDE ANTIBODY, IGG: Cyclic Citrullin Peptide Ab: 16 UNITS

## 2022-06-05 LAB — ANTI-DNA ANTIBODY, DOUBLE-STRANDED: ds DNA Ab: <1 IU/mL

## 2022-06-05 LAB — RNP ANTIBODIES: ENA RNP Ab: <0.2 AI (ref 0.0–0.9)

## 2022-06-05 LAB — ANA: Anti Nuclear Antibody (ANA): POSITIVE — AB

## 2022-06-05 LAB — ALDOLASE: Aldolase: 3.8 U/L (ref <=8.1)

## 2022-06-05 LAB — ANTI-SCLERODERMA ANTIBODY: Scleroderma (Scl-70) (ENA) Antibody, IgG: <1 AI

## 2022-06-05 LAB — RHEUMATOID FACTOR: Rheumatoid fact SerPl-aCnc: <14 IU/mL (ref <14)

## 2022-07-02 ENCOUNTER — Ambulatory Visit (INDEPENDENT_AMBULATORY_CARE_PROVIDER_SITE_OTHER): Payer: Medicare Other | Admitting: Pulmonary Disease

## 2022-07-02 ENCOUNTER — Encounter: Payer: Self-pay | Admitting: Pulmonary Disease

## 2022-07-02 VITALS — BP 130/66 | HR 106 | Ht 60.0 in | Wt 158.4 lb

## 2022-07-02 DIAGNOSIS — J849 Interstitial pulmonary disease, unspecified: Secondary | ICD-10-CM

## 2022-07-02 LAB — PULMONARY FUNCTION TEST
DL/VA % pred: 77 %
DL/VA: 3.31 ml/min/mmHg/L
DLCO cor % pred: 50 %
DLCO cor: 8.71 ml/min/mmHg
DLCO unc % pred: 50 %
DLCO unc: 8.71 ml/min/mmHg
FEF 25-75 Post: 2.58 L/sec
FEF 25-75 Pre: 1.42 L/sec
FEF2575-%Change-Post: 81 %
FEF2575-%Pred-Post: 142 %
FEF2575-%Pred-Pre: 78 %
FEV1-%Change-Post: 16 %
FEV1-%Pred-Post: 63 %
FEV1-%Pred-Pre: 54 %
FEV1-Post: 1.26 L
FEV1-Pre: 1.08 L
FEV1FVC-%Change-Post: -5 %
FEV1FVC-%Pred-Pre: 109 %
FEV6-%Change-Post: 24 %
FEV6-%Pred-Post: 62 %
FEV6-%Pred-Pre: 50 %
FEV6-Post: 1.58 L
FEV6-Pre: 1.27 L
FEV6FVC-%Change-Post: 0 %
FEV6FVC-%Pred-Post: 103 %
FEV6FVC-%Pred-Pre: 102 %
FVC-%Change-Post: 22 %
FVC-%Pred-Post: 60 %
FVC-%Pred-Pre: 49 %
FVC-Post: 1.59 L
FVC-Pre: 1.29 L
Post FEV1/FVC ratio: 79 %
Post FEV6/FVC ratio: 99 %
Pre FEV1/FVC ratio: 84 %
Pre FEV6/FVC Ratio: 98 %
RV % pred: 70 %
RV: 1.37 L
TLC % pred: 101 %
TLC: 4.54 L

## 2022-07-02 NOTE — Patient Instructions (Signed)
Will work on arranging a bronchoscopy for further evaluation of lung issues Based on the results we may put you on either prednisone or CellCept Return to clinic in 1 to 2 months.

## 2022-07-02 NOTE — Progress Notes (Signed)
Full PFT performed today. °

## 2022-07-02 NOTE — Patient Instructions (Signed)
Full PFT performed today. °
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
## 2022-07-05 ENCOUNTER — Encounter (HOSPITAL_COMMUNITY): Payer: Self-pay | Admitting: Vascular Surgery

## 2022-07-05 NOTE — Anesthesia Preprocedure Evaluation (Deleted)
Anesthesia Evaluation    Airway        Dental   Pulmonary           Cardiovascular hypertension,      Neuro/Psych    GI/Hepatic   Endo/Other    Renal/GU      Musculoskeletal   Abdominal   Peds  Hematology   Anesthesia Other Findings   Reproductive/Obstetrics                             Anesthesia Physical Anesthesia Plan  ASA:   Anesthesia Plan:    Post-op Pain Management:    Induction:   PONV Risk Score and Plan:   Airway Management Planned:   Additional Equipment:   Intra-op Plan:   Post-operative Plan:   Informed Consent:   Plan Discussed with:   Anesthesia Plan Comments: (PAT note written 07/05/2022 by Shonna Chock, PA-C.  )       Anesthesia Quick Evaluation

## 2022-07-05 NOTE — Progress Notes (Addendum)
Anesthesia Chart Review:  Case: [id number] Date/Time: 07/06/22 1412   Procedure: VIDEO BRONCHOSCOPY WITH FLUORO   Anesthesia type: Choice   Pre-op diagnosis: Hemoptysis   [facility]OM 12 / [facility]   Surgeons: Lorin Glass, MD       DISCUSSION: Patient is a 67 year old female scheduled for the above procedure. Recently referred to pulmonology by her cardiologist Dr. Tenny Craw for evaluation of CT findings suggestive of chronic hypersensitivity pneumonitis. She had seen pulmonology previously (~ 2021-2022) but then required CABG. Last visit with Dr. Isaiah Serge, note not yet finalized, but she is now scheduled for the above procedure.   History includes never smoker, post-operative N/V, HTN, HLD, CAD (CABG: LIMA-LAD, pedicles RIMA-OM 04/27/20; post-op PAF), polymyalgia rheumatica, GERD, anemia, snoring, spinal surgery, osteoarthritis (right THR 11/14/10; left THA 05/13/12).    Anesthesia team to evaluate on the day of surgery.   VS:  BP Readings from Last 3 Encounters:  07/02/22 130/66  05/04/22 110/62  02/07/22 128/78   Pulse Readings from Last 3 Encounters:  07/02/22 (!) 106  05/04/22 85  02/07/22 87     PROVIDERS: Deloris Ping, MD is PCP  Dietrich Pates, MD is cardiologist. Last visit 02/07/22.  Chilton Greathouse, MD is pulmonologist. She also saw Darrick Huntsman, MD with [facility] in the past.    LABS: For updated labs on arrival as indicated. As of 02/22/22 Cr 0.97, Calcium elevated 11.2, glucose 104, AST 23, ALT 12, CBC normal.   PFTs 07/02/22: FVC 1.29 (49%), post 1.59 (60%). FEV1 1.08 (54%), post 1.26 (63%). DLCO unc/cor 8.71 (50%).   IMAGES: CT Chest High Resolution 03/13/22: IMPRESSION: 1. Extensive patchy air trapping throughout both lungs, indicative of small airways disease. Mild to moderate patchy ground-glass opacity and scattered mild reticulation throughout both lungs with an upper lobe predominance. Findings are chronic and slightly  more pronounced since 05/02/2020 chest CT. Findings are most suggestive of chronic hypersensitivity pneumonitis. Findings are suggestive of an alternative diagnosis (not UIP) per consensus guidelines: Diagnosis of Idiopathic Pulmonary Fibrosis: An Official ATS/ERS/JRS/ALAT Clinical Practice Guideline. Am Rosezetta Schlatter Crit Care Med Vol 198, Iss 5, [id number], Nov 24 2016. 2.  Aortic Atherosclerosis (ICD10-I70.0).    EKG: 06/13/21: NSR. Non-specific ST changes.    CV: Echo 07/15/20: IMPRESSIONS   1. Left ventricular ejection fraction, by estimation, is 45 to 50%. The  left ventricle has mildly decreased function. The left ventricle has no  regional wall motion abnormalities. There is mild concentric left  ventricular hypertrophy. Left ventricular  diastolic parameters are consistent with Grade I diastolic dysfunction  (impaired relaxation).   2. Right ventricular systolic function is normal. The right ventricular  size is normal.   3. The mitral valve is normal in structure. Trivial mitral valve  regurgitation.   4. The aortic valve is tricuspid. Aortic valve regurgitation is not  visualized. No aortic stenosis is present.   5. The inferior vena cava is normal in size with greater than 50%  respiratory variability, suggesting right atrial pressure of 3 mmHg.  - Comparison(s): A prior study was performed on 04/28/2020. Prior images  reviewed side by side. Improvement in LVEF.  - Comparison: 04/28/20 LVEF 35-40%, LV global hypokinesis, mildly reduced RVSF, normal PASP, mild MR; 04/25/20 LVEF 60-65%, grade 1 DD, RVSP 28.0 mmHg, trivial MR, aortic root 38 mm.  Long term monitor 06/08/20: Patch Wear Time:  6 days and 21 hours Predominent rhythm is sinus with some bursts of atrial fibrillation  longest spell lasting 10.5 minutes at 124 bpm  Afib/flutter burden <1% time. Rates 56 to 207 bpm   Average HR 75 bpm    Rare PVC   US Carotid 04/25/20: Summary:  Right Carotid: Velocities in the right ICA  are consistent with a 1-39%  stenosis.  Left Carotid: Velocities in the left ICA are consistent with a 1-39%  stenosis.  Vertebrals: Bilateral vertebral arteries demonstrate antegrade flow.    [facility] 04/25/20 pre-CABG.  Past Medical History:  Diagnosis Date   Anemia    HX IRON INFUSION   Arthritis    Blood transfusion 10/2010   Chronic back pain    Coronary artery disease    GERD (gastroesophageal reflux disease)    Headache(784.0)    migraines   Hyperlipidemia    Hypertension    under control, has been on med. since 2001   Osteopenia    Polymyalgia rheumatica    PONV (postoperative nausea and vomiting)    Snores    denies apnea   Spinal stenosis     Past Surgical History:  Procedure Laterality Date   APPENDECTOMY     BACK SURGERY  1990, 1992   L4-5   CHOLECYSTECTOMY  08/17/2011   Procedure: LAPAROSCOPIC CHOLECYSTECTOMY WITH INTRAOPERATIVE CHOLANGIOGRAM;  Surgeon: Ernestene Mention, MD;  Location: [facility];  Service: General;  Laterality: N/A;  Laparoscopic cholecystectomy with cholangiogram   CORONARY ARTERY BYPASS GRAFT N/A 04/27/2020   Procedure: CORONARY ARTERY BYPASS GRAFTING (CABG) TIMES TWO USING BILATERAL INTERNAL MAMMARY ARTERIES;  Surgeon: Linden Dolin, MD;  Location[facility];  Service: Open Heart Surgery;  Laterality: N/A;  BIMA   JOINT REPLACEMENT  11/14/2010   right hip   RIGHT/LEFT HEART CATH AND CORONARY ANGIOGRAPHY N/A 04/25/2020   Procedure: RIGHT/LEFT HEART CATH AND CORONARY ANGIOGRAPHY;  Surgeon: Kathleene Hazel, MD;  [facility];  Service: Cardiovascular;  Laterality: N/A;   TEE WITHOUT CARDIOVERSION N/A 04/27/2020   Procedure: TRANSESOPHAGEAL ECHOCARDIOGRAM (TEE);  Surgeon: Linden Dolin, MD;  Location[facility] OR;  Service: Open Heart Surgery;  Laterality: N/A;   TOTAL HIP ARTHROPLASTY Left 05/13/2012   Procedure: TOTAL HIP ARTHROPLASTY ANTERIOR APPROACH;  Surgeon: Shelda Pal, MD;  Location: [facility] ORS;  Service:  Orthopedics;  Laterality: Left;   TUBAL LIGATION      MEDICATIONS: No current facility-administered medications for this encounter.    amoxicillin (AMOXIL) 500 MG capsule   aspirin EC 81 MG tablet   Cholecalciferol 25 MCG (1000 UT) tablet   cyclobenzaprine (FLEXERIL) 10 MG tablet   diclofenac Sodium (VOLTAREN) 1 % GEL   diphenhydrAMINE (BENADRYL) 25 mg capsule   ENTRESTO 24-26 MG   furosemide (LASIX) 40 MG tablet   gabapentin (NEURONTIN) 100 MG capsule   metoprolol succinate (TOPROL-XL) 25 MG 24 hr tablet   Oxycodone HCl 10 MG TABS   promethazine (PHENERGAN) 25 MG tablet   rosuvastatin (CRESTOR) 20 MG tablet  She is not currently taking amoxicillin.   Shonna Chock, PA-C Surgical Short Stay/Anesthesiology [facility] Phone [phone] [facility] Phone [phone] 07/05/2022 4:45 PM

## 2022-07-06 ENCOUNTER — Encounter (HOSPITAL_COMMUNITY): Admission: RE | Payer: Self-pay | Source: Home / Self Care

## 2022-07-06 ENCOUNTER — Inpatient Hospital Stay (HOSPITAL_COMMUNITY): Admission: RE | Admit: 2022-07-06 | Payer: Medicare Other | Source: Home / Self Care | Admitting: Internal Medicine

## 2022-07-06 ENCOUNTER — Other Ambulatory Visit: Payer: Self-pay

## 2022-07-06 ENCOUNTER — Telehealth: Payer: Self-pay | Admitting: Pulmonary Disease

## 2022-07-06 DIAGNOSIS — J849 Interstitial pulmonary disease, unspecified: Secondary | ICD-10-CM

## 2022-07-06 SURGERY — BRONCHOSCOPY, WITH FLUOROSCOPY
Anesthesia: Choice

## 2022-07-06 NOTE — Telephone Encounter (Signed)
Allergies  Allergen Reactions   Imitrex [Sumatriptan Base] Other (See Comments)    CHEST PAIN     Assessment:  Evaluation for interstitial lung disease, possible hypersensitivity pneumonitis CT scan shows upper lobe predominant reticulation with prominent at trapping.  This is an alternate pattern and consistent with hypersensitivity pneumonitis.  Hypersensitivity panel is positive for emergency room.  She does have arthritis, ANA is borderline elevated at 1: 40 which is likely nonsignificant   There is ongoing exposure to parrot though she got rid of the feather pillow.  She does not want to get rid of the parrot for sentimental reason though she has moved it to a different room.    She also does not want to go on prednisone as she had been on prednisone in the past for polymyalgia rheumatica and it caused significant weight gain and may need CellCept if diagnosis is confirmed. Given her worsening symptoms and possible need to go on CellCept she will need a bronchoscope with BAL and possible transbronchial biopsies to confirm diagnosis and rule out any underlying infection. She has previously reluctant to undergo it but now is agreeable.  Will schedule her in the next few weeks.   Plan/Recommendations: Allergen avoidance.  Keep distance from the parrot and get rid of down pillows Schedule bronchoscope   Chilton Greathouse MD [facility] 07/02/2022, 3:04 PM   CC: Ryter-Brown, [name] [name], *          Patient Instructions by Chilton Greathouse, MD at 07/02/2022 3:00 PM  Author: Chilton Greathouse, MD Author Type: Physician Filed: 07/02/2022  3:28 PM  Note Status: Signed Cosign: Cosign Not Required Encounter Date: 07/02/2022  Editor: Chilton Greathouse, MD (Physician)             Will work on arranging a bronchoscopy for further evaluation of lung issues Based on the results we may put you on either prednisone or CellCept Return to clinic in 1 to 2 months.       Orthostatic  Vitals Recorded in This Encounter   07/02/2022 1456     Patient Position: Sitting  BP Location: Left Arm  Cuff Size: Normal   Instructions   Return in about 2 months (around 09/01/2022). Will work on arranging a bronchoscopy for further evaluation of lung issues Based on the results we may put you on either prednisone or CellCept Return to clinic in 1 to 2 months     Called and spoke with patient, she states she has not been feeling well, she has had head congestion, pain in cheeks and teeth as well as neck.  She says the mucous is clear that she blows from her nose.  She has a dry cough.  She has also been sob.  She says her sinuses have been draining and believes the pollen has been bothering her.  She says it was recommended that she get Zyrtec, however, she has not been able to get it yet, but she will get some.  She denies any chills, states she has not taken her temperature.    Dr. Isaiah Serge, please advise on recommendations.  Thank you.  She also has concerns about having the bronchoscopy on 4/19, she has a daughter with developmental issues and she is very anxious about her having the procedure.  She also has no one to stay with her if something were to happen and she were to need to stay in the hospital overnight.  She would like to postpone the procedure until  July when her daughter that lives in [place] can come and be with her.  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  She already has GI issues.  Please advise regarding procedure.  Thank you.

## 2022-07-09 NOTE — Telephone Encounter (Signed)
Called and spoke with pt letting her know the info per Dr. Isaiah Serge and she verbalized understanding. Routing to procedure pool so they can cancel pt's bronch.

## 2022-07-09 NOTE — Telephone Encounter (Signed)
Okay.  As per patient preference we will hold off on bronchoscopy or CellCept at this point I would strongly advise her to avoid exposure to bird feather to prevent any worsening.

## 2022-07-10 NOTE — Telephone Encounter (Signed)
Procedure has been cancelled.

## 2022-07-13 ENCOUNTER — Encounter (HOSPITAL_COMMUNITY): Admission: RE | Payer: Self-pay | Source: Home / Self Care

## 2022-07-13 ENCOUNTER — Ambulatory Visit (HOSPITAL_COMMUNITY): Admission: RE | Admit: 2022-07-13 | Payer: Medicare Other | Source: Home / Self Care | Admitting: Pulmonary Disease

## 2022-07-13 SURGERY — BRONCHOSCOPY, WITH FLUOROSCOPY
Anesthesia: Monitor Anesthesia Care

## 2022-08-13 ENCOUNTER — Other Ambulatory Visit: Payer: Self-pay | Admitting: *Deleted

## 2022-08-13 MED ORDER — ROSUVASTATIN CALCIUM 20 MG PO TABS: ORAL_TABLET | ORAL | 4 refills | Status: DC

## 2022-08-30 ENCOUNTER — Ambulatory Visit: Payer: Medicare Other | Admitting: Pulmonary Disease

## 2022-09-07 ENCOUNTER — Other Ambulatory Visit (HOSPITAL_COMMUNITY): Payer: Self-pay

## 2022-10-09 ENCOUNTER — Encounter: Payer: Self-pay | Admitting: Pulmonary Disease

## 2022-10-09 ENCOUNTER — Ambulatory Visit (INDEPENDENT_AMBULATORY_CARE_PROVIDER_SITE_OTHER): Payer: Medicare Other | Admitting: Pulmonary Disease

## 2022-10-09 VITALS — BP 136/82 | HR 81 | Temp 98.0°F | Ht 60.0 in | Wt 156.2 lb

## 2022-10-09 DIAGNOSIS — J849 Interstitial pulmonary disease, unspecified: Secondary | ICD-10-CM

## 2022-10-09 NOTE — Patient Instructions (Signed)
Please continue to do allergen avoidance We will get high resolution CT and PFTs in 4 months Return to clinic in 4 months after these tests.
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
## 2023-01-19 ENCOUNTER — Other Ambulatory Visit: Payer: Self-pay | Admitting: Internal Medicine

## 2023-01-28 ENCOUNTER — Ambulatory Visit
Admission: RE | Admit: 2023-01-28 | Discharge: 2023-01-28 | Disposition: A | Discharge status: Home / Self Care | Payer: Medicare Other | Source: Ambulatory Visit | Attending: Pulmonary Disease

## 2023-01-28 ENCOUNTER — Ambulatory Visit (INDEPENDENT_AMBULATORY_CARE_PROVIDER_SITE_OTHER): Payer: Medicare Other | Admitting: Pulmonary Disease

## 2023-01-28 DIAGNOSIS — J849 Interstitial pulmonary disease, unspecified: Secondary | ICD-10-CM | POA: Diagnosis not present

## 2023-01-28 NOTE — Patient Instructions (Signed)
Full PFT performed today. °

## 2023-01-28 NOTE — Progress Notes (Signed)
Full PFT performed today. °

## 2023-01-29 LAB — PULMONARY FUNCTION TEST
DL/VA % pred: 81 %
DL/VA: 3.47 ml/min/mmHg/L
DLCO cor % pred: 66 %
DLCO cor: 11.29 ml/min/mmHg
DLCO unc % pred: 66 %
DLCO unc: 11.29 ml/min/mmHg
FEF 25-75 Post: 1.81 L/s
FEF 25-75 Pre: 0.81 L/s
FEF2575-%Change-Post: 124 %
FEF2575-%Pred-Post: 102 %
FEF2575-%Pred-Pre: 45 %
FEV1-%Change-Post: 17 %
FEV1-%Pred-Post: 66 %
FEV1-%Pred-Pre: 57 %
FEV1-Post: 1.31 L
FEV1-Pre: 1.12 L
FEV1FVC-%Change-Post: 4 %
FEV1FVC-%Pred-Pre: 99 %
FEV6-%Change-Post: 12 %
FEV6-%Pred-Post: 67 %
FEV6-%Pred-Pre: 59 %
FEV6-Post: 1.66 L
FEV6-Pre: 1.48 L
FEV6FVC-%Pred-Post: 104 %
FEV6FVC-%Pred-Pre: 104 %
FVC-%Change-Post: 12 %
FVC-%Pred-Post: 64 %
FVC-%Pred-Pre: 57 %
FVC-Post: 1.66 L
FVC-Pre: 1.48 L
Post FEV1/FVC ratio: 79 %
Post FEV6/FVC ratio: 100 %
Pre FEV1/FVC ratio: 76 %
Pre FEV6/FVC Ratio: 100 %
RV % pred: 95 %
RV: 1.86 L
TLC % pred: 80 %
TLC: 3.57 L

## 2023-02-05 ENCOUNTER — Encounter: Payer: Self-pay | Admitting: Pulmonary Disease

## 2023-02-05 ENCOUNTER — Ambulatory Visit (INDEPENDENT_AMBULATORY_CARE_PROVIDER_SITE_OTHER): Payer: Medicare Other | Admitting: Pulmonary Disease

## 2023-02-05 VITALS — BP 102/64 | HR 77 | Ht 60.0 in | Wt 165.8 lb

## 2023-02-05 DIAGNOSIS — J849 Interstitial pulmonary disease, unspecified: Secondary | ICD-10-CM | POA: Diagnosis not present
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
## 2023-02-05 NOTE — Patient Instructions (Signed)
VISIT SUMMARY:  During today's visit, we discussed your ongoing issues with interstitial lung disease, which is believed to be caused by exposure to birds. You also mentioned experiencing back pain, shortness of breath, and feelings of depression due to recent life stressors. We reviewed your progress and made plans for your continued care.  YOUR PLAN:  -INTERSTITIAL LUNG DISEASE (HYPERSENSITIVITY PNEUMONITIS): This condition is caused by an allergic reaction to inhaled environmental substances, such as bird feathers or droppings. You have shown improvement in your symptoms and lung function since avoiding bird exposure. We will continue to monitor your condition and consider the use of an inhaler if necessary, although you have expressed concerns about side effects.  -DEPRESSION: Depression is a mood disorder characterized by persistent feelings of sadness and loss of interest. Your feelings of depression are understandable given your recent life stressors. We encourage you to seek mental health support if you feel it would be helpful.  INSTRUCTIONS:  Please continue to avoid bird exposure to help manage your lung condition. If you experience any worsening of symptoms or new concerns, contact our office. We will follow up in 6 months to monitor your lung function and overall health.

## 2023-02-13 ENCOUNTER — Other Ambulatory Visit: Payer: Self-pay | Admitting: Internal Medicine

## 2023-02-19 ENCOUNTER — Other Ambulatory Visit: Payer: Self-pay

## 2023-02-19 MED ORDER — METOPROLOL SUCCINATE ER 25 MG PO TB24: ORAL_TABLET | ORAL | 3 refills | Status: AC

## 2023-03-21 ENCOUNTER — Encounter: Payer: Self-pay | Admitting: Internal Medicine

## 2023-03-22 ENCOUNTER — Telehealth: Payer: Self-pay

## 2023-03-22 ENCOUNTER — Other Ambulatory Visit: Payer: Self-pay | Admitting: Internal Medicine

## 2023-03-22 ENCOUNTER — Other Ambulatory Visit (HOSPITAL_COMMUNITY): Payer: Self-pay

## 2023-03-22 NOTE — Telephone Encounter (Signed)
Attempted to call patient as there appears to be conflicting information in her message, no answer, no phone # listed on DPR. Left a message with no identifiers asking recipient to call [place] at our office # or check her MyChart messages.  Patient appears to have concerns about her entresto prescription. I called pharmacy on record as script for [name] was sent out today. Pharmacist states that 30 day supply has been filled and cost to patient is $10. In her MyChart messages, patient seems to be wanting 90 day supply for $10 but I do not know if this is something we can currently provide in the time frame she has requested. Forwarded to [employer] to see if they can help fill in the blanks about what assistance/coverage we have been helping her with.

## 2023-03-22 NOTE — Telephone Encounter (Signed)
Test claim isn't telling me anything since patient has a paid claim currently somewhere else. There is a copay card for commercially insured folks that bring it to $10. She would just need a script for a 90 day supply in order to use that for the 3 month fill. If pharmacy is saying the cost is $10, then it seems as though they are running the [place] copay card correctly. She isn't medicare for RX coverage (only medical) so she wouldn't qualify for a grant or a PAP application. I'm pulling a Geophysicist/field seismologist for her still in [place]. If she enrolls in [place] part D next year we can do a Engineer, manufacturing.

## 2023-03-26 MED ORDER — ENTRESTO 24-26 MG PO TABS: 1.0000 | ORAL_TABLET | Freq: Two times a day (BID) | ORAL | 0 refills | Status: DC

## 2023-03-26 NOTE — Telephone Encounter (Signed)
error 

## 2023-03-26 NOTE — Addendum Note (Signed)
 Addended by: Lars Mage on: 03/26/2023 01:13 PM   Modules accepted: Orders

## 2023-03-26 NOTE — Telephone Encounter (Signed)
 Returned call to patient who did think she was supposed to get 90 days' supply for $10, but states that it could have been a mistake. Informed her that I will send in refills to carry her through to seeing Dr Okey in February. She states that the postal service is switching to medicare effective tomorrow, 03/27/23. Informed her that if that happens, per kayla, she should be eligible for a grant.

## 2023-03-28 ENCOUNTER — Telehealth: Payer: Self-pay

## 2023-03-28 ENCOUNTER — Other Ambulatory Visit (HOSPITAL_COMMUNITY): Payer: Self-pay

## 2023-03-28 NOTE — Telephone Encounter (Signed)
 It looks as though her medicare benefits are active, working to enroll patient in a grant

## 2023-03-28 NOTE — Telephone Encounter (Addendum)
 Patient Advocate Encounter   The patient was approved for a [employer] grant that will help cover the cost of ENTRESTO  Total amount awarded, $10,000.  Effective: 02/26/23 - 02/25/24   APW:[id number] ERW:[id number] Hmnle:[id number] PI:[id number]   Pharmacy provided with approval and processing information. Patient informed via [name], CPhT  Pharmacy Patient Advocate Specialist  Direct Number: [phone] Fax: [phone]

## 2023-04-28 NOTE — Progress Notes (Signed)
 Cardiology Office Note   Date:  05/01/2023   ID:  [name], [name] 08/19/1955, MRN [id number]  PCP:  Clarance Joen HERO, MD  Cardiologist:   Vina Gull, MD   Pt returns for f/u of CAD     History of Present Illness: [name] is a 68 y.o. female with a history of PMR,  HTN, and OA    She began complaining of coughing, SOB in 2020    R sided CP   Seen at [facility].   CT showed scarring and inflamm lungs  O2 sats 88%   ? Hypersensitivity pneumonitis Spirometry showed decreased FEV1 and FVC  Nov 2021  CT of chest (at [facility]) showed bilateral diffuse mosaic  pattern,  ? Pneumonitis  Note CT also showed moderate coronary calcificaitons   Bronchoscopy planned.    Jan 2022  The pt was seen in IM    Work up for ILD and pneuonitis.   BP was found to be severely elevated  Pt was placed on lisinopril/ HCTZ   She had been on lisinopril in the past   Complicated by cough    I saw her in Jan 2022   She complained of chest pressure with activity Given CT showing CAD,  pt set up for a R and L heart cath (results below) The pt went on to have CABG x 2 (LIMA to LAD; RIMA to OM)   Post op complicated by Afib with RVR  Rx with amiodarone  and Eliqus    After d/c,  amiodarone  was stopped due to bradycardia and poor appetite.  I last saw the pt in Nov 2023    The pt has been seen by P Mannam in pulmonary in the interval    SInce I saw her the pt denies CP. Breathing is stable    Does get SOB with activity.   She denies palpitations. SHe is very upset today   Increased stress due to family issues and politics     Current Meds  Medication Sig   amoxicillin (AMOXIL) 500 MG capsule Take 500 mg by mouth as directed. Before dental aapointments   aspirin  EC 81 MG tablet Take 1 tablet (81 mg total) by mouth daily. Swallow whole.   Cholecalciferol 25 MCG (1000 UT) tablet Take 1,000 Units by mouth in the morning and at bedtime.   cyclobenzaprine  (FLEXERIL ) 10 MG tablet Take 10 mg by mouth 3 (three) times daily as  needed (Pain).   diclofenac Sodium (VOLTAREN) 1 % GEL Apply 2 g topically daily as needed (Join pain).   diphenhydrAMINE  (BENADRYL ) 25 mg capsule Take 50 mg by mouth at bedtime as needed for sleep (drainage).   furosemide  (LASIX ) 40 MG tablet Take one tablet by mouth as directed   gabapentin  (NEURONTIN ) 100 MG capsule Take 100-200 mg by mouth at bedtime as needed (Restless leg).   metoprolol  succinate (TOPROL -XL) 25 MG 24 hr tablet TAKE 1 TABLET(25 MG) BY MOUTH DAILY   omeprazole (PRILOSEC) 20 MG capsule Take 20 mg by mouth daily.   Oxycodone  HCl 10 MG TABS Take 10 mg by mouth 4 (four) times daily as needed (Pain).   promethazine  (PHENERGAN ) 25 MG tablet Take 25 mg by mouth 4 (four) times daily as needed for nausea or vomiting.   rosuvastatin  (CRESTOR ) 20 MG tablet TAKE 1 TABLET(20 MG) BY MOUTH DAILY   sacubitril-valsartan (ENTRESTO ) 24-26 MG Take 1 tablet by mouth 2 (two) times daily.     Allergies:   Imitrex [  sumatriptan base]   Past Medical History:  Diagnosis Date   Anemia    HX IRON INFUSION   Arthritis    Blood transfusion 10/2010   Chronic back pain    Coronary artery disease    GERD (gastroesophageal reflux disease)    Headache(784.0)    migraines   Hyperlipidemia    Hypertension    under control, has been on med. since 2001   Osteopenia    Polymyalgia rheumatica (HCC)    PONV (postoperative nausea and vomiting)    Snores    denies apnea   Spinal stenosis     Past Surgical History:  Procedure Laterality Date   APPENDECTOMY     BACK SURGERY  1990, 1992   L4-5   CHOLECYSTECTOMY  08/17/2011   Procedure: LAPAROSCOPIC CHOLECYSTECTOMY WITH INTRAOPERATIVE CHOLANGIOGRAM;  Surgeon: Elon CHRISTELLA Pacini, MD;  Location: [facility];  Service: General;  Laterality: N/A;  Laparoscopic cholecystectomy with cholangiogram   CORONARY ARTERY BYPASS GRAFT N/A 04/27/2020   Procedure: CORONARY ARTERY BYPASS GRAFTING (CABG) TIMES TWO USING BILATERAL INTERNAL MAMMARY ARTERIES;   Surgeon: German Bartlett PEDLAR, MD;  Location: [facility] OR;  Service: Open Heart Surgery;  Laterality: N/A;  BIMA   JOINT REPLACEMENT  11/14/2010   right hip   RIGHT/LEFT HEART CATH AND CORONARY ANGIOGRAPHY N/A 04/25/2020   Procedure: RIGHT/LEFT HEART CATH AND CORONARY ANGIOGRAPHY;  Surgeon: Verlin Lonni BIRCH, MD;  Location: [facility];  Service: Cardiovascular;  Laterality: N/A;   TEE WITHOUT CARDIOVERSION N/A 04/27/2020   Procedure: TRANSESOPHAGEAL ECHOCARDIOGRAM (TEE);  Surgeon: German Bartlett PEDLAR, MD;  Location: [facility];  Service: Open Heart Surgery;  Laterality: N/A;   TOTAL HIP ARTHROPLASTY Left 05/13/2012   Procedure: TOTAL HIP ARTHROPLASTY ANTERIOR APPROACH;  Surgeon: Donnice BIRCH Car, MD;  Location: [facility]S;  Service: Orthopedics;  Laterality: Left;   TUBAL LIGATION       Social History:  The patient  reports that she has never smoked. She has been exposed to tobacco smoke. She has never used smokeless tobacco. She reports that she does not drink alcohol and does not use drugs.   Family History:  The patient's family history includes Anesthesia problems in her brother; Cancer in her brother and sister; Heart disease in her father; Kidney disease in her mother.    ROS:  Please see the history of present illness. All other systems are reviewed and  Negative to the above problem except as noted.    PHYSICAL EXAM: VS:  BP (!) 142/76   Pulse (!) 54   Ht 5' (1.524 m)   Wt 171 lb 12.8 oz (77.9 kg)   SpO2 96%   BMI 33.55 kg/m    GEN: Obese  68 yo in no acute distress  HEENT: normal  Neck: no JVD   No  carotid bruits Cardiac: RRR; no murmurs NO LE edema Respiratory: Moving air  No wheezes  GI: soft, nontender, no masses  No hepatomegaly  MS: no deformity Moving all extremities     EKG:  EKG sows SR with PACs  77 bpm    Echo APril 2022  1. Left ventricular ejection fraction, by estimation, is 45 to 50%. The  left ventricle has mildly decreased function. The left ventricle has no   regional wall motion abnormalities. There is mild concentric left  ventricular hypertrophy. Left ventricular  diastolic parameters are consistent with Grade I diastolic dysfunction  (impaired relaxation).   2. Right ventricular systolic function is normal. The  right ventricular  size is normal.   3. The mitral valve is normal in structure. Trivial mitral valve  regurgitation.   4. The aortic valve is tricuspid. Aortic valve regurgitation is not  visualized. No aortic stenosis is present.   5. The inferior vena cava is normal in size with greater than 50%  respiratory variability, suggesting right atrial pressure of 3 mmHg.   Comparison(s): A prior study was performed on 04/28/2020. Prior images  reviewed side by side. Improvement in LVEF.  Echo  04/25/20 1. Left ventricular ejection fraction, by estimation, is 60 to 65%. The left ventricle has normal function. The left ventricle has no regional wall motion abnormalities. Left ventricular diastolic parameters are consistent with Grade I diastolic dysfunction (impaired relaxation). 2. Right ventricular systolic function is normal. The right ventricular size is normal. There is normal pulmonary artery systolic pressure. The estimated right ventricular systolic pressure is 28.0 mmHg. 3. The mitral valve is normal in structure. Trivial mitral valve regurgitation. No evidence of mitral stenosis. 4. The aortic valve is tricuspid. Aortic valve regurgitation is not visualized. Mild aortic valve sclerosis is present, with no evidence of aortic valve stenosis. 5. Aortic dilatation noted. There is mild dilatation of the aortic root, measuring 38 mm. 6. The inferior vena cava is normal in size with greater than 50% respiratory variability, suggesting right atrial pressure of 3 mmHg.   Cardiac cath   04/25/20  Mid RCA lesion is 20% stenosed. Mid LM to Dist LM lesion is 50% stenosed. Ost LAD to Prox LAD lesion is 80% stenosed. Ost Cx to Prox Cx  lesion is 50% stenosed. The left ventricular systolic function is normal. LV end diastolic pressure is normal. The left ventricular ejection fraction is 55-65% by visual estimate. There is no mitral valve regurgitation.   1. Moderate distal left main stenosis 2. Severe disease in the ostium of the large caliber LAD extending into the proximal vessel.  3. Moderate stenosis in the ostium of the large caliber Circumflex artery. The small to moderate caliber intermediate vessel has moderate proximal disease.  4. Mild non-obstructive disease in the mid RCA 5. Normal LV systolic function  R heart cath:   RA 0   RV 28/2  Mean PAP 13   PCWP 2       Lipid Panel    Component Value Date/Time   CHOL 134 06/13/2021 1400   TRIG 160 (H) 06/13/2021 1400   HDL 48 06/13/2021 1400   CHOLHDL 2.8 06/13/2021 1400   CHOLHDL 4.0 04/27/2020 0306   VLDL 27 04/27/2020 0306   LDLCALC 59 06/13/2021 1400      Wt Readings from Last 3 Encounters:  04/30/23 171 lb 12.8 oz (77.9 kg)  02/05/23 165 lb 12.8 oz (75.2 kg)  10/09/22 156 lb 3.2 oz (70.9 kg)      ASSESSMENT AND PLAN:   1 CAD   PT s/p CABG in 2022  No symptoms worrisome for angina   Rx risk factors   2  HFmrEF  Last echo LVEF 45 to 50%   OVerall volume status is good     3 HTN  BP is a little high but she is upset    PT complaining of hair loss   Wants to come off of b blocker   Will plan to taper    to off over the next 2 wks    PT to keep track of BP   If high at home message in   4  Pulmonary Follows in pulmonary for possible hypersensitivity pneumonitis    5 Afib   Only occurrence was post CABG  No recurrence   6  HL    LDL 59  HDL 48      Continue statin          Plan:  Check  lipomed, A1C, CBC, ESR and Vit D     PT to follow BP, communicate if elevated   Follow up in the fall   Current medicines are reviewed at length with the patient today.  The patient does not have concerns regarding medicines.  Signed, Vina Gull, MD   05/01/2023 11:33 PM    [facility] [address] [place], [place], [place]  [address] Phone: [phone]; Fax: [phone]

## 2023-04-30 ENCOUNTER — Ambulatory Visit: Payer: Medicare Other | Attending: Internal Medicine | Admitting: Internal Medicine

## 2023-04-30 VITALS — BP 142/76 | HR 54 | Ht 60.0 in | Wt 171.8 lb

## 2023-04-30 DIAGNOSIS — E782 Mixed hyperlipidemia: Secondary | ICD-10-CM | POA: Diagnosis present

## 2023-04-30 DIAGNOSIS — I480 Paroxysmal atrial fibrillation: Secondary | ICD-10-CM | POA: Insufficient documentation

## 2023-04-30 DIAGNOSIS — Z951 Presence of aortocoronary bypass graft: Secondary | ICD-10-CM | POA: Diagnosis present

## 2023-04-30 DIAGNOSIS — Z79899 Other long term (current) drug therapy: Secondary | ICD-10-CM | POA: Insufficient documentation

## 2023-04-30 DIAGNOSIS — I2510 Atherosclerotic heart disease of native coronary artery without angina pectoris: Secondary | ICD-10-CM | POA: Diagnosis present

## 2023-04-30 NOTE — Patient Instructions (Signed)
 Medication Instructions:  DECREASE METOPROLOL  TO 12.5 MG DAILY FOR 3 DAYS, THEN EVERY OTHER DAY FOR A WEEK, THEN STOP   *If you need a refill on your cardiac medications before your next appointment, please call your pharmacy*   Lab Work: VIT D, NMR, HGBA1C, CBC, ESR If you have labs (blood work) drawn today and your tests are completely normal, you will receive your results only by: MyChart Message (if you have MyChart) OR A paper copy in the mail If you have any lab test that is abnormal or we need to change your treatment, we will call you to review the results.   Testing/Procedures:    Follow-Up: At [facility], you and your health needs are our priority.  As part of our continuing mission to provide you with exceptional heart care, we have created designated Provider Care Teams.  These Care Teams include your primary Cardiologist (physician) and Advanced Practice Providers (APPs -  Physician Assistants and Nurse Practitioners) who all work together to provide you with the care you need, when you need it.  We recommend signing up for the patient portal called MyChart.  Sign up information is provided on this After Visit Summary.  MyChart is used to connect with patients for Virtual Visits (Telemedicine).  Patients are able to view lab/test results, encounter notes, upcoming appointments, etc.  Non-urgent messages can be sent to your provider as well.   To learn more about what you can do with MyChart, go to [email or web address].    Your next appointment:  FALL 2025

## 2023-05-01 LAB — HEMOGLOBIN A1C
Est. average glucose Bld gHb Est-mCnc: 131 mg/dL
Hgb A1c MFr Bld: 6.2 % — ABNORMAL HIGH (ref 4.8–5.6)

## 2023-05-01 LAB — CBC
Hematocrit: 37.4 % (ref 34.0–46.6)
Hemoglobin: 11.8 g/dL (ref 11.1–15.9)
MCH: 30.5 pg (ref 26.6–33.0)
MCHC: 31.6 g/dL (ref 31.5–35.7)
MCV: 97 fL (ref 79–97)
Platelets: 261 10*3/uL (ref 150–450)
RBC: 3.87 x10E6/uL (ref 3.77–5.28)
RDW: 12.9 % (ref 11.7–15.4)
WBC: 9 10*3/uL (ref 3.4–10.8)

## 2023-05-01 LAB — NMR, LIPOPROFILE
Cholesterol, Total: 132 mg/dL (ref 100–199)
HDL Particle Number: 37.6 umol/L (ref >=30.5)
HDL-C: 54 mg/dL (ref >39)
LDL Particle Number: 752 nmol/L (ref <1000)
LDL Size: 20.7 nmol (ref >20.5)
LDL-C (NIH Calc): 59 mg/dL (ref 0–99)
LP-IR Score: 59 — ABNORMAL HIGH (ref <=45)
Small LDL Particle Number: 417 nmol/L (ref <=527)
Triglycerides: 105 mg/dL (ref 0–149)

## 2023-05-01 LAB — SEDIMENTATION RATE: Sed Rate: 5 mm/h (ref 0–40)

## 2023-05-01 LAB — VITAMIN D 25 HYDROXY (VIT D DEFICIENCY, FRACTURES): Vit D, 25-Hydroxy: 32.9 ng/mL (ref 30.0–100.0)

## 2023-05-02 ENCOUNTER — Encounter: Payer: Self-pay | Admitting: Internal Medicine

## 2023-05-03 ENCOUNTER — Other Ambulatory Visit: Payer: Self-pay

## 2023-05-03 MED ORDER — SM VITAMIN D3 100 MCG (4000 UT) PO CAPS: 4000.0000 [IU] | ORAL_CAPSULE | Freq: Every day | ORAL | 3 refills | Status: AC

## 2023-06-03 ENCOUNTER — Other Ambulatory Visit: Payer: Self-pay

## 2023-06-03 MED ORDER — ROSUVASTATIN CALCIUM 20 MG PO TABS: ORAL_TABLET | ORAL | 3 refills | Status: AC

## 2023-07-01 ENCOUNTER — Other Ambulatory Visit: Payer: Self-pay

## 2023-07-01 MED ORDER — ENTRESTO 24-26 MG PO TABS: 1.0000 | ORAL_TABLET | Freq: Two times a day (BID) | ORAL | 3 refills | Status: AC

## 2024-02-10 ENCOUNTER — Encounter: Payer: Self-pay | Admitting: Internal Medicine

## 2024-02-12 NOTE — Telephone Encounter (Signed)
 pLease help [name] set up Calcium  score CT Also, if she has not had fasting lipids in awhile I would have her set up for NMR panel, Lpa, Apo B and Hgb A1C    Or if she has had them within 1 year have her get copy of lab results for review  I will set the pt to be seen in clinic after CT

## 2024-02-13 NOTE — Telephone Encounter (Signed)
 Left message for patient to callback or reply via MyChart.
